# Patient Record
Sex: Female | Born: 1955
Health system: Southern US, Community
[De-identification: ages and names within clinical notes are randomized; demographics above are authoritative.]

## PROBLEM LIST (undated history)

## (undated) DIAGNOSIS — N951 Menopausal and female climacteric states: Secondary | ICD-10-CM

## (undated) DIAGNOSIS — R51 Headache: Secondary | ICD-10-CM

## (undated) DIAGNOSIS — J309 Allergic rhinitis, unspecified: Secondary | ICD-10-CM

## (undated) DIAGNOSIS — E785 Hyperlipidemia, unspecified: Secondary | ICD-10-CM

## (undated) DIAGNOSIS — M1990 Unspecified osteoarthritis, unspecified site: Secondary | ICD-10-CM

## (undated) DIAGNOSIS — E282 Polycystic ovarian syndrome: Secondary | ICD-10-CM

## (undated) DIAGNOSIS — C801 Malignant (primary) neoplasm, unspecified: Secondary | ICD-10-CM

## (undated) DIAGNOSIS — R609 Edema, unspecified: Secondary | ICD-10-CM

## (undated) DIAGNOSIS — T8859XA Other complications of anesthesia, initial encounter: Secondary | ICD-10-CM

## (undated) DIAGNOSIS — R112 Nausea with vomiting, unspecified: Secondary | ICD-10-CM

## (undated) DIAGNOSIS — R7303 Prediabetes: Secondary | ICD-10-CM

## (undated) DIAGNOSIS — R519 Headache, unspecified: Secondary | ICD-10-CM

## (undated) DIAGNOSIS — Z9889 Other specified postprocedural states: Secondary | ICD-10-CM

## (undated) DIAGNOSIS — I10 Essential (primary) hypertension: Secondary | ICD-10-CM

## (undated) DIAGNOSIS — E039 Hypothyroidism, unspecified: Secondary | ICD-10-CM

## (undated) DIAGNOSIS — F419 Anxiety disorder, unspecified: Secondary | ICD-10-CM

## (undated) DIAGNOSIS — T4145XA Adverse effect of unspecified anesthetic, initial encounter: Secondary | ICD-10-CM

## (undated) DIAGNOSIS — Z973 Presence of spectacles and contact lenses: Secondary | ICD-10-CM

## (undated) DIAGNOSIS — L719 Rosacea, unspecified: Secondary | ICD-10-CM

## (undated) HISTORY — DX: Hyperlipidemia, unspecified: E78.5

## (undated) HISTORY — PX: TONSILLECTOMY: SHX5217

## (undated) HISTORY — DX: Essential (primary) hypertension: I10

## (undated) HISTORY — DX: Anxiety disorder, unspecified: F41.9

## (undated) HISTORY — DX: Edema, unspecified: R60.9

## (undated) HISTORY — PX: FERTILITY SURGERY: SHX945

## (undated) HISTORY — PX: COLONOSCOPY: SHX174

## (undated) HISTORY — DX: Allergic rhinitis, unspecified: J30.9

## (undated) HISTORY — DX: Polycystic ovarian syndrome: E28.2

## (undated) HISTORY — PX: MOHS SURGERY: SUR867

## (undated) HISTORY — DX: Menopausal and female climacteric states: N95.1

---

## 1997-05-20 ENCOUNTER — Other Ambulatory Visit: Admission: RE | Admit: 1997-05-20 | Discharge: 1997-05-20 | Payer: Self-pay | Admitting: Obstetrics and Gynecology

## 1997-05-28 ENCOUNTER — Other Ambulatory Visit: Admission: RE | Admit: 1997-05-28 | Discharge: 1997-05-28 | Payer: Self-pay | Admitting: Obstetrics and Gynecology

## 1997-08-11 ENCOUNTER — Encounter: Admission: RE | Admit: 1997-08-11 | Discharge: 1997-11-09 | Payer: Self-pay | Admitting: Internal Medicine

## 1998-09-20 ENCOUNTER — Other Ambulatory Visit: Admission: RE | Admit: 1998-09-20 | Discharge: 1998-09-20 | Payer: Self-pay | Admitting: Obstetrics and Gynecology

## 1998-11-09 ENCOUNTER — Encounter: Admission: RE | Admit: 1998-11-09 | Discharge: 1998-11-09 | Payer: Self-pay | Admitting: Obstetrics and Gynecology

## 1998-11-09 ENCOUNTER — Encounter: Payer: Self-pay | Admitting: Obstetrics and Gynecology

## 1999-09-30 ENCOUNTER — Other Ambulatory Visit: Admission: RE | Admit: 1999-09-30 | Discharge: 1999-09-30 | Payer: Self-pay | Admitting: Obstetrics and Gynecology

## 1999-10-12 ENCOUNTER — Encounter: Admission: RE | Admit: 1999-10-12 | Discharge: 2000-01-10 | Payer: Self-pay | Admitting: Obstetrics and Gynecology

## 1999-11-15 ENCOUNTER — Encounter: Payer: Self-pay | Admitting: Obstetrics and Gynecology

## 1999-11-15 ENCOUNTER — Encounter: Admission: RE | Admit: 1999-11-15 | Discharge: 1999-11-15 | Payer: Self-pay | Admitting: Obstetrics and Gynecology

## 2000-11-08 ENCOUNTER — Other Ambulatory Visit: Admission: RE | Admit: 2000-11-08 | Discharge: 2000-11-08 | Payer: Self-pay | Admitting: Internal Medicine

## 2000-11-30 ENCOUNTER — Encounter: Admission: RE | Admit: 2000-11-30 | Discharge: 2000-11-30 | Payer: Self-pay | Admitting: Internal Medicine

## 2000-11-30 ENCOUNTER — Encounter: Payer: Self-pay | Admitting: Internal Medicine

## 2002-02-17 ENCOUNTER — Encounter: Payer: Self-pay | Admitting: Internal Medicine

## 2002-02-17 ENCOUNTER — Encounter: Admission: RE | Admit: 2002-02-17 | Discharge: 2002-02-17 | Payer: Self-pay | Admitting: Internal Medicine

## 2005-03-27 ENCOUNTER — Other Ambulatory Visit: Admission: RE | Admit: 2005-03-27 | Discharge: 2005-03-27 | Payer: Self-pay | Admitting: Family Medicine

## 2006-11-04 ENCOUNTER — Emergency Department (HOSPITAL_COMMUNITY): Admission: EM | Admit: 2006-11-04 | Discharge: 2006-11-04 | Payer: Self-pay | Admitting: Family Medicine

## 2006-11-06 ENCOUNTER — Emergency Department (HOSPITAL_COMMUNITY): Admission: EM | Admit: 2006-11-06 | Discharge: 2006-11-06 | Payer: Self-pay | Admitting: Emergency Medicine

## 2008-08-25 ENCOUNTER — Other Ambulatory Visit: Admission: RE | Admit: 2008-08-25 | Discharge: 2008-08-25 | Payer: Self-pay | Admitting: Family Medicine

## 2010-01-30 ENCOUNTER — Encounter: Payer: Self-pay | Admitting: Internal Medicine

## 2010-01-31 ENCOUNTER — Encounter: Payer: Self-pay | Admitting: Family Medicine

## 2010-09-05 ENCOUNTER — Other Ambulatory Visit: Payer: Self-pay | Admitting: Family Medicine

## 2010-09-05 ENCOUNTER — Other Ambulatory Visit (HOSPITAL_COMMUNITY)
Admission: RE | Admit: 2010-09-05 | Discharge: 2010-09-05 | Disposition: A | Discharge status: Home / Self Care | Payer: BC Managed Care – PPO | Source: Ambulatory Visit | Attending: Family Medicine | Admitting: Family Medicine

## 2010-09-05 DIAGNOSIS — Z124 Encounter for screening for malignant neoplasm of cervix: Secondary | ICD-10-CM | POA: Insufficient documentation

## 2010-09-05 DIAGNOSIS — Z1159 Encounter for screening for other viral diseases: Secondary | ICD-10-CM | POA: Insufficient documentation

## 2011-04-26 ENCOUNTER — Other Ambulatory Visit: Payer: Self-pay | Admitting: Dermatology

## 2011-05-29 ENCOUNTER — Other Ambulatory Visit: Payer: Self-pay | Admitting: Dermatology

## 2013-02-05 ENCOUNTER — Encounter: Payer: BC Managed Care – PPO | Attending: Endocrinology | Admitting: Dietician

## 2013-02-05 ENCOUNTER — Encounter: Payer: Self-pay | Admitting: Dietician

## 2013-02-05 VITALS — Ht 68.0 in | Wt 192.2 lb

## 2013-02-05 DIAGNOSIS — Z713 Dietary counseling and surveillance: Secondary | ICD-10-CM | POA: Insufficient documentation

## 2013-02-05 DIAGNOSIS — E663 Overweight: Secondary | ICD-10-CM | POA: Insufficient documentation

## 2013-02-05 NOTE — Progress Notes (Signed)
Medical Nutrition Therapy:  Appt start time: 8563 end time:  1200.  Assessment:  Primary concerns today: Sarah Blackwell is here today since her endocrinologist recommended that she see a dietitian. Has hypothyroidism and PCOS. States that she is "so tired" of extra weight. Has lost almost 10 lbs since the end of November and is currently at plateau for the past couple of weeks. Since November started doing regular exercise every day (45-90 minutes per day) and has a Physiological scientist she sees once per week. Made changes to diet such as stopped eating ice cream at night, avoids red meat most of the time, tried to stop eat as much bread and starches, and stopped drinking milk.  States that she has been in the pre-diabetes range off and on for years. Current Hgb A1c% is 5.6%. Has trouble getting below 189 lbs and gets discouraged when she works hard and doesn't lose more weight. Heaviest weight is about 205 lbs.   Is an "empty nester" and husband is working is [place] and she will visit at that time. Goes out to eat on the weekends about 10 times per week. Drinks wine during the week. States that she has a lot of stress in her life at this time.    Weight loss goal is 150 lbs or size 10.   Preferred Learning Style:   No preference indicated   Learning Readiness:   Ready   MEDICATIONS: see list   DIETARY INTAKE:  Avoided foods include red meat  24-hr recall:  B ( AM): 2-3 cups of coffee with cream and eggs with [place] bacon, Chobani Greek yogurt with granola, or fiber one and almond milk  Snk ( AM): none  L ( PM): Chobani yogurt or salad with pita bread or chicken salad with no bread or chicken and veggies with water or 1/2 sweet or unsweet tea with splenda Snk ( PM): fruit sometimes or cheese and crackers or skinny pop or avocado D ( PM):chicken/fish with vegetables or risotto/rice/sweet potato if goes out to dinner, sometimes pasta Snk ( PM): none usually, sometimes yogurt or  popcorn Beverages: water, coffee, sometimes tea, wine  Usual physical activity: exercise every day (45-90 minutes per day) and has a Physiological scientist she sees once per week.  Estimated energy needs: 1600 calories 180 g carbohydrates 120 g protein 44 g fat  Progress Towards Goal(s):  In progress.   Nutritional Diagnosis:  [place]-3.3 Overweight/obesity As related to history of large portion sizes and frequent restaurant meals.  As evidenced by BMI of 29.2.    Intervention:  Nutrition counseling provided. Discussed finding way to reduce stress to help with weight management. Also discussed counting carbohydrates to help manage portions and keep blood sugar from becoming elevated.   Plan: Aim to get 30-45 g carbohydrates at meals and up to 15 g at snacks if needed. For snacks and meals make sure to have protein with carbs. Start paying attention to hunger/fullness cues and try eat when you are just starting to get hungry. Try to eat meals at the table with no distractions (try to eat slowly). Consider exercising less per week if you have pain or it causes more stress.  Think about trying to incorporate more sleep if possible. Think about way to reduce stress if possible - schedule a massage!  Teaching Method Utilized:  Visual Auditory Hands on  Handouts given during visit include:  MyPlate Handout  15 g CHO Snacks  Yellow Card  Barriers to learning/adherence to lifestyle  change: busy schedule, stress  Demonstrated degree of understanding via:  Teach Back   Monitoring/Evaluation:  Dietary intake, exercise, mindful eating, and body weight prn.

## 2013-02-05 NOTE — Patient Instructions (Addendum)
Aim to get 30-45 g carbohydrates at meals and up to 15 g at snacks if needed. For snacks and meals make sure to have protein with carbs. Start paying attention to hunger/fullness cues and try eat when you are just starting to get hungry. Try to eat meals at the table with no distractions (try to eat slowly). Consider exercising less per week if you have pain or it causes more stress.  Think about trying to incorporate more sleep if possible. Think about way to reduce stress if possible - schedule a massage!

## 2013-08-25 ENCOUNTER — Encounter: Payer: Self-pay | Admitting: *Deleted

## 2014-01-20 ENCOUNTER — Other Ambulatory Visit: Payer: Self-pay | Admitting: Sports Medicine

## 2014-01-28 ENCOUNTER — Encounter: Payer: Self-pay | Admitting: Sports Medicine

## 2014-01-28 ENCOUNTER — Ambulatory Visit (INDEPENDENT_AMBULATORY_CARE_PROVIDER_SITE_OTHER): Payer: BLUE CROSS/BLUE SHIELD | Admitting: Sports Medicine

## 2014-01-28 ENCOUNTER — Ambulatory Visit
Admission: RE | Admit: 2014-01-28 | Discharge: 2014-01-28 | Disposition: A | Discharge status: Home / Self Care | Payer: BLUE CROSS/BLUE SHIELD | Source: Ambulatory Visit | Attending: Sports Medicine | Admitting: Sports Medicine

## 2014-01-28 VITALS — BP 117/75 | HR 76 | Ht 68.0 in | Wt 192.0 lb

## 2014-01-28 DIAGNOSIS — G8929 Other chronic pain: Secondary | ICD-10-CM | POA: Diagnosis not present

## 2014-01-28 DIAGNOSIS — M1611 Unilateral primary osteoarthritis, right hip: Secondary | ICD-10-CM | POA: Diagnosis not present

## 2014-01-28 DIAGNOSIS — M25551 Pain in right hip: Secondary | ICD-10-CM

## 2014-01-28 MED ORDER — TRAMADOL HCL 50 MG PO TABS: ORAL_TABLET | ORAL | Status: DC

## 2014-01-28 MED ORDER — AMITRIPTYLINE HCL 25 MG PO TABS: 25.0000 mg | ORAL_TABLET | Freq: Every day | ORAL | Status: DC

## 2014-01-28 NOTE — Assessment & Plan Note (Signed)
Consider options of CSI  Modify PT  Modify activity

## 2014-01-28 NOTE — Progress Notes (Signed)
Subjective:    Patient ID: Sarah Blackwell, female    DOB: [birth year], 57 y.o.   MRN: [id number]  HPI Sarah Blackwell is a 59 yo female who presents with right hip and leg pain, as well as left-sided low back pain.   The right hip and leg pain is described as involving the lateral and anterior hip region, the lateral thigh, lateral and inferior knee. The pain has been present for the past year intermittently, with acute worsening over the past three months. The pain is described as a tightness at times, throbbing at times, and ache at times. She has been working with Dr. Belia Heman performing PT prior to her appointment today. She has not noticed any improvement after PT, but does report that placing traction on her right leg seemed to increase the associated range of motion and decrease the pain. The pain is described as worsening with activity, including running, playing tennis, or snow skiing. The pain is improved slightly by placing pillows under her knees while sleeping at night. The pain is present at night and is described as a "throbbing" pain waking her from sleep at times. She reports subjective decreased ROM and weakness of her right leg. No instability. No numbness or tingling down into her feet. No recent injuries or surgeries, although she was a Therapist, sports in high school.   The left-sided low back pain started this morning while she was getting ready. She was standing at her bathroom sink and reports hearing a "popping" sound. Since that time she reports pain along the SI region and difficulty standing up straight. No radiation of the pain or associated numbness or tingling into the left hip or leg.    Review of Systems Negative other than noted in HPI.     Objective:   Physical Exam Vitals: BP 117/75 P76 General: well-appearing, pleasant female in no acute distress; she does appear uncomfortable with sitting  Back:  Inspection: vertebrate are midline; right SI joint is slightly  higher than left Palpation: mild tenderness to palpation along the left SI joint ROM: full ROM with flexion; the rest of the exam was limited by pain Special: negative straight leg raise bilaterally Neurovascular: neurovascularly intact distally; trace to 0 patellar and Achilles reflexes bilaterally  Lower Extremity:  Inspection: no soft tissue swelling, effusions, discoloration, or deformity; right hip appears internally rotated with walking; right pseudo leg-length discrepancy of approximately 0.5cm Palpation: mild tenderness to palpation along the left SI joint; mild but non-focal tenderness over the right greater trochanter ROM: internal rotation of right hip decreased to approximately 15 degrees, internal rotation of left hip approximately 30 degrees; right hip flexion limited to approximately 90 degrees, left hip flexion of approximately 110 degrees; full ROM at knee Special: positive FABER on rt, positive FADIR on rt, negative straight leg raise bilaterally, negative log roll bilaterally Neurovascular: neurovascularly intact distally; trace to 0 patellar and Achilles reflexes bilaterally     Assessment & Plan:   1. Right hip/leg pain:  Given the severity of the pain, location involving the anterior and lateral hip and decreased ROM with internal rotation and flexion at the hip, positive FABERs and FADIR there is concern for possible intra-articular pathology, such as osteoarthritis of the right hip. The pain that radiates down to the knee is like referred pain. There is possibly some mild bursitis contributing to the lateral hip pain, but this is not likely the sole cause of the pain.   - Will get hip radiographs to  assess for bony, intra-articular abnormalities and will call pt with results - Prescribed Tramadol 50mg  1-2 tablets up to 4 times per day for pain - Will start amitriptyline 25 mg 1 tablet at bedtime to help with sleep and to relax the muscles surrounding the hip  XRAYS  completed after OV.  These show moderate OA on RT with loss of joint space/  Mild OA on left  2. Left lower back pain:  The description of this pain starting acutely today, coupled with her current gait abnormality due to the right hip and leg pain make these symptoms most consistent with a popping of the SI joint secondary to the internal rotation occuring at her right hip.   - Will continue with plan as above  Follow-up in 4 weeks or sooner based on patient need or radiographic results. May need to perform ultrasound at next appointment.    This patient was seen with DR Oneida Alar and note was dictated by Crissie Sickles, MS4 Note I added XRAY results and edited note.  If she does not respond to tramadol and amitriptyline we may need to try CSI.  Ila Mcgill, MD

## 2014-01-28 NOTE — Assessment & Plan Note (Signed)
We will add tramadol and amitriptyline  Not much relief with 2 Aleve bid

## 2014-02-02 ENCOUNTER — Telehealth: Payer: Self-pay | Admitting: *Deleted

## 2014-02-02 NOTE — Telephone Encounter (Signed)
Pt called with questions about meds. Said the amitriptyline and tramadol is making her very drowsy/sleepy.  Also still has the pain and affects her ability to walk properly.  I recommended to take half the amitriptyline and to take the tramadol at night. Will check with Dr. Oneida Alar about what to suggest further.  Pt wants call back tmw if possible.

## 2014-02-17 ENCOUNTER — Other Ambulatory Visit: Payer: Self-pay | Admitting: Family Medicine

## 2014-02-17 DIAGNOSIS — N950 Postmenopausal bleeding: Secondary | ICD-10-CM

## 2014-02-19 ENCOUNTER — Ambulatory Visit
Admission: RE | Admit: 2014-02-19 | Discharge: 2014-02-19 | Disposition: A | Discharge status: Home / Self Care | Payer: BLUE CROSS/BLUE SHIELD | Source: Ambulatory Visit | Attending: Family Medicine | Admitting: Family Medicine

## 2014-02-19 DIAGNOSIS — N950 Postmenopausal bleeding: Secondary | ICD-10-CM

## 2014-02-24 ENCOUNTER — Ambulatory Visit: Payer: BLUE CROSS/BLUE SHIELD | Admitting: Sports Medicine

## 2014-02-26 ENCOUNTER — Ambulatory Visit: Payer: BLUE CROSS/BLUE SHIELD | Admitting: Sports Medicine

## 2014-03-10 ENCOUNTER — Encounter: Payer: Self-pay | Admitting: Sports Medicine

## 2014-03-10 ENCOUNTER — Ambulatory Visit (INDEPENDENT_AMBULATORY_CARE_PROVIDER_SITE_OTHER): Payer: BLUE CROSS/BLUE SHIELD | Admitting: Sports Medicine

## 2014-03-10 ENCOUNTER — Other Ambulatory Visit: Payer: Self-pay | Admitting: Sports Medicine

## 2014-03-10 VITALS — BP 129/89 | Ht 68.0 in | Wt 195.0 lb

## 2014-03-10 DIAGNOSIS — G8929 Other chronic pain: Secondary | ICD-10-CM | POA: Diagnosis not present

## 2014-03-10 DIAGNOSIS — M25551 Pain in right hip: Secondary | ICD-10-CM

## 2014-03-10 DIAGNOSIS — M1611 Unilateral primary osteoarthritis, right hip: Secondary | ICD-10-CM

## 2014-03-10 NOTE — Assessment & Plan Note (Signed)
Moderate to severe with subchondral cyst. -Referred for Fluoroscopy guided CST right hip injection -Continue HEP -Continue amitriptyline 25mg  -Plan f/u in 4-6 weeks for re-evaluation -If sx do not temporarily improve following hip CST injection, consider looking at lumbar spine as potential etiology of patient's symptoms.

## 2014-03-10 NOTE — Progress Notes (Signed)
   Subjective:    Patient ID: Sarah Blackwell, female    DOB: May 13, 1955, 59 y.o.   MRN: [id number]  HPI [name] is a 59 yo female who presents with right hip pain. She was last seen by Dr. Oneida Alar on 01/28/14 and started on a home exercise program and amitriptyline 25mg  po qHS. She says her sx have persisted, located in the groin and deep right gluteal region. Also she is having some mild right-sided low back pain with some radicular symptoms to the right knee. Onset was over a year ago. She enjoys playing tennis but has been limited due to the hip pain. No known hx of acute injury. Character is throbbing deep pain. No catching or mechanical symptoms. Worse with right leg abduction feeling like a "pinching" in the right hip. The pain is improved slightly by placing pillows under her knees while sleeping at night. She reports subjective decreased ROM and weakness of her right leg. No instability. No numbness or tingling down into her feet.   X-rays on 01/24/14: Moderate Right greater than Left hip OA, subchondral cyst in right femoral-acetabular joint.  Past medical history, social history, medications, and allergies were reviewed and are up to date in the chart.  Review of Systems 7 point review of systems was performed and was otherwise negative unless noted in the history of present illness.     Objective:   Physical Exam BP 129/89 mmHg  Ht 5\' 8"  (1.727 m)  Wt 195 lb (88.451 kg)  BMI 29.66 kg/m2 GEN: The patient is well-developed well-nourished female and in no acute distress.  She is awake alert and oriented x3. SKIN: warm and well-perfused, no rash  EXTR: No lower extremity edema or calf tenderness Neuro: Strength 5/5 globally. Sensation intact throughout. No focal deficits. Vasc: +2 bilateral distal pulses. No edema.  MSK: Examination of the right hip reveals limited internal ROM to about 20 degrees with significant reproduction of pain at the end point of motion. Left hip internal  ROM is to about 30 degrees without pain. No catching or popping. No leg length discrepancy. +Faber right hip. Tone and strength intact in the right lower extremity. Negative pelvic compression test.    Assessment & Plan:  Please see problem based assessment and plan in the problem list.

## 2014-03-11 ENCOUNTER — Ambulatory Visit
Admission: RE | Admit: 2014-03-11 | Discharge: 2014-03-11 | Disposition: A | Discharge status: Home / Self Care | Payer: BLUE CROSS/BLUE SHIELD | Source: Ambulatory Visit | Attending: Sports Medicine | Admitting: Sports Medicine

## 2014-03-11 DIAGNOSIS — M25551 Pain in right hip: Secondary | ICD-10-CM

## 2014-03-11 MED ORDER — IOHEXOL 350 MG/ML SOLN
1.0000 mL | Freq: Once | INTRAVENOUS | Status: AC | PRN
  Administered 2014-03-11: 1 mL via INTRA_ARTICULAR

## 2014-03-11 MED ORDER — METHYLPREDNISOLONE ACETATE 40 MG/ML INJ SUSP (RADIOLOG
120.0000 mg | Freq: Once | INTRAMUSCULAR | Status: AC
  Administered 2014-03-11: 120 mg via INTRA_ARTICULAR

## 2014-03-11 NOTE — Discharge Instructions (Signed)
Disc Aspiration/Bone Biopsy Post Procedure Discharge Instructions  1. You may resume a regular diet and any medications that you routinely take (including pain medications). 2. No driving the day of procedure. 3. Upon discharge go home and rest for at least 4 hours.  You may use an ice pack as needed to injection sites on back. 4. Remove bandaids later today.    Please contact our office at [phone] for the following symptoms:   Fever greater than 100 degrees  Increased swelling, pain, or redness at injection site.   Thank you for visiting [facility].

## 2014-03-13 ENCOUNTER — Ambulatory Visit: Payer: BLUE CROSS/BLUE SHIELD | Admitting: Family Medicine

## 2014-04-07 ENCOUNTER — Ambulatory Visit (INDEPENDENT_AMBULATORY_CARE_PROVIDER_SITE_OTHER): Payer: BLUE CROSS/BLUE SHIELD | Admitting: Sports Medicine

## 2014-04-07 ENCOUNTER — Encounter: Payer: Self-pay | Admitting: Sports Medicine

## 2014-04-07 VITALS — BP 140/81 | HR 73 | Ht 68.0 in | Wt 195.0 lb

## 2014-04-07 DIAGNOSIS — M5416 Radiculopathy, lumbar region: Secondary | ICD-10-CM

## 2014-04-07 DIAGNOSIS — M1611 Unilateral primary osteoarthritis, right hip: Secondary | ICD-10-CM

## 2014-04-07 DIAGNOSIS — M5417 Radiculopathy, lumbosacral region: Secondary | ICD-10-CM

## 2014-04-07 MED ORDER — DIAZEPAM 5 MG PO TABS: ORAL_TABLET | ORAL | Status: DC

## 2014-04-08 ENCOUNTER — Ambulatory Visit
Admission: RE | Admit: 2014-04-08 | Discharge: 2014-04-08 | Disposition: A | Discharge status: Home / Self Care | Payer: BLUE CROSS/BLUE SHIELD | Source: Ambulatory Visit | Attending: Sports Medicine | Admitting: Sports Medicine

## 2014-04-08 DIAGNOSIS — M1611 Unilateral primary osteoarthritis, right hip: Secondary | ICD-10-CM

## 2014-04-08 NOTE — Progress Notes (Signed)
  Sarah Blackwell - 59 y.o. female MRN [id number]  Date of birth: March 27, 1955  SUBJECTIVE:  Including CC & ROS.  Sarah Blackwell is a pleasant 59 yo female present today for follow up for persistent right side hip pain, low back and lateral radiating pain.  Patient was seen twice this month for these symptoms. Initially mostly of her pain was in the groin and lateral leg she was thought to be dealing with intra-articular arthritis in the hip, she had xray that only showed mild degenerative changes.  Patient was sent for cortisone injection under floro and reports a great reports initially for the first 48 hours but the pain in her buttock and low back were still present. The pain has gradually worsened since the injection back to the original pain level within a weeks time.  Today she continue to describes a C-shape distribution of pain from the posterior Gluteal fold wrapping lateral down thigh and anterior thigh.  The pain is severe enough that it has cause her to change her gait and makes it difficulty to walk at times. Worse with getting up from a sited position.  The amitriptyline and tramadol have helped some.  Continues to not describe a numbness but a shooting type pain.    ROS: Review of systems otherwise negative except for information present in HPI  HISTORY: Past Medical, Surgical, Social, and Family History Reviewed & Updated per EMR. Pertinent Historical Findings include: Recent hip injection No other changes in medical history since last seen  DATA REVIEWED: Hip xrays review with mild to moderate OA  PHYSICAL EXAM:  VS: BP:140/81 mmHg  HR:73bpm  TEMP: ( )  RESP:   HT:5\' 8"  (172.7 cm)   WT:195 lb (88.451 kg)  BMI:29.7 LOW BACK EXAM: General: well nourished Skin of LE: warm; dry, no rashes, lesions, ecchymosis or erythema. Vascular: radial pulses 2+ bilaterally Neurologically: Sensation to light touch lower extremities equal and intact bilaterally.  Observation: Normal  curvature and no kyphosis or lordosis, no scoliosis.  Iliac crests are symmetric, shoulders line symmetrically Palpation:  No step off defects noted in the thoracic or lumbar spine.   No muscle spasm or tenderness along the paraspinal musculature of the thoracic Moderate muscle tenderness along the paraspinal musculature of the lumbar spine. Range of motion:  Decrease flexion on forward bending, yes pain with extension Neuromuscular: moderate pain with straight leg raise on right, antalagic gait  To pain with hip grip with IR and ER mostly cause buttock pain  Nerve root intervention L1, L2 and L3: 4/5 in hip flexion on right versa 5/5 left L1, L2, L3, L4: 4/5 hip abduction versa 5/5 left L4, L5, S1: Normal hip adduction bilaterally S1 and S2: Normal ankle plantar-flexion bilaterally.   L5: Normal extensor hallucis longus bilaterally  ASSESSMENT & PLAN: See problem based charting & AVS for pt instructions. Impression: Suspect lumbar degenerative disc disease as the cause of hip and lateral leg pain   Recommendations: - proceed with lumbar xray and MRI given the distribution of pain, poor response to hip injection, radiculopathy in right leg, and length of symptoms -Restart PT for strengthening of lumbar spine  -Other activities as tolerated  -Continue amitriptyline and tramadol for pain control  We spent greater than 50% of our 30 minute office visit in counseling education regarding the patient current clinical problem, risks and benefits of treatment options, and recommend plans for treatment or further evaluation

## 2014-04-09 ENCOUNTER — Telehealth: Payer: Self-pay | Admitting: Sports Medicine

## 2014-04-09 DIAGNOSIS — M5136 Other intervertebral disc degeneration, lumbar region: Secondary | ICD-10-CM | POA: Insufficient documentation

## 2014-04-09 DIAGNOSIS — M5416 Radiculopathy, lumbar region: Secondary | ICD-10-CM

## 2014-04-09 NOTE — Telephone Encounter (Signed)
Spoke with patient this morning concerning x-ray results and further discussion about back MRI. Advised patient that based on her x-ray results upper lumbar spine she has multilevel degenerative changes particularly concerning is the disc space narrowing at L1-L2 and L2-L3. With the amount of disc space narrowing at these levels there is likely a high likelihood of possible herniation of the disc that is the source of her radicular symptoms by causing compression on the L1-L2 nerve roots. This also is consistent with the distribution of her pain which extends from her low back into her right buttocks, right thigh and around her right hip. -Educated patient that getting a hip MRI at this point is likely not be beneficial given that she had poor improvement from the hip injection, her x-rays of her hip are not as severe as the arthritis in her back, and she is describing more radicular symptoms than arthritic symptoms. -Also educated patient that we are not able to do both hip and lumbar MRI in the same MRI exam. Patient verbalized understanding and will plan to get MRI of her back tomorrow as scheduled. We'll discuss those results over the phone. Informed patient that based on those results will recommend possible ESI versus referral to neurosurgery.

## 2014-04-10 ENCOUNTER — Ambulatory Visit
Admission: RE | Admit: 2014-04-10 | Discharge: 2014-04-10 | Disposition: A | Discharge status: Home / Self Care | Payer: BLUE CROSS/BLUE SHIELD | Source: Ambulatory Visit | Attending: Sports Medicine | Admitting: Sports Medicine

## 2014-04-10 DIAGNOSIS — M1611 Unilateral primary osteoarthritis, right hip: Secondary | ICD-10-CM

## 2014-04-14 ENCOUNTER — Telehealth: Payer: Self-pay | Admitting: Sports Medicine

## 2014-04-14 ENCOUNTER — Ambulatory Visit: Payer: BLUE CROSS/BLUE SHIELD | Admitting: Sports Medicine

## 2014-04-14 DIAGNOSIS — M5136 Other intervertebral disc degeneration, lumbar region: Secondary | ICD-10-CM

## 2014-04-14 DIAGNOSIS — M1611 Unilateral primary osteoarthritis, right hip: Secondary | ICD-10-CM

## 2014-04-14 DIAGNOSIS — M25551 Pain in right hip: Principal | ICD-10-CM

## 2014-04-14 DIAGNOSIS — G8929 Other chronic pain: Secondary | ICD-10-CM

## 2014-04-14 DIAGNOSIS — M5416 Radiculopathy, lumbar region: Secondary | ICD-10-CM

## 2014-04-15 ENCOUNTER — Other Ambulatory Visit: Payer: Self-pay | Admitting: *Deleted

## 2014-04-15 MED ORDER — PREDNISONE 10 MG PO TABS: ORAL_TABLET | ORAL | Status: DC

## 2014-04-15 NOTE — Telephone Encounter (Signed)
Spoke patient over the phone about lumbar MRI, back xray and hip xray results after discussing with Dr. Oneida Alar. She clearly have degenerative changes in both areas. With little improve after hip injection and tramadol for pain. It continues to be difficulty to determine what percentage of pain is coming from the back or the hip. Plan at this point is to due a 10 day prednisone taper and f/u in the office with Dr. Oneida Alar and myself in Tuesday to re-assess. If improve its likely arthritic pain if no improvement we likely need to proceed with hip MRI.

## 2014-04-16 ENCOUNTER — Other Ambulatory Visit: Payer: Self-pay | Admitting: Sports Medicine

## 2014-04-21 ENCOUNTER — Ambulatory Visit (INDEPENDENT_AMBULATORY_CARE_PROVIDER_SITE_OTHER): Payer: BLUE CROSS/BLUE SHIELD | Admitting: Sports Medicine

## 2014-04-21 ENCOUNTER — Encounter: Payer: Self-pay | Admitting: Sports Medicine

## 2014-04-21 VITALS — BP 97/86 | HR 70 | Ht 68.0 in | Wt 195.0 lb

## 2014-04-21 DIAGNOSIS — M1611 Unilateral primary osteoarthritis, right hip: Secondary | ICD-10-CM | POA: Diagnosis not present

## 2014-04-21 MED ORDER — PREDNISONE 10 MG PO TABS: ORAL_TABLET | ORAL | Status: DC

## 2014-04-21 MED ORDER — NAPROXEN 500 MG PO TABS: 500.0000 mg | ORAL_TABLET | Freq: Two times a day (BID) | ORAL | Status: DC

## 2014-04-21 NOTE — Patient Instructions (Addendum)
Start taking prednisone 2 tablets (20 mg) daily in the morning with food. Take this for a total of 10 days then stop. Please call the office to indicate your response to this medication during this 10 day period.  Start naproxen and (similar to Aleve) this is 500 mg tablets by mouth twice a day  Started biking or aerobic work in the pool for additional physical activity.

## 2014-04-22 NOTE — Progress Notes (Signed)
Sarah Blackwell - 59 y.o. female MRN [id number]  Date of birth: 1955-06-21  SUBJECTIVE:  Including CC & ROS.  Sarah Blackwell is a pleasant 59 yo female present today for follow up for persistent right side hip pain, low back and lateral leg  pain.  Patient was seen three time this past month for these symptoms.  Initially mostly of her pain was in the groin and lateral leg she was thought to be dealing with intra-articular arthritis in the hip, she has limited ROM of exam and she had xray that only showed moderate degenerative changes.  Patient was sent for intra-articular right hip cortisone injection under floro and reports a great reports initially for the first 48 hours but over two week the pain gradually worsened back to the original pain level. Patient was seen again by myself and with complaining of  a C-shape distribution of pain from the posterior Gluteal fold wrapping lateral down thigh and anterior thigh. The pain is severe enough that it has cause her to change her gait and makes it difficulty to walk at times.  Given her poor response to hip injection we looking further in to the lumbar spine as the cause of her pain. 2 view lumbar xray showed degenerative changes at L2-L3 and L3-4 which can discribute to the hip and thus was sent for MRI.  Lumbar MRI showed diffuse annular bulse at L2-L3 with bilateral recess encroachment and mild Left L2 nerve root encroachment but no right side.  Patient was sent in a 10day course of prednisone to try to calm down arthritic pain suspected to be coming from the hip.   Today the patient report she only took one day of prednisone due to manic symptoms and she also had rolfing treatment soft tissue manipulation that was very painful for the first 48 hours but has help in the past 3 days.  Continues to localize pain in a C-shape distribution of pain and groin pain. Describes feeling like something sharp is stuck in her hip joint.   ROS: Review of systems  otherwise negative except for information present in HPI  HISTORY: Past Medical, Surgical, Social, and Family History Reviewed & Updated per EMR. Pertinent Historical Findings include: Recent hip injection No other changes in medical history since last seen  DATA REVIEWED: Hip xrays review with mild to moderate OA right greater than left  Lumbar xray: Degenerative disc and facet disease diffusely throughout the lumbar spine. No acute bony abnormality  Lumbar MRI: 1. Diffuse annular bulge, mild osteophytic ridging and facet disease at L2-3 with mild bilateral lateral recess encroachment. There is also mild extra foraminal encroachment on the left L2 nerve root. 2. Severe facet disease in the lower lumbar spine. There may be a the unilateral pars defect on the right at L5.  PHYSICAL EXAM:  VS: BP:97/86 mmHg  HR:70bpm  TEMP: ( )  RESP:   HT:5\' 8"  (172.7 cm)   WT:195 lb (88.451 kg)  BMI:29.7 LOW BACK EXAM: General: well nourished Skin of LE: warm; dry, no rashes, lesions, ecchymosis or erythema. Vascular: radial pulses 2+ bilaterally Neurologically: Sensation to light touch lower extremities equal and intact bilaterally.  Observation: Normal curvature and no kyphosis or lordosis, no scoliosis.  Iliac crests are symmetric, shoulders line symmetrically Range of motion:  Hip ROM: Flex Right - 90/ left-100, IR right -30/ left 45, ER right 15/ left 30 Positive FADIR for pain and limitation and motion Decrease flexion on forward bending, yes pain with extension  Neuromuscular: moderate pain with straight leg raise on right, antalagic gait  To pain with hip grip with IR and ER causing groin and buttock pain  Motor and sensory function intact  ASSESSMENT & PLAN: See problem based charting & AVS for pt instructions. Impression: Right hip OA   Recommendations: - Restart prednisone 20mg  daily for 10 days then stop and call office to notify us of response -After complete prednisone  start Naproxen 500mg  BID for pain and OA control  -Restart PT for strengthening avoiding activities that cause pain -Other activities as tolerated

## 2014-04-26 ENCOUNTER — Other Ambulatory Visit: Payer: BLUE CROSS/BLUE SHIELD

## 2014-05-15 ENCOUNTER — Encounter: Payer: Self-pay | Admitting: Sports Medicine

## 2014-05-15 ENCOUNTER — Ambulatory Visit (INDEPENDENT_AMBULATORY_CARE_PROVIDER_SITE_OTHER): Payer: BLUE CROSS/BLUE SHIELD | Admitting: Sports Medicine

## 2014-05-15 VITALS — BP 123/73 | HR 75 | Ht 68.0 in | Wt 195.0 lb

## 2014-05-15 DIAGNOSIS — M1611 Unilateral primary osteoarthritis, right hip: Secondary | ICD-10-CM | POA: Diagnosis not present

## 2014-05-15 NOTE — Progress Notes (Signed)
Patient ID: Sarah Blackwell, female   DOB: Jan 09, 1956, 59 y.o.   MRN: [id number]  We have been following this patient since January 20 with symptoms that seem to be from right osteoarthritis of the hip This is fairly severe on x-ray She is more limited with walking or standing She does get some pain relief with Naprosyn She gets some relief with tramadol but does not like side effects  She had some low back pain but an MRI of that area did not suggest this to be the primary cause  She comes today for advice about whether to consider surgery or other treatments  Examination Pleasant and in no acute distress BP 123/73 mmHg  Pulse 75  Ht 5\' 8"  (1.727 m)  Wt 195 lb (88.451 kg)  BMI 29.66 kg/m2  Range of motion of the right hip is significantly limited Seated internal rotation is only 5 External rotation of only about 30 Hip flexion becomes painful at 120  Left hip she can flex the knee to her chest She has 25-30 of internal rotation on the left and 40 of external rotation  X-rays show osteoarthritis as noted

## 2014-05-15 NOTE — Assessment & Plan Note (Signed)
Pain and the inability to walk well are. Significant limitations for her  I think she would like to go ahead and see a surgeon for opinion about replacement  I discussed with her the anterior approach which has had rapid recovery in a number of my patients  We will send her to Dr. Ninfa Linden for his opinion and evaluation  I think she is a good candidate for total hip replacement on the right

## 2014-05-15 NOTE — Patient Instructions (Signed)
[facility] Jean Rosenthal, MD Thursday May 12th at 415pm Arrival time is 4pm [address], [place], [place] [address] [phone] Fax: [phone]

## 2014-05-25 ENCOUNTER — Other Ambulatory Visit: Payer: Self-pay | Admitting: Orthopaedic Surgery

## 2014-05-25 DIAGNOSIS — M25551 Pain in right hip: Secondary | ICD-10-CM

## 2014-05-29 ENCOUNTER — Ambulatory Visit
Admission: RE | Admit: 2014-05-29 | Discharge: 2014-05-29 | Disposition: A | Discharge status: Home / Self Care | Payer: BLUE CROSS/BLUE SHIELD | Source: Ambulatory Visit | Attending: Orthopaedic Surgery | Admitting: Orthopaedic Surgery

## 2014-05-29 DIAGNOSIS — M25551 Pain in right hip: Secondary | ICD-10-CM

## 2014-06-15 ENCOUNTER — Other Ambulatory Visit (HOSPITAL_COMMUNITY): Payer: Self-pay | Admitting: Orthopaedic Surgery

## 2014-06-16 ENCOUNTER — Other Ambulatory Visit (HOSPITAL_COMMUNITY): Payer: Self-pay | Admitting: Orthopaedic Surgery

## 2014-06-17 ENCOUNTER — Encounter (HOSPITAL_COMMUNITY)
Admission: RE | Admit: 2014-06-17 | Discharge: 2014-06-17 | Disposition: A | Discharge status: Home / Self Care | Payer: BLUE CROSS/BLUE SHIELD | Source: Ambulatory Visit | Attending: Orthopaedic Surgery | Admitting: Orthopaedic Surgery

## 2014-06-17 ENCOUNTER — Other Ambulatory Visit (HOSPITAL_COMMUNITY): Payer: Self-pay | Admitting: *Deleted

## 2014-06-17 ENCOUNTER — Encounter (HOSPITAL_COMMUNITY): Payer: Self-pay

## 2014-06-17 DIAGNOSIS — M1611 Unilateral primary osteoarthritis, right hip: Secondary | ICD-10-CM | POA: Diagnosis not present

## 2014-06-17 DIAGNOSIS — Z01812 Encounter for preprocedural laboratory examination: Secondary | ICD-10-CM | POA: Insufficient documentation

## 2014-06-17 DIAGNOSIS — R001 Bradycardia, unspecified: Secondary | ICD-10-CM | POA: Insufficient documentation

## 2014-06-17 HISTORY — DX: Rosacea, unspecified: L71.9

## 2014-06-17 HISTORY — DX: Other complications of anesthesia, initial encounter: T88.59XA

## 2014-06-17 HISTORY — DX: Adverse effect of unspecified anesthetic, initial encounter: T41.45XA

## 2014-06-17 HISTORY — DX: Unspecified osteoarthritis, unspecified site: M19.90

## 2014-06-17 HISTORY — DX: Malignant (primary) neoplasm, unspecified: C80.1

## 2014-06-17 HISTORY — DX: Prediabetes: R73.03

## 2014-06-17 HISTORY — DX: Hypothyroidism, unspecified: E03.9

## 2014-06-17 HISTORY — DX: Headache: R51

## 2014-06-17 HISTORY — DX: Headache, unspecified: R51.9

## 2014-06-17 LAB — BASIC METABOLIC PANEL
ANION GAP: 8 (ref 5–15)
BUN: 14 mg/dL (ref 6–20)
CO2: 27 mmol/L (ref 22–32)
Calcium: 9.5 mg/dL (ref 8.9–10.3)
Chloride: 106 mmol/L (ref 101–111)
Creatinine, Ser: 0.7 mg/dL (ref 0.44–1.00)
GFR calc Af Amer: >60 mL/min (ref >60)
GFR calc non Af Amer: >60 mL/min (ref >60)
Glucose, Bld: 109 mg/dL — ABNORMAL HIGH (ref 65–99)
Potassium: 4.7 mmol/L (ref 3.5–5.1)
SODIUM: 141 mmol/L (ref 135–145)

## 2014-06-17 LAB — SURGICAL PCR SCREEN
MRSA, PCR: POSITIVE — AB
Staphylococcus aureus: POSITIVE — AB

## 2014-06-17 LAB — CBC
HCT: 38.3 % (ref 36.0–46.0)
HEMOGLOBIN: 12.9 g/dL (ref 12.0–15.0)
MCH: 30.9 pg (ref 26.0–34.0)
MCHC: 33.7 g/dL (ref 30.0–36.0)
MCV: 91.8 fL (ref 78.0–100.0)
Platelets: 176 10*3/uL (ref 150–400)
RBC: 4.17 MIL/uL (ref 3.87–5.11)
RDW: 12.3 % (ref 11.5–15.5)
WBC: 2.9 10*3/uL — AB (ref 4.0–10.5)

## 2014-06-17 NOTE — Progress Notes (Signed)
Mupirocin Ointment Rx called into [facility] [employer] on [address] for positive PCR of MRSA and Staph. Pt notified and voiced understanding.

## 2014-06-17 NOTE — Progress Notes (Signed)
Called Dr. Abigail Butts [facility] office, pt's PCP, for copy of EKG, stress test and recent Hgb A1C.   Pt did not sign consent form today, she felt that she had not had all her questions answered by Dr. Ninfa Linden. She also understood that she was having the anterior approach for her hip replacement. That was not part of the consent order. Called Dr. Trevor Mace office and spoke with Almedia Balls, she states that they never put "anterior approach" in the consent order.

## 2014-06-17 NOTE — Pre-Procedure Instructions (Signed)
Sarah Blackwell  06/17/2014     Your procedure is scheduled on  Tuesday, June 30, 2014 at 3:00 PM.   Report to [facility] Entrance "A" Admitting Office at 1:00 PM.   Call this number if you have problems the morning of surgery: [phone]   Any questions prior to day of surgery, please call [phone] between 8 & 4 PM.    Remember:  Do not eat food or drink liquids after midnight Monday, 06/29/14.   Take these medicines the morning of surgery with A SIP OF WATER:  Fexofenadine (Allegra), Levothyroxine (Synthroid), Liothyronine (Cytomel), Nasocort nasal inhaler, Xanax - if needed  Stop Fish Oil, Herbal medications and NSAIDS (Naproxen, Aleve, Ibuprofen, etc) 7 days prior to surgery.   Do not wear jewelry, make-up or nail polish.  Do not wear lotions, powders, or perfumes.  You may wear deodorant.  Do not shave 48 hours prior to surgery.    Do not bring valuables to the hospital.  [facility] is not responsible for any belongings or valuables.  Contacts, dentures or bridgework may not be worn into surgery.  Leave your suitcase in the car.  After surgery it may be brought to your room.  For patients admitted to the hospital, discharge time will be determined by your treatment team.  Special instructions:  [facility] - Preparing for Surgery  Before surgery, you can play an important role.  Because skin is not sterile, your skin needs to be as free of germs as possible.  You can reduce the number of germs on you skin by washing with CHG (chlorahexidine gluconate) soap before surgery.  CHG is an antiseptic cleaner which kills germs and bonds with the skin to continue killing germs even after washing.  Please DO NOT use if you have an allergy to CHG or antibacterial soaps.  If your skin becomes reddened/irritated stop using the CHG and inform your nurse when you arrive at Short Stay.  Do not shave (including legs and underarms) for at least 48 hours prior to the first CHG  shower.  You may shave your face.  Please follow these instructions carefully:   1.  Shower with CHG Soap the night before surgery and the                                morning of Surgery.  2.  If you choose to wash your hair, wash your hair first as usual with your       normal shampoo.  3.  After you shampoo, rinse your hair and body thoroughly to remove the                      Shampoo.  4.  Use CHG as you would any other liquid soap.  You can apply chg directly       to the skin and wash gently with scrungie or a clean washcloth.  5.  Apply the CHG Soap to your body ONLY FROM THE NECK DOWN.        Do not use on open wounds or open sores.  Avoid contact with your eyes, ears, mouth and genitals (private parts).  Wash genitals (private parts) with your normal soap.  6.  Wash thoroughly, paying special attention to the area where your surgery        will be performed.  7.  Thoroughly rinse your body  with warm water from the neck down.  8.  DO NOT shower/wash with your normal soap after using and rinsing off       the CHG Soap.  9.  Pat yourself dry with a clean towel.            10.  Wear clean pajamas.            11.  Place clean sheets on your bed the night of your first shower and do not        sleep with pets.  Day of Surgery  Do not apply any lotions the morning of surgery.  Please wear clean clothes to the hospital.    Please read over the following fact sheets that you were given. Pain Booklet, Coughing and Deep Breathing, MRSA Information and Surgical Site Infection Prevention

## 2014-06-29 MED ORDER — CEFAZOLIN SODIUM-DEXTROSE 2-3 GM-% IV SOLR
2.0000 g | INTRAVENOUS | Status: AC
  Administered 2014-06-30: 2 g via INTRAVENOUS
  Filled 2014-06-29: qty 50

## 2014-06-29 NOTE — Progress Notes (Signed)
Patient called and new arrival time of 12 noon.  DA

## 2014-06-30 ENCOUNTER — Inpatient Hospital Stay (HOSPITAL_COMMUNITY): Payer: BLUE CROSS/BLUE SHIELD | Admitting: Anesthesiology

## 2014-06-30 ENCOUNTER — Inpatient Hospital Stay (HOSPITAL_COMMUNITY)
Admission: RE | Admit: 2014-06-30 | Discharge: 2014-07-02 | DRG: 470 | Disposition: A | Discharge status: Home Health | Payer: BLUE CROSS/BLUE SHIELD | Source: Ambulatory Visit | Attending: Orthopaedic Surgery | Admitting: Orthopaedic Surgery

## 2014-06-30 ENCOUNTER — Encounter (HOSPITAL_COMMUNITY): Payer: Self-pay | Admitting: *Deleted

## 2014-06-30 ENCOUNTER — Encounter (HOSPITAL_COMMUNITY)
Admission: RE | Disposition: A | Discharge status: Home Health | Payer: Self-pay | Source: Ambulatory Visit | Attending: Orthopaedic Surgery

## 2014-06-30 ENCOUNTER — Inpatient Hospital Stay (HOSPITAL_COMMUNITY): Payer: BLUE CROSS/BLUE SHIELD

## 2014-06-30 DIAGNOSIS — E785 Hyperlipidemia, unspecified: Secondary | ICD-10-CM | POA: Diagnosis present

## 2014-06-30 DIAGNOSIS — E039 Hypothyroidism, unspecified: Secondary | ICD-10-CM | POA: Diagnosis present

## 2014-06-30 DIAGNOSIS — R7309 Other abnormal glucose: Secondary | ICD-10-CM | POA: Diagnosis present

## 2014-06-30 DIAGNOSIS — M25551 Pain in right hip: Secondary | ICD-10-CM | POA: Diagnosis present

## 2014-06-30 DIAGNOSIS — Z79899 Other long term (current) drug therapy: Secondary | ICD-10-CM

## 2014-06-30 DIAGNOSIS — D62 Acute posthemorrhagic anemia: Secondary | ICD-10-CM | POA: Diagnosis not present

## 2014-06-30 DIAGNOSIS — M1611 Unilateral primary osteoarthritis, right hip: Secondary | ICD-10-CM | POA: Diagnosis present

## 2014-06-30 DIAGNOSIS — Z823 Family history of stroke: Secondary | ICD-10-CM

## 2014-06-30 DIAGNOSIS — L719 Rosacea, unspecified: Secondary | ICD-10-CM | POA: Diagnosis present

## 2014-06-30 DIAGNOSIS — F419 Anxiety disorder, unspecified: Secondary | ICD-10-CM | POA: Diagnosis present

## 2014-06-30 DIAGNOSIS — Z882 Allergy status to sulfonamides status: Secondary | ICD-10-CM | POA: Diagnosis not present

## 2014-06-30 DIAGNOSIS — Z7982 Long term (current) use of aspirin: Secondary | ICD-10-CM | POA: Diagnosis not present

## 2014-06-30 DIAGNOSIS — Z8249 Family history of ischemic heart disease and other diseases of the circulatory system: Secondary | ICD-10-CM

## 2014-06-30 DIAGNOSIS — Z811 Family history of alcohol abuse and dependence: Secondary | ICD-10-CM | POA: Diagnosis not present

## 2014-06-30 DIAGNOSIS — E282 Polycystic ovarian syndrome: Secondary | ICD-10-CM | POA: Diagnosis present

## 2014-06-30 DIAGNOSIS — I10 Essential (primary) hypertension: Secondary | ICD-10-CM | POA: Diagnosis present

## 2014-06-30 DIAGNOSIS — Z96641 Presence of right artificial hip joint: Secondary | ICD-10-CM

## 2014-06-30 DIAGNOSIS — Z419 Encounter for procedure for purposes other than remedying health state, unspecified: Secondary | ICD-10-CM

## 2014-06-30 HISTORY — PX: TOTAL HIP ARTHROPLASTY: SHX124

## 2014-06-30 SURGERY — ARTHROPLASTY, HIP, TOTAL, ANTERIOR APPROACH
Anesthesia: Monitor Anesthesia Care | Site: Hip | Laterality: Right

## 2014-06-30 MED ORDER — ONDANSETRON HCL 4 MG PO TABS: 4.0000 mg | ORAL_TABLET | Freq: Four times a day (QID) | ORAL | Status: DC | PRN

## 2014-06-30 MED ORDER — MIDAZOLAM HCL 5 MG/5ML IJ SOLN
INTRAMUSCULAR | Status: DC | PRN
  Administered 2014-06-30: 2 mg via INTRAVENOUS

## 2014-06-30 MED ORDER — MIDAZOLAM HCL 2 MG/2ML IJ SOLN
INTRAMUSCULAR | Status: AC
  Filled 2014-06-30: qty 2

## 2014-06-30 MED ORDER — DOCUSATE SODIUM 100 MG PO CAPS
100.0000 mg | ORAL_CAPSULE | Freq: Two times a day (BID) | ORAL | Status: DC
  Administered 2014-06-30 – 2014-07-02 (×4): 100 mg via ORAL
  Filled 2014-06-30 (×4): qty 1

## 2014-06-30 MED ORDER — 0.9 % SODIUM CHLORIDE (POUR BTL) OPTIME
TOPICAL | Status: DC | PRN
  Administered 2014-06-30: 1000 mL

## 2014-06-30 MED ORDER — ROCURONIUM BROMIDE 50 MG/5ML IV SOLN
INTRAVENOUS | Status: AC
  Filled 2014-06-30: qty 1

## 2014-06-30 MED ORDER — NEOSTIGMINE METHYLSULFATE 10 MG/10ML IV SOLN
INTRAVENOUS | Status: AC
  Filled 2014-06-30: qty 2

## 2014-06-30 MED ORDER — ONDANSETRON HCL 4 MG/2ML IJ SOLN
4.0000 mg | Freq: Four times a day (QID) | INTRAMUSCULAR | Status: DC | PRN
  Administered 2014-07-01 (×2): 4 mg via INTRAVENOUS
  Filled 2014-06-30 (×2): qty 2

## 2014-06-30 MED ORDER — VANCOMYCIN HCL 1000 MG IV SOLR
1000.0000 mg | INTRAVENOUS | Status: DC | PRN
  Administered 2014-06-30: 1000 mg via INTRAVENOUS

## 2014-06-30 MED ORDER — ATROPINE SULFATE 0.1 MG/ML IJ SOLN
INTRAMUSCULAR | Status: AC
  Filled 2014-06-30: qty 20

## 2014-06-30 MED ORDER — SUCCINYLCHOLINE CHLORIDE 20 MG/ML IJ SOLN
INTRAMUSCULAR | Status: AC
  Filled 2014-06-30: qty 1

## 2014-06-30 MED ORDER — PHENOL 1.4 % MT LIQD: 1.0000 | OROMUCOSAL | Status: DC | PRN

## 2014-06-30 MED ORDER — ONDANSETRON HCL 4 MG/2ML IJ SOLN
INTRAMUSCULAR | Status: DC | PRN
  Administered 2014-06-30: 4 mg via INTRAVENOUS

## 2014-06-30 MED ORDER — GLYCOPYRROLATE 0.2 MG/ML IJ SOLN
INTRAMUSCULAR | Status: AC
  Filled 2014-06-30: qty 2

## 2014-06-30 MED ORDER — LIDOCAINE HCL (CARDIAC) 20 MG/ML IV SOLN
INTRAVENOUS | Status: AC
  Filled 2014-06-30: qty 5

## 2014-06-30 MED ORDER — FENTANYL CITRATE (PF) 250 MCG/5ML IJ SOLN
INTRAMUSCULAR | Status: AC
  Filled 2014-06-30: qty 5

## 2014-06-30 MED ORDER — CALCIUM CARBONATE-VITAMIN D 500-200 MG-UNIT PO TABS
2.0000 | ORAL_TABLET | Freq: Every day | ORAL | Status: DC
  Administered 2014-07-01 – 2014-07-02 (×2): 2 via ORAL
  Filled 2014-06-30 (×2): qty 2

## 2014-06-30 MED ORDER — LIOTHYRONINE SODIUM 5 MCG PO TABS
5.0000 ug | ORAL_TABLET | Freq: Every day | ORAL | Status: DC
  Administered 2014-07-01 – 2014-07-02 (×2): 5 ug via ORAL
  Filled 2014-06-30 (×3): qty 1

## 2014-06-30 MED ORDER — ARTIFICIAL TEARS OP OINT
TOPICAL_OINTMENT | OPHTHALMIC | Status: AC
  Filled 2014-06-30: qty 7

## 2014-06-30 MED ORDER — ACETAMINOPHEN 650 MG RE SUPP: 650.0000 mg | Freq: Four times a day (QID) | RECTAL | Status: DC | PRN

## 2014-06-30 MED ORDER — METOCLOPRAMIDE HCL 5 MG PO TABS: 5.0000 mg | ORAL_TABLET | Freq: Three times a day (TID) | ORAL | Status: DC | PRN

## 2014-06-30 MED ORDER — PROPOFOL 10 MG/ML IV BOLUS
INTRAVENOUS | Status: AC
  Filled 2014-06-30: qty 20

## 2014-06-30 MED ORDER — DEXTROSE 5 % IV SOLN
500.0000 mg | Freq: Four times a day (QID) | INTRAVENOUS | Status: DC | PRN
  Filled 2014-06-30: qty 5

## 2014-06-30 MED ORDER — MENTHOL 3 MG MT LOZG: 1.0000 | LOZENGE | OROMUCOSAL | Status: DC | PRN

## 2014-06-30 MED ORDER — HYDROMORPHONE HCL 1 MG/ML IJ SOLN
INTRAMUSCULAR | Status: AC
  Filled 2014-06-30: qty 2

## 2014-06-30 MED ORDER — SODIUM CHLORIDE 0.9 % IR SOLN
Status: DC | PRN
  Administered 2014-06-30: 3000 mL

## 2014-06-30 MED ORDER — ROCURONIUM BROMIDE 100 MG/10ML IV SOLN
INTRAVENOUS | Status: DC | PRN
  Administered 2014-06-30: 40 mg via INTRAVENOUS

## 2014-06-30 MED ORDER — ONDANSETRON HCL 4 MG/2ML IJ SOLN
INTRAMUSCULAR | Status: AC
  Filled 2014-06-30: qty 2

## 2014-06-30 MED ORDER — ONDANSETRON HCL 4 MG/2ML IJ SOLN: 4.0000 mg | Freq: Once | INTRAMUSCULAR | Status: DC | PRN

## 2014-06-30 MED ORDER — PHENYLEPHRINE HCL 10 MG/ML IJ SOLN
INTRAMUSCULAR | Status: DC | PRN
  Administered 2014-06-30: 40 ug via INTRAVENOUS
  Administered 2014-06-30: 80 ug via INTRAVENOUS

## 2014-06-30 MED ORDER — HYDROMORPHONE HCL 1 MG/ML IJ SOLN
INTRAMUSCULAR | Status: AC
  Filled 2014-06-30: qty 1

## 2014-06-30 MED ORDER — EPHEDRINE SULFATE 50 MG/ML IJ SOLN
INTRAMUSCULAR | Status: DC | PRN
Start: 2014-06-30 — End: 2014-06-30
  Administered 2014-06-30: 5 mg via INTRAVENOUS
  Administered 2014-06-30 (×2): 10 mg via INTRAVENOUS
  Administered 2014-06-30 (×2): 5 mg via INTRAVENOUS

## 2014-06-30 MED ORDER — SODIUM CHLORIDE 0.9 % IV SOLN
1000.0000 mg | INTRAVENOUS | Status: AC
  Administered 2014-06-30: 1000 mg via INTRAVENOUS
  Filled 2014-06-30 (×2): qty 10

## 2014-06-30 MED ORDER — SODIUM CHLORIDE 0.9 % IJ SOLN
INTRAMUSCULAR | Status: AC
  Filled 2014-06-30: qty 20

## 2014-06-30 MED ORDER — METOCLOPRAMIDE HCL 5 MG/ML IJ SOLN: 5.0000 mg | Freq: Three times a day (TID) | INTRAMUSCULAR | Status: DC | PRN

## 2014-06-30 MED ORDER — VANCOMYCIN HCL IN DEXTROSE 1-5 GM/200ML-% IV SOLN
INTRAVENOUS | Status: AC
  Filled 2014-06-30: qty 200

## 2014-06-30 MED ORDER — METHOCARBAMOL 500 MG PO TABS
ORAL_TABLET | ORAL | Status: AC
  Filled 2014-06-30: qty 1

## 2014-06-30 MED ORDER — OXYCODONE HCL 5 MG PO TABS
ORAL_TABLET | ORAL | Status: AC
  Filled 2014-06-30: qty 2

## 2014-06-30 MED ORDER — HEPARIN SODIUM (PORCINE) 1000 UNIT/ML IJ SOLN
INTRAMUSCULAR | Status: AC
  Filled 2014-06-30: qty 1

## 2014-06-30 MED ORDER — OXYCODONE HCL 5 MG PO TABS
5.0000 mg | ORAL_TABLET | ORAL | Status: DC | PRN
  Administered 2014-06-30: 10 mg via ORAL
  Administered 2014-06-30: 5 mg via ORAL
  Administered 2014-07-01 (×3): 10 mg via ORAL
  Administered 2014-07-01: 15 mg via ORAL
  Administered 2014-07-01: 5 mg via ORAL
  Administered 2014-07-02 (×2): 15 mg via ORAL
  Filled 2014-06-30 (×3): qty 3
  Filled 2014-06-30: qty 1
  Filled 2014-06-30: qty 3
  Filled 2014-06-30: qty 2
  Filled 2014-06-30: qty 1
  Filled 2014-06-30 (×2): qty 2

## 2014-06-30 MED ORDER — ALUM & MAG HYDROXIDE-SIMETH 200-200-20 MG/5ML PO SUSP: 30.0000 mL | ORAL | Status: DC | PRN

## 2014-06-30 MED ORDER — CALCIUM-MAGNESIUM-VITAMIN D 400-166.7-133.3 MG-MG-UNIT PO TABS: 1.0000 | ORAL_TABLET | Freq: Every day | ORAL | Status: DC

## 2014-06-30 MED ORDER — ACETAMINOPHEN 325 MG PO TABS
650.0000 mg | ORAL_TABLET | Freq: Four times a day (QID) | ORAL | Status: DC | PRN
  Administered 2014-07-01: 650 mg via ORAL
  Filled 2014-06-30: qty 2

## 2014-06-30 MED ORDER — ETOMIDATE 2 MG/ML IV SOLN
INTRAVENOUS | Status: AC
  Filled 2014-06-30: qty 10

## 2014-06-30 MED ORDER — FENTANYL CITRATE (PF) 100 MCG/2ML IJ SOLN
INTRAMUSCULAR | Status: DC | PRN
  Administered 2014-06-30 (×5): 50 ug via INTRAVENOUS

## 2014-06-30 MED ORDER — GLYCOPYRROLATE 0.2 MG/ML IJ SOLN
INTRAMUSCULAR | Status: DC | PRN
  Administered 2014-06-30: 0.4 mg via INTRAVENOUS

## 2014-06-30 MED ORDER — GLYCOPYRROLATE 0.2 MG/ML IJ SOLN
INTRAMUSCULAR | Status: AC
  Filled 2014-06-30: qty 3

## 2014-06-30 MED ORDER — HYDROMORPHONE HCL 1 MG/ML IJ SOLN
1.0000 mg | INTRAMUSCULAR | Status: DC | PRN
  Administered 2014-06-30 – 2014-07-01 (×3): 1 mg via INTRAVENOUS
  Filled 2014-06-30 (×2): qty 1

## 2014-06-30 MED ORDER — PHENYLEPHRINE 40 MCG/ML (10ML) SYRINGE FOR IV PUSH (FOR BLOOD PRESSURE SUPPORT)
PREFILLED_SYRINGE | INTRAVENOUS | Status: AC
  Filled 2014-06-30: qty 20

## 2014-06-30 MED ORDER — LIDOCAINE HCL (CARDIAC) 20 MG/ML IV SOLN
INTRAVENOUS | Status: AC
  Filled 2014-06-30: qty 15

## 2014-06-30 MED ORDER — HYDROMORPHONE HCL 1 MG/ML IJ SOLN
0.2500 mg | INTRAMUSCULAR | Status: DC | PRN
  Administered 2014-06-30 (×4): 0.5 mg via INTRAVENOUS

## 2014-06-30 MED ORDER — CALCIUM CHLORIDE 10 % IV SOLN
INTRAVENOUS | Status: AC
  Filled 2014-06-30: qty 10

## 2014-06-30 MED ORDER — BIOTIN 1000 MCG PO TABS: 1000.0000 ug | ORAL_TABLET | Freq: Every day | ORAL | Status: DC

## 2014-06-30 MED ORDER — OXYCODONE HCL 5 MG/5ML PO SOLN: 5.0000 mg | Freq: Once | ORAL | Status: DC | PRN

## 2014-06-30 MED ORDER — PROPOFOL 10 MG/ML IV BOLUS
INTRAVENOUS | Status: DC | PRN
  Administered 2014-06-30: 200 mg via INTRAVENOUS

## 2014-06-30 MED ORDER — ALPRAZOLAM 0.5 MG PO TABS
0.5000 mg | ORAL_TABLET | Freq: Every evening | ORAL | Status: DC | PRN
  Administered 2014-07-01: 0.5 mg via ORAL
  Filled 2014-06-30: qty 1

## 2014-06-30 MED ORDER — DIPHENHYDRAMINE HCL 12.5 MG/5ML PO ELIX: 12.5000 mg | ORAL_SOLUTION | ORAL | Status: DC | PRN

## 2014-06-30 MED ORDER — LACTATED RINGERS IV SOLN
INTRAVENOUS | Status: DC | PRN
  Administered 2014-06-30 (×2): via INTRAVENOUS

## 2014-06-30 MED ORDER — LIDOCAINE HCL (CARDIAC) 20 MG/ML IV SOLN
INTRAVENOUS | Status: DC | PRN
  Administered 2014-06-30: 40 mg via INTRAVENOUS

## 2014-06-30 MED ORDER — VITAMIN D 1000 UNITS PO TABS
2000.0000 [IU] | ORAL_TABLET | Freq: Every day | ORAL | Status: DC
  Administered 2014-07-01 – 2014-07-02 (×2): 2000 [IU] via ORAL
  Filled 2014-06-30: qty 2

## 2014-06-30 MED ORDER — LACTATED RINGERS IV SOLN
INTRAVENOUS | Status: DC
  Administered 2014-06-30: 13:00:00 via INTRAVENOUS

## 2014-06-30 MED ORDER — DEXAMETHASONE SODIUM PHOSPHATE 4 MG/ML IJ SOLN
INTRAMUSCULAR | Status: AC
  Filled 2014-06-30: qty 2

## 2014-06-30 MED ORDER — EPHEDRINE SULFATE 50 MG/ML IJ SOLN
INTRAMUSCULAR | Status: AC
  Filled 2014-06-30: qty 1

## 2014-06-30 MED ORDER — VITAMIN D3 LIQD: 2.0000 [drp] | Freq: Three times a day (TID) | Status: DC

## 2014-06-30 MED ORDER — METHOCARBAMOL 500 MG PO TABS
500.0000 mg | ORAL_TABLET | Freq: Four times a day (QID) | ORAL | Status: DC | PRN
  Administered 2014-06-30 – 2014-07-02 (×7): 500 mg via ORAL
  Filled 2014-06-30 (×6): qty 1

## 2014-06-30 MED ORDER — LEVOTHYROXINE SODIUM 75 MCG PO TABS
75.0000 ug | ORAL_TABLET | Freq: Every day | ORAL | Status: DC
  Administered 2014-07-01 – 2014-07-02 (×2): 75 ug via ORAL
  Filled 2014-06-30 (×2): qty 1

## 2014-06-30 MED ORDER — NEOSTIGMINE METHYLSULFATE 10 MG/10ML IV SOLN
INTRAVENOUS | Status: DC | PRN
  Administered 2014-06-30: 3 mg via INTRAVENOUS

## 2014-06-30 MED ORDER — DEXAMETHASONE SODIUM PHOSPHATE 4 MG/ML IJ SOLN
INTRAMUSCULAR | Status: DC | PRN
  Administered 2014-06-30: 8 mg via INTRAVENOUS

## 2014-06-30 MED ORDER — VANCOMYCIN HCL IN DEXTROSE 1-5 GM/200ML-% IV SOLN
1000.0000 mg | Freq: Two times a day (BID) | INTRAVENOUS | Status: AC
  Administered 2014-07-01: 1000 mg via INTRAVENOUS
  Filled 2014-06-30: qty 200

## 2014-06-30 MED ORDER — OXYCODONE HCL 5 MG PO TABS: 5.0000 mg | ORAL_TABLET | Freq: Once | ORAL | Status: DC | PRN

## 2014-06-30 MED ORDER — SODIUM CHLORIDE 0.9 % IV SOLN
INTRAVENOUS | Status: DC
  Administered 2014-06-30: 22:00:00 via INTRAVENOUS

## 2014-06-30 MED ORDER — ASPIRIN EC 325 MG PO TBEC
325.0000 mg | DELAYED_RELEASE_TABLET | Freq: Two times a day (BID) | ORAL | Status: DC
  Administered 2014-07-01 – 2014-07-02 (×3): 325 mg via ORAL
  Filled 2014-06-30 (×3): qty 1

## 2014-06-30 MED ORDER — ONDANSETRON HCL 4 MG/2ML IJ SOLN
INTRAMUSCULAR | Status: AC
  Filled 2014-06-30: qty 4

## 2014-06-30 SURGICAL SUPPLY — 54 items
APL SKNCLS STERI-STRIP NONHPOA (GAUZE/BANDAGES/DRESSINGS) ×1
BENZOIN TINCTURE PRP APPL 2/3 (GAUZE/BANDAGES/DRESSINGS) ×2 IMPLANT
BLADE SAW SGTL 18X1.27X75 (BLADE) ×2 IMPLANT
BLADE SURG ROTATE 9660 (MISCELLANEOUS) IMPLANT
BNDG GAUZE ELAST 4 BULKY (GAUZE/BANDAGES/DRESSINGS) IMPLANT
CAPT HIP TOTAL 2 ×1 IMPLANT
CELLS DAT CNTRL 66122 CELL SVR (MISCELLANEOUS) ×1 IMPLANT
COVER SURGICAL LIGHT HANDLE (MISCELLANEOUS) ×2 IMPLANT
DRAPE C-ARM 42X72 X-RAY (DRAPES) ×2 IMPLANT
DRAPE IMP U-DRAPE 54X76 (DRAPES) ×2 IMPLANT
DRAPE STERI IOBAN 125X83 (DRAPES) ×2 IMPLANT
DRAPE U-SHAPE 47X51 STRL (DRAPES) ×6 IMPLANT
DRSG AQUACEL AG ADV 3.5X10 (GAUZE/BANDAGES/DRESSINGS) ×2 IMPLANT
DURAPREP 26ML APPLICATOR (WOUND CARE) ×2 IMPLANT
ELECT BLADE 4.0 EZ CLEAN MEGAD (MISCELLANEOUS) ×2
ELECT BLADE 6.5 EXT (BLADE) IMPLANT
ELECT CAUTERY BLADE 6.4 (BLADE) ×1 IMPLANT
ELECT REM PT RETURN 9FT ADLT (ELECTROSURGICAL) ×2
ELECTRODE BLDE 4.0 EZ CLN MEGD (MISCELLANEOUS) IMPLANT
ELECTRODE REM PT RTRN 9FT ADLT (ELECTROSURGICAL) ×1 IMPLANT
FACESHIELD WRAPAROUND (MASK) ×4 IMPLANT
FACESHIELD WRAPAROUND OR TEAM (MASK) ×2 IMPLANT
GLOVE BIOGEL PI IND STRL 8 (GLOVE) ×2 IMPLANT
GLOVE BIOGEL PI INDICATOR 8 (GLOVE) ×3
GLOVE ECLIPSE 8.0 STRL XLNG CF (GLOVE) ×2 IMPLANT
GLOVE ORTHO TXT STRL SZ7.5 (GLOVE) ×5 IMPLANT
GOWN STRL REUS W/ TWL LRG LVL3 (GOWN DISPOSABLE) ×2 IMPLANT
GOWN STRL REUS W/ TWL XL LVL3 (GOWN DISPOSABLE) ×2 IMPLANT
GOWN STRL REUS W/TWL LRG LVL3 (GOWN DISPOSABLE) ×6
GOWN STRL REUS W/TWL XL LVL3 (GOWN DISPOSABLE) ×4
HANDPIECE INTERPULSE COAX TIP (DISPOSABLE) ×2
KIT BASIN OR (CUSTOM PROCEDURE TRAY) ×2 IMPLANT
KIT ROOM TURNOVER OR (KITS) ×2 IMPLANT
MANIFOLD NEPTUNE II (INSTRUMENTS) ×2 IMPLANT
NS IRRIG 1000ML POUR BTL (IV SOLUTION) ×2 IMPLANT
PACK TOTAL JOINT (CUSTOM PROCEDURE TRAY) ×2 IMPLANT
PACK UNIVERSAL I (CUSTOM PROCEDURE TRAY) ×2 IMPLANT
PAD ARMBOARD 7.5X6 YLW CONV (MISCELLANEOUS) ×4 IMPLANT
RETRACTOR WND ALEXIS 18 MED (MISCELLANEOUS) ×1 IMPLANT
RTRCTR WOUND ALEXIS 18CM MED (MISCELLANEOUS) ×2
SET HNDPC FAN SPRY TIP SCT (DISPOSABLE) ×1 IMPLANT
SPONGE LAP 4X18 X RAY DECT (DISPOSABLE) ×1 IMPLANT
STRIP CLOSURE SKIN 1/2X4 (GAUZE/BANDAGES/DRESSINGS) ×4 IMPLANT
SUT ETHIBOND NAB CT1 #1 30IN (SUTURE) ×3 IMPLANT
SUT MNCRL AB 4-0 PS2 18 (SUTURE) ×2 IMPLANT
SUT VIC AB 0 CT1 27 (SUTURE) ×2
SUT VIC AB 0 CT1 27XBRD ANBCTR (SUTURE) ×1 IMPLANT
SUT VIC AB 1 CT1 27 (SUTURE) ×2
SUT VIC AB 1 CT1 27XBRD ANBCTR (SUTURE) ×1 IMPLANT
SUT VIC AB 2-0 CT1 27 (SUTURE) ×2
SUT VIC AB 2-0 CT1 TAPERPNT 27 (SUTURE) ×1 IMPLANT
TOWEL OR 17X24 6PK STRL BLUE (TOWEL DISPOSABLE) ×2 IMPLANT
TOWEL OR 17X26 10 PK STRL BLUE (TOWEL DISPOSABLE) ×2 IMPLANT
TRAY FOLEY CATH 16FRSI W/METER (SET/KITS/TRAYS/PACK) ×1 IMPLANT

## 2014-06-30 NOTE — Anesthesia Preprocedure Evaluation (Addendum)
Anesthesia Evaluation  Patient identified by MRN, date of birth, ID band Patient awake    Reviewed: Allergy & Precautions, NPO status , Patient's Chart, lab work & pertinent test results  Airway Mallampati: II  TM Distance: >3 FB Neck ROM: Full    Dental  (+) Teeth Intact, Dental Advisory Given   Pulmonary  breath sounds clear to auscultation        Cardiovascular hypertension, Rhythm:Regular Rate:Normal     Neuro/Psych    GI/Hepatic   Endo/Other    Renal/GU      Musculoskeletal   Abdominal   Peds  Hematology   Anesthesia Other Findings   Reproductive/Obstetrics                            Anesthesia Physical Anesthesia Plan  ASA: II  Anesthesia Plan: MAC and Spinal   Post-op Pain Management:    Induction: Intravenous  Airway Management Planned: Natural Airway and Simple Face Mask  Additional Equipment:   Intra-op Plan:   Post-operative Plan:   Informed Consent: I have reviewed the patients History and Physical, chart, labs and discussed the procedure including the risks, benefits and alternatives for the proposed anesthesia with the patient or authorized representative who has indicated his/her understanding and acceptance.   Dental advisory given  Plan Discussed with: CRNA and Anesthesiologist  Anesthesia Plan Comments:         Anesthesia Quick Evaluation

## 2014-06-30 NOTE — Anesthesia Postprocedure Evaluation (Signed)
  Anesthesia Post-op Note  Patient: Sarah Blackwell  Procedure(s) Performed: Procedure(s): RIGHT TOTAL HIP ARTHROPLASTY ANTERIOR APPROACH (Right)  Patient Location: PACU  Anesthesia Type: MAC, Spinal   Level of Consciousness: awake, alert  and oriented  Airway and Oxygen Therapy: Patient Spontanous Breathing  Post-op Pain: mild  Post-op Assessment: Post-op Vital signs reviewed  Post-op Vital Signs: Reviewed  Last Vitals:  Filed Vitals:   06/30/14 1845  BP: 116/66  Pulse: 63  Temp: 36.7 C  Resp: 16    Complications: No apparent anesthesia complications

## 2014-06-30 NOTE — H&P (Signed)
TOTAL HIP ADMISSION H&P  Patient is admitted for right total hip arthroplasty.  Subjective:  Chief Complaint: right hip pain  HPI: Sarah Blackwell, 59 y.o. female, has a history of pain and functional disability in the right hip(s) due to arthritis and patient has failed non-surgical conservative treatments for greater than 12 weeks to include NSAID's and/or analgesics, corticosteriod injections and activity modification.  Onset of symptoms was abrupt starting 1 years ago with gradually worsening course since that time.The patient noted no past surgery on the right hip(s).  Patient currently rates pain in the right hip at 9 out of 10 with activity. Patient has night pain, worsening of pain with activity and weight bearing, pain that interfers with activities of daily living and pain with passive range of motion. Patient has evidence of subchondral sclerosis, periarticular osteophytes and joint space narrowing by imaging studies. This condition presents safety issues increasing the risk of falls.  There is no current active infection.  Patient Active Problem List   Diagnosis Date Noted  . Lumbar back pain with radiculopathy affecting right lower extremity 04/09/2014  . DDD (degenerative disc disease), lumbar 04/09/2014  . Chronic right hip pain 01/28/2014  . Osteoarthritis of right hip 01/28/2014   Past Medical History  Diagnosis Date  . Hyperlipidemia   . Allergic rhinitis   . PCOS (polycystic ovarian syndrome)   . Perimenopausal   . Anxiety   . Fluid retention   . Hypertension     never has taken medications  . Hypothyroidism   . Headache     migraines when she was younger  . Arthritis   . Cancer     skin cancer  . Rosacea   . Complication of anesthesia     difficulty waking up after surgery as a child.   . Pre-diabetes     Past Surgical History  Procedure Laterality Date  . Tonsillectomy    . Fertility surgery    . Mohs surgery      x 2  . Colonoscopy       Prescriptions prior to admission  Medication Sig Dispense Refill Last Dose  . ALPRAZolam (XANAX) 0.5 MG tablet Take 0.5 mg by mouth at bedtime as needed for sleep.     . Ascorbic Acid (VITAMIN C PO) Take 1 tablet by mouth daily.     . Biotin 1000 MCG tablet Take 1,000 mcg by mouth daily.     . Calcium-Magnesium-Vitamin D 400-166.7-133.3 MG-MG-UNIT TABS Take 1-2 tablets by mouth daily.     . Cholecalciferol (VITAMIN D3) LIQD Take 2 drops by mouth 3 (three) times daily.     Marland Kitchen estradiol-norethindrone (COMBIPATCH) 0.05-0.14 MG/DAY Place 1 patch onto the skin 2 (two) times a week. Days vary     . Eszopiclone (ESZOPICLONE) 3 MG TABS Take 3 mg by mouth at bedtime as needed (sleep).      . EVENING PRIMROSE OIL PO Take 1 tablet by mouth daily.     . Fish Oil-Cholecalciferol (OMEGA-3 FISH OIL-VITAMIN D3) 1200-1000 MG-UNIT CAPS Take 1 capsule by mouth daily.     . fluticasone (FLONASE) 50 MCG/ACT nasal spray Place 1-2 sprays into both nostrils daily as needed for allergies.     . Glucosamine-Chondroitin-MSM TABS Take 1-2 tablets by mouth daily.     Marland Kitchen ibuprofen (ADVIL,MOTRIN) 200 MG tablet Take 400 mg by mouth daily.      . L-TRYPTOPHAN PO Take 0.5-1 tablets by mouth daily.     Marland Kitchen levothyroxine (SYNTHROID, LEVOTHROID) 75  MCG tablet Take 75 mcg by mouth daily before breakfast.     . liothyronine (CYTOMEL) 5 MCG tablet Take 5 mcg by mouth daily.     Marland Kitchen MAGNESIUM PO Take 1 tablet by mouth 2 (two) times daily. OTC Magnesium Malate     . naproxen sodium (ANAPROX) 220 MG tablet Take 440 mg by mouth 2 (two) times daily.     Marland Kitchen OVER THE COUNTER MEDICATION Take 500 mcg by mouth daily. OTC Iodine supplement     . Probiotic CAPS Take 1 capsule by mouth daily.     . Taurine 500 MG CAPS Take 500-1,000 mg by mouth daily.     . TURMERIC PO Take 1 tablet by mouth. 2 to 3 times a day     . naproxen (NAPROSYN) 500 MG tablet Take 1 tablet (500 mg total) by mouth 2 (two) times daily with a meal. 60 tablet 2   . predniSONE  (DELTASONE) 10 MG tablet 2 tablets daily in am for 10 days (Patient not taking: Reported on 06/17/2014) 30 tablet 0 Not Taking at Unknown time   Allergies  Allergen Reactions  . Sulfa Antibiotics     History  Substance Use Topics  . Smoking status: Never Smoker   . Smokeless tobacco: Never Used  . Alcohol Use: 8.4 oz/week    14 Glasses of wine per week    Family History  Problem Relation Age of Onset  . Cancer Other   . Hypertension Other   . Hyperlipidemia Other   . Stroke Other   . Anemia Mother   . Hyperlipidemia Mother   . Hypertension Mother   . Stroke Father   . Hyperlipidemia Father   . Cancer Father   . CVA Father   . Heart attack Maternal Grandfather   . Cancer Maternal Grandfather   . Cancer Paternal Grandfather   . Alcohol abuse Paternal Grandfather      Review of Systems  Musculoskeletal: Positive for joint pain.  All other systems reviewed and are negative.   Objective:  Physical Exam  Constitutional: She is oriented to person, place, and time. She appears well-developed and well-nourished.  HENT:  Head: Normocephalic and atraumatic.  Eyes: EOM are normal. Pupils are equal, round, and reactive to light.  Neck: Normal range of motion. Neck supple.  Cardiovascular: Normal rate and regular rhythm.   Respiratory: Effort normal and breath sounds normal.  GI: Soft. Bowel sounds are normal.  Musculoskeletal:       Right hip: She exhibits decreased range of motion, decreased strength, tenderness and bony tenderness.  Neurological: She is alert and oriented to person, place, and time.  Skin: Skin is warm and dry.  Psychiatric: She has a normal mood and affect.    Vital signs in last 24 hours:    Labs:   Estimated body mass index is 29.35 kg/(m^2) as calculated from the following:   Height as of 05/15/14: 5\' 8"  (1.727 m).   Weight as of 05/29/14: 87.544 kg (193 lb).   Imaging Review Plain radiographs demonstrate moderate degenerative joint disease of  the right hip(s). CT scan shows severe OA of the right hip. The bone quality appears to be excellent for age and reported activity level.  Assessment/Plan:  End stage arthritis, right hip(s)  The patient history, physical examination, clinical judgement of the provider and imaging studies are consistent with end stage degenerative joint disease of the right hip(s) and total hip arthroplasty is deemed medically necessary. The treatment options  including medical management, injection therapy, arthroscopy and arthroplasty were discussed at length. The risks and benefits of total hip arthroplasty were presented and reviewed. The risks due to aseptic loosening, infection, stiffness, dislocation/subluxation,  thromboembolic complications and other imponderables were discussed.  The patient acknowledged the explanation, agreed to proceed with the plan and consent was signed. Patient is being admitted for inpatient treatment for surgery, pain control, PT, OT, prophylactic antibiotics, VTE prophylaxis, progressive ambulation and ADL's and discharge planning.The patient is planning to be discharged home with home health services

## 2014-06-30 NOTE — Transfer of Care (Signed)
Immediate Anesthesia Transfer of Care Note  Patient: Sarah Blackwell  Procedure(s) Performed: Procedure(s): RIGHT TOTAL HIP ARTHROPLASTY ANTERIOR APPROACH (Right)  Patient Location: PACU  Anesthesia Type:General and Spinal  Level of Consciousness: awake, alert  and oriented  Airway & Oxygen Therapy: Patient Spontanous Breathing  Post-op Assessment: Report given to RN and Post -op Vital signs reviewed and stable  Post vital signs: Reviewed and stable  Last Vitals:  Filed Vitals:   06/30/14 1239  BP: 130/69  Pulse: 63  Temp: 37 C  Resp: 20    Complications: No apparent anesthesia complications

## 2014-06-30 NOTE — Anesthesia Procedure Notes (Addendum)
Spinal Patient location during procedure: OR Start time: 06/30/2014 2:35 PM End time: 06/30/2014 2:40 PM Staffing Performed by: anesthesiologist  Preanesthetic Checklist Completed: patient identified, site marked, surgical consent, pre-op evaluation, timeout performed, IV checked, risks and benefits discussed and monitors and equipment checked Spinal Block Patient position: right lateral decubitus Prep: ChloraPrep Patient monitoring: heart rate, cardiac monitor, continuous pulse ox and blood pressure Needle Needle type: Tuohy  Needle gauge: 22 G Needle length: 9 cm Additional Notes L3-4 clear CSF 10 mg 0.75% bupivacaine injected easily.   After 10 minutes following spinal, patient had inadequate block, decision made to proceed to general anesthesia.  Roberts Gaudy  Procedure Name: Intubation Date/Time: 06/30/2014 3:32 PM Performed by: Garrison Columbus T Pre-anesthesia Checklist: Patient identified, Emergency Drugs available, Suction available and Patient being monitored Patient Re-evaluated:Patient Re-evaluated prior to inductionOxygen Delivery Method: Circle system utilized Preoxygenation: Pre-oxygenation with 100% oxygen Intubation Type: IV induction Ventilation: Mask ventilation without difficulty Laryngoscope Size: Miller and 2 Grade View: Grade I Tube type: Oral Tube size: 7.0 mm Number of attempts: 1 Airway Equipment and Method: Stylet Placement Confirmation: ETT inserted through vocal cords under direct vision,  positive ETCO2 and breath sounds checked- equal and bilateral Secured at: 22 cm Tube secured with: Tape Dental Injury: Teeth and Oropharynx as per pre-operative assessment

## 2014-06-30 NOTE — Brief Op Note (Signed)
06/30/2014  5:03 PM  PATIENT:  Sarah Blackwell  59 y.o. female  PRE-OPERATIVE DIAGNOSIS:  right hip osteoarthritis  POST-OPERATIVE DIAGNOSIS:  right hip osteoarthritis  PROCEDURE:  Procedure(s): RIGHT TOTAL HIP ARTHROPLASTY ANTERIOR APPROACH (Right)  SURGEON:  Surgeon(s) and Role:    * Mcarthur Rossetti, MD - Primary  PHYSICIAN ASSISTANT: Benita Stabile, PA-C  ANESTHESIA:   spinal and general  EBL:  Total I/O In: 1300 [I.V.:1300] Out: 450 [Urine:200; Blood:250]  BLOOD ADMINISTERED:none  DRAINS: none   SPECIMEN:  No Specimen  DISPOSITION OF SPECIMEN:  N/A  COUNTS:  YES  TOURNIQUET:  * No tourniquets in log *  DICTATION: .Other Dictation: Dictation Number [id number]  PLAN OF CARE: Admit to inpatient   PATIENT DISPOSITION:  PACU - hemodynamically stable.   Delay start of Pharmacological VTE agent (>24hrs) due to surgical blood loss or risk of bleeding: no

## 2014-07-01 ENCOUNTER — Encounter (HOSPITAL_COMMUNITY): Payer: Self-pay | Admitting: Orthopaedic Surgery

## 2014-07-01 LAB — CBC
HCT: 33.8 % — ABNORMAL LOW (ref 36.0–46.0)
Hemoglobin: 11.2 g/dL — ABNORMAL LOW (ref 12.0–15.0)
MCH: 30.4 pg (ref 26.0–34.0)
MCHC: 33.1 g/dL (ref 30.0–36.0)
MCV: 91.8 fL (ref 78.0–100.0)
Platelets: 179 10*3/uL (ref 150–400)
RBC: 3.68 MIL/uL — ABNORMAL LOW (ref 3.87–5.11)
RDW: 12.2 % (ref 11.5–15.5)
WBC: 5.6 10*3/uL (ref 4.0–10.5)

## 2014-07-01 LAB — BASIC METABOLIC PANEL
ANION GAP: 9 (ref 5–15)
BUN: 10 mg/dL (ref 6–20)
CALCIUM: 9.1 mg/dL (ref 8.9–10.3)
CO2: 28 mmol/L (ref 22–32)
CREATININE: 0.6 mg/dL (ref 0.44–1.00)
Chloride: 102 mmol/L (ref 101–111)
GFR calc non Af Amer: >60 mL/min (ref >60)
Glucose, Bld: 187 mg/dL — ABNORMAL HIGH (ref 65–99)
Potassium: 4.4 mmol/L (ref 3.5–5.1)
Sodium: 139 mmol/L (ref 135–145)

## 2014-07-01 MED ORDER — OXYCODONE HCL ER 10 MG PO T12A
20.0000 mg | EXTENDED_RELEASE_TABLET | Freq: Two times a day (BID) | ORAL | Status: DC
  Administered 2014-07-01: 20 mg via ORAL
  Filled 2014-07-01 (×2): qty 2

## 2014-07-01 MED ORDER — IBUPROFEN 200 MG PO TABS
800.0000 mg | ORAL_TABLET | Freq: Four times a day (QID) | ORAL | Status: DC | PRN
  Administered 2014-07-01 – 2014-07-02 (×4): 800 mg via ORAL
  Filled 2014-07-01 (×4): qty 4

## 2014-07-01 MED ORDER — KETOROLAC TROMETHAMINE 30 MG/ML IJ SOLN: 30.0000 mg | Freq: Four times a day (QID) | INTRAMUSCULAR | Status: DC | PRN

## 2014-07-01 NOTE — Discharge Instructions (Signed)
INSTRUCTIONS AFTER JOINT REPLACEMENT  ° °o Remove items at home which could result in a fall. This includes throw rugs or furniture in walking pathways °o ICE to the affected joint every three hours while awake for 30 minutes at a time, for at least the first 3-5 days, and then as needed for pain and swelling.  Continue to use ice for pain and swelling. You may notice swelling that will progress down to the foot and ankle.  This is normal after surgery.  Elevate your leg when you are not up walking on it.   °o Continue to use the breathing machine you got in the hospital (incentive spirometer) which will help keep your temperature down.  It is common for your temperature to cycle up and down following surgery, especially at night when you are not up moving around and exerting yourself.  The breathing machine keeps your lungs expanded and your temperature down. ° ° °DIET:  As you were doing prior to hospitalization, we recommend a well-balanced diet. ° °DRESSING / WOUND CARE / SHOWERING ° °Keep the surgical dressing until follow up.  The dressing is water proof, so you can shower without any extra covering.  IF THE DRESSING FALLS OFF or the wound gets wet inside, change the dressing with sterile gauze.  Please use good hand washing techniques before changing the dressing.  Do not use any lotions or creams on the incision until instructed by your surgeon.   ° °ACTIVITY ° °o Increase activity slowly as tolerated, but follow the weight bearing instructions below.   °o No driving for 6 weeks or until further direction given by your physician.  You cannot drive while taking narcotics.  °o No lifting or carrying greater than 10 lbs. until further directed by your surgeon. °o Avoid periods of inactivity such as sitting longer than an hour when not asleep. This helps prevent blood clots.  °o You may return to work once you are authorized by your doctor.  ° ° ° °WEIGHT BEARING  ° °Weight bearing as tolerated with assist  device (walker, cane, etc) as directed, use it as long as suggested by your surgeon or therapist, typically at least 4-6 weeks. ° ° °EXERCISES ° °Results after joint replacement surgery are often greatly improved when you follow the exercise, range of motion and muscle strengthening exercises prescribed by your doctor. Safety measures are also important to protect the joint from further injury. Any time any of these exercises cause you to have increased pain or swelling, decrease what you are doing until you are comfortable again and then slowly increase them. If you have problems or questions, call your caregiver or physical therapist for advice.  ° °Rehabilitation is important following a joint replacement. After just a few days of immobilization, the muscles of the leg can become weakened and shrink (atrophy).  These exercises are designed to build up the tone and strength of the thigh and leg muscles and to improve motion. Often times heat used for twenty to thirty minutes before working out will loosen up your tissues and help with improving the range of motion but do not use heat for the first two weeks following surgery (sometimes heat can increase post-operative swelling).  ° °These exercises can be done on a training (exercise) mat, on the floor, on a table or on a bed. Use whatever works the best and is most comfortable for you.    Use music or television while you are exercising so that   the exercises are a pleasant break in your day. This will make your life better with the exercises acting as a break in your routine that you can look forward to.   Perform all exercises about fifteen times, three times per day or as directed.  You should exercise both the operative leg and the other leg as well. ° °Exercises include: °  °• Quad Sets - Tighten up the muscle on the front of the thigh (Quad) and hold for 5-10 seconds.   °• Straight Leg Raises - With your knee straight (if you were given a brace, keep it on),  lift the leg to 60 degrees, hold for 3 seconds, and slowly lower the leg.  Perform this exercise against resistance later as your leg gets stronger.  °• Leg Slides: Lying on your back, slowly slide your foot toward your buttocks, bending your knee up off the floor (only go as far as is comfortable). Then slowly slide your foot back down until your leg is flat on the floor again.  °• Angel Wings: Lying on your back spread your legs to the side as far apart as you can without causing discomfort.  °• Hamstring Strength:  Lying on your back, push your heel against the floor with your leg straight by tightening up the muscles of your buttocks.  Repeat, but this time bend your knee to a comfortable angle, and push your heel against the floor.  You may put a pillow under the heel to make it more comfortable if necessary.  ° °A rehabilitation program following joint replacement surgery can speed recovery and prevent re-injury in the future due to weakened muscles. Contact your doctor or a physical therapist for more information on knee rehabilitation.  ° ° °CONSTIPATION ° °Constipation is defined medically as fewer than three stools per week and severe constipation as less than one stool per week.  Even if you have a regular bowel pattern at home, your normal regimen is likely to be disrupted due to multiple reasons following surgery.  Combination of anesthesia, postoperative narcotics, change in appetite and fluid intake all can affect your bowels.  ° °YOU MUST use at least one of the following options; they are listed in order of increasing strength to get the job done.  They are all available over the counter, and you may need to use some, POSSIBLY even all of these options:   ° °Drink plenty of fluids (prune juice may be helpful) and high fiber foods °Colace 100 mg by mouth twice a day  °Senokot for constipation as directed and as needed Dulcolax (bisacodyl), take with full glass of water  °Miralax (polyethylene glycol)  once or twice a day as needed. ° °If you have tried all these things and are unable to have a bowel movement in the first 3-4 days after surgery call either your surgeon or your primary doctor.   ° °If you experience loose stools or diarrhea, hold the medications until you stool forms back up.  If your symptoms do not get better within 1 week or if they get worse, check with your doctor.  If you experience "the worst abdominal pain ever" or develop nausea or vomiting, please contact the office immediately for further recommendations for treatment. ° ° °ITCHING:  If you experience itching with your medications, try taking only a single pain pill, or even half a pain pill at a time.  You can also use Benadryl over the counter for itching or also to   help with sleep.   TED HOSE STOCKINGS:  Use stockings on both legs until for at least 2 weeks or as directed by physician office. They may be removed at night for sleeping.  MEDICATIONS:  See your medication summary on the After Visit Summary that nursing will review with you.  You may have some home medications which will be placed on hold until you complete the course of blood thinner medication.  It is important for you to complete the blood thinner medication as prescribed.  PRECAUTIONS:  If you experience chest pain or shortness of breath - call 911 immediately for transfer to the hospital emergency department.   If you develop a fever greater that 101 F, purulent drainage from wound, increased redness or drainage from wound, foul odor from the wound/dressing, or calf pain - CONTACT YOUR SURGEON.                                                   FOLLOW-UP APPOINTMENTS:  If you do not already have a post-op appointment, please call the office for an appointment to be seen by your surgeon.  Guidelines for how soon to be seen are listed in your After Visit Summary, but are typically between 1-4 weeks after surgery.  OTHER INSTRUCTIONS:   Knee  Replacement:  Do not place pillow under knee, focus on keeping the knee straight while resting. CPM instructions: 0-90 degrees, 2 hours in the morning, 2 hours in the afternoon, and 2 hours in the evening. Place foam block, curve side up under heel at all times except when in CPM or when walking.  DO NOT modify, tear, cut, or change the foam block in any way.  MAKE SURE YOU:   Understand these instructions.   Get help right away if you are not doing well or get worse.    Thank you for letting us be a part of your medical care team.  It is a privilege we respect greatly.  We hope these instructions will help you stay on track for a fast and full recovery!     DO GET AN OVER-THE-COUNTER STOOL SOFTENER TO TAKE TWICE DAILY WHILE ON PAIN MEDS

## 2014-07-01 NOTE — Op Note (Signed)
[name], Blackwell NO.:  [id number]  MEDICAL RECORD NO.:  [id number]  LOCATION:  5N30C                        FACILITY:  [facility]  PHYSICIAN:  Sarah Blackwell, Sarah BlackwellDATE OF BIRTH:  [birth year]  DATE OF PROCEDURE:  06/30/2014 DATE OF DISCHARGE:                              OPERATIVE REPORT   PREOPERATIVE DIAGNOSIS:  Primary osteoarthritis and degenerative joint disease of right hip.  POSTOPERATIVE DIAGNOSIS:  Primary osteoarthritis and degenerative joint disease of right hip.  PROCEDURE:  Right total hip arthroplasty through direct anterior approach.  IMPLANTS:  DePuy Sector Gription acetabular component size 52, size 36+ 4 neutral polyethylene liner, size 13 Corail femoral component with standard offset, size 36 -2 metal hip ball.  SURGEON:  Sarah Blackwell, M.D.  ASSISTANT:  Erskine Emery, PA-C.  ANESTHESIA: 1. Attempted spinal. 2. General.  ANTIBIOTICS:  1 g of IV Ancef and 1 g of IV vancomycin.  BLOOD LOSS:  250 mL.  COMPLICATIONS:  None.  INDICATIONS:  Sarah Blackwell is a 59 year old female patient, well known to me. She has debilitating right hip pain, although x-ray showed minimal arthritic changes.  Intraarticular steroid injection helped her greatly, however, her pain were off quickly.  She still had a lot of pain with activity.  This has affected her mobility, her activities of daily living, and her quality of life.  I did obtain an MRI of her hip, which showed significant denuding of the cartilage but no evidence of avascular necrosis.  It is definitely osteoarthritis and involved the femoral head and the acetabulum.  There is joint space narrowing  and periarticular osteophytes.  With the failure of conservative treatment, she does wish to proceed with a total hip arthroplasty.  She understands the risk of acute blood loss anemia, nerve and vessel injury, fracture, infection, dislocation, and DVT.  She understands the goals of  surgery are to decrease pain, improve mobility, and overall improve quality of life.  PROCEDURE DESCRIPTION:  After informed consent was obtained, appropriate right hip was marked.  She was brought to the operating room, and while she was on her stretcher, they attempted spinal anesthesia.  Foley catheter was then placed and she was placed supine on the Hana fracture table with traction boots in place but no traction applied and a perineal post in place.  She was still able to move her feet and legs and it was felt best from an anesthesia standpoint to then use general anesthesia via an LMA.  This was accomplished.  We then prepped her right operative hip with DuraPrep and sterile drapes.  Time-out was called, and she was identified as correct patient, correct right hip.  I then made an incision inferior and posterior to the anterosuperior iliac spine and carried this obliquely down the leg.  We dissected down to the tensor fascia lata muscle and then tensor fascia was divided longitudinally, so I could see the direct anterior approach to the hip. I identified and cauterized the lateral femoral and circumflex vessels and then identified the femoral neck.  I placed a Cobra retractor around the lateral femoral neck and up underneath the rectus femoris around the medial femoral neck.  I cleaned any kind of  adipose tissue on the femoral neck and then was able to open up the joint capsule in L-type format finding a significant joint effusion.  I placed the Cobra retractor within the hip capsule itself and identified the femoral neck. I then made a femoral neck cut with an oscillating saw proximal to the lesser trochanter and completed this with an osteotome.  I placed a corkscrew guide in the femoral head and removed the femoral head in its entirety and found a significant section devoid of cartilage.  It was very smooth and shiny and hard like marble consistent with  significant osteoarthritis.  I did find that on the acetabular side as well with sclerotic changes.  I then placed a bent Hohmann over the medial acetabular rim and then cleaned remnants of acetabular labrum.  I then began reaming under direct visualization from a size 42 reamer in 2 mm increments all the way to a size 52.  The last 2 reamers sized 50 and 52 were also placed under direct fluoroscopy, so I could obtain my depth of reaming, my inclination, and anteversion.  Once I was pleased with this, I placed the real DePuy Sector Gription acetabular component size 52.  I then placed the 36+ 4 liner for a size 52 acetabular component. Attention was then turned to the femur.  With the leg externally rotated to 110 degrees, extended and adducted, I was able to place a Mueller retractor medially and a Hohmann retractor behind the greater trochanter.  I released the lateral joint capsule and then used a box cutting osteotome to enter the femoral canal and a rongeur to lateralize.  I then began broaching using the Corail broaching system from DePuy from a size 8 up to a size 13.  The size 13 had a nice snug fit even with trying to plane down lower.  We ended up trialing a standard neck and a 36 -2 hip ball concerned about leg lengths.  We brought the leg back over and up with traction and internal rotation, reducing the pelvis, and I was pleased with her offset and leg lengths under direct fluoroscopy.  I could internally rotate it to 45-50 degrees and externally rotate past 100 degrees, and she was stable.  I was pleased again with leg length measurements and offset under fluoroscopy as well.  We then dislocated the hip and removed the trial components. I placed the real Corail femoral component size 13 with standard offset and the real 36 -2 metal hip ball and reduced this back in the acetabulum. Again, I was pleased with stability.  We then irrigated the hip with normal saline solution  using pulsatile lavage.  I closed the hip capsule with interrupted #1 Ethibond suture followed by a running #1 Vicryl in the tensor fascia, 0 Vicryl in the deep tissue, 2-0 Vicryl in subcutaneous tissue, 4-0 Monocryl subcuticular stitch, and Steri-Strips on the skin.  An Aquacel dressing was applied.  She was then taken off the Hana table, awakened, extubated, and taken to recovery room in stable condition.  All final counts were correct.  There were no complications noted.  Of note, Erskine Emery, PA-C assisted in the entire case and his assistance was crucial for facilitating all aspects of this case, and he was present in its entirety.     Sarah Blackwell, M.D.     CYB/MEDQ  D:  06/30/2014  T:  07/01/2014  Job:  [id number]

## 2014-07-01 NOTE — Progress Notes (Signed)
Subjective: 1 Day Post-Op Procedure(s) (LRB): RIGHT TOTAL HIP ARTHROPLASTY ANTERIOR APPROACH (Right) Patient reports pain as moderate.  KLabs not back yet.  Objective: Vital signs in last 24 hours: Temp:  [97.6 F (36.4 C)-98.6 F (37 C)] 97.9 F (36.6 C) (06/22 0530) Pulse Rate:  [52-64] 64 (06/22 0530) Resp:  [8-20] 16 (06/22 0530) BP: (105-130)/(47-76) 108/71 mmHg (06/22 0530) SpO2:  [95 %-100 %] 99 % (06/22 0530) Weight:  [88.451 kg (195 lb)] 88.451 kg (195 lb) (06/21 1239)  Intake/Output from previous day: 06/21 0701 - 06/22 0700 In: 1560 [P.O.:60; I.V.:1500] Out: 2050 [Urine:1800; Blood:250] Intake/Output this shift:    No results for input(s): HGB in the last 72 hours. No results for input(s): WBC, RBC, HCT, PLT in the last 72 hours. No results for input(s): NA, K, CL, CO2, BUN, CREATININE, GLUCOSE, CALCIUM in the last 72 hours. No results for input(s): LABPT, INR in the last 72 hours.  Sensation intact distally Intact pulses distally Dorsiflexion/Plantar flexion intact Incision: scant drainage  Assessment/Plan: 1 Day Post-Op Procedure(s) (LRB): RIGHT TOTAL HIP ARTHROPLASTY ANTERIOR APPROACH (Right) Up with therapy  Discharge to home when clears therapy.  Bonnell Placzek Y 07/01/2014, 7:08 AM

## 2014-07-01 NOTE — Care Management (Signed)
Utilization review completed by Debie Ashline N. Travas Schexnayder, RN BSN 

## 2014-07-01 NOTE — Progress Notes (Signed)
OT Cancellation Note  Patient Details Name: Sarah Blackwell MRN: [id number] DOB: 02-08-1955   Cancelled Treatment:    Reason Eval/Treat Not Completed: Other (comment). Pt continues to feel nauseous after getting up to recliner with PT. Pt requested OT return later  Britt Bottom 07/01/2014, 11:35 AM

## 2014-07-01 NOTE — Progress Notes (Signed)
Patient c/o of pain throughout the day. Doctors aware. Additional pain medications were ordered and patient reports pain being alleviated a little more. Patient worked well with physical therapy. Should go home tomorrow with [facility].

## 2014-07-01 NOTE — Evaluation (Signed)
Physical Therapy Evaluation Patient Details Name: Sarah Blackwell MRN: [id number] DOB: Aug 29, 1955 Today's Date: 07/01/2014   History of Present Illness  Patient is a 59 y/o female s/p R THA. PMH includes HTN, HLD, anxiety, ca.  Clinical Impression  Patient presents with pain, nausea and post surgical deficits RLE s/p R THA. Pt very nauseous sitting upright in chair limiting mobility assessment. Increased time to get pt to stand secondary to nausea/dizziness. Vitals stable. Able to stand x1 Min guard assist. Pt reports having lots of support at home. May not be ready to d/c later today due to uncontrolled pain and limited mobility in AM session. Will continue to follow to maximize independence and mobility prior to return home. May need 1 more night in hospital pending progress in therapy in PM session.     Follow Up Recommendations Home health PT;Supervision/Assistance - 24 hour    Equipment Recommendations  None recommended by PT    Recommendations for Other Services OT consult     Precautions / Restrictions Precautions Precautions: Fall Precaution Comments: Direct anterior approach. Restrictions Weight Bearing Restrictions: Yes RLE Weight Bearing: Weight bearing as tolerated      Mobility  Bed Mobility               General bed mobility comments: Sitting in chair upon PT arrival.   Transfers Overall transfer level: Needs assistance Equipment used: Rolling walker (2 wheeled) Transfers: Sit to/from Stand Sit to Stand: Min guard         General transfer comment: Cues for hand placement and technique. + nausea/dizziness upon standing. BP 108/68.  Ambulation/Gait Ambulation/Gait assistance:  (Unable secondary to nausea/dizziness.)              Stairs            Wheelchair Mobility    Modified Rankin (Stroke Patients Only)       Balance Overall balance assessment: Needs assistance Sitting-balance support: Feet supported;No upper extremity  supported Sitting balance-Leahy Scale: Fair     Standing balance support: During functional activity Standing balance-Leahy Scale: Poor Standing balance comment: Relient on RW. Weightshifting in standing ~1 minute.                             Pertinent Vitals/Pain Pain Assessment: 0-10 Pain Score: 4  Pain Location: right hip with movement. Pain Descriptors / Indicators: Sore;Aching Pain Intervention(s): Limited activity within patient's tolerance;Monitored during session;Premedicated before session;Repositioned    Home Living Family/patient expects to be discharged to:: Private residence Living Arrangements: Spouse/significant other Available Help at Discharge: Family;Available 24 hours/day Type of Home: House Home Access: Stairs to enter Entrance Stairs-Rails: None Entrance Stairs-Number of Steps: 3 Home Layout: Two level;Able to live on main level with bedroom/bathroom Home Equipment: Gilford Rile - 2 wheels;Cane - single point;Bedside commode      Prior Function Level of Independence: Independent               Hand Dominance        Extremity/Trunk Assessment   Upper Extremity Assessment: Defer to OT evaluation           Lower Extremity Assessment: RLE deficits/detail RLE Deficits / Details: Limited AROM/strength secondary to surgery/pain.       Communication   Communication: No difficulties  Cognition Arousal/Alertness: Awake/alert Behavior During Therapy: WFL for tasks assessed/performed Overall Cognitive Status: Within Functional Limits for tasks assessed  General Comments General comments (skin integrity, edema, etc.): Pt's friend in room during session.    Exercises Total Joint Exercises Ankle Circles/Pumps: Both;15 reps;Seated Quad Sets: Right;10 reps;Seated Gluteal Sets: Both;10 reps;Seated      Assessment/Plan    PT Assessment Patient needs continued PT services  PT Diagnosis Difficulty  walking;Acute pain;Generalized weakness   PT Problem List Decreased strength;Pain;Decreased range of motion;Impaired sensation;Decreased balance;Decreased mobility;Decreased activity tolerance;Decreased knowledge of use of DME  PT Treatment Interventions Balance training;Gait training;Functional mobility training;Therapeutic activities;Therapeutic exercise;Patient/family education;Stair training;DME instruction   PT Goals (Current goals can be found in the Care Plan section) Acute Rehab PT Goals Patient Stated Goal: to return home PT Goal Formulation: With patient Time For Goal Achievement: 07/15/14 Potential to Achieve Goals: Good    Frequency 7X/week   Barriers to discharge Inaccessible home environment Pt has 3 steps to enter to get into home    Co-evaluation               End of Session Equipment Utilized During Treatment: Gait belt Activity Tolerance: Patient limited by pain;Other (comment) (nausea) Patient left: in chair;with call bell/phone within reach;with family/visitor present Nurse Communication: Mobility status         Time: 0915-1003 PT Time Calculation (min) (ACUTE ONLY): 48 min   Charges:   PT Evaluation $Initial PT Evaluation Tier I: 1 Procedure PT Treatments $Therapeutic Exercise: 8-22 mins   PT G Codes:        Starkisha Tullis A Rayven Hendrickson 07/01/2014, 10:33 AM Wray Kearns, PT, DPT [phone]

## 2014-07-01 NOTE — Progress Notes (Signed)
Physical Therapy Treatment Patient Details Name: Sarah Blackwell MRN: [id number] DOB: 1955-05-20 Today's Date: 07/01/2014    History of Present Illness Patient is a 59 y/o female s/p R THA. PMH includes HTN, HLD, anxiety, ca.    PT Comments    Patient progressing slowly towards PT goals. Continues to have increased pain and light headedness with all mobility. Pt ambulating 30' Min guard assist for safety. Difficulty getting in/out of bed and performing AROM RLE. Husband present during session - reviewed HEP and transfer techniques. Pt needs to be able to negotiate 2 steps to enter home. Not able to tolerate this session. Recommend 1 additional night in hospital until light headedness resolves, pain improves and pt able to tolerate more activity. Will continue to follow,   Follow Up Recommendations  Home health PT;Supervision/Assistance - 24 hour     Equipment Recommendations  None recommended by PT    Recommendations for Other Services       Precautions / Restrictions Precautions Precautions: Fall Precaution Comments: Direct anterior approach. Restrictions Weight Bearing Restrictions: Yes RLE Weight Bearing: Weight bearing as tolerated    Mobility  Bed Mobility Overal bed mobility: Needs Assistance Bed Mobility: Supine to Sit;Sit to Supine     Supine to sit: Min assist Sit to supine: Min assist   General bed mobility comments: Min A to bring RLE to EOB. Assist to bring RLE into bed (husband assisted); instructed pt to use sheet as leg lifter. HOB flat, no use of rails.   Transfers Overall transfer level: Needs assistance Equipment used: Rolling walker (2 wheeled) Transfers: Sit to/from Stand Sit to Stand: Min guard         General transfer comment: Min guard for safety. Stood from [employer], from chair x2, from toilet x1.  Ambulation/Gait Ambulation/Gait assistance: Min guard Ambulation Distance (Feet):  (+ 20 + 15') Assistive device: Rolling walker (2  wheeled) Gait Pattern/deviations: Step-to pattern;Step-through pattern;Decreased stance time - right;Decreased step length - left;Trunk flexed;Antalgic   Gait velocity interpretation: Below normal speed for age/gender General Gait Details: Pt with difficulty advancing RLE during swing phase. Cues for RW management/proximity.    Stairs            Wheelchair Mobility    Modified Rankin (Stroke Patients Only)       Balance Overall balance assessment: Needs assistance Sitting-balance support: Feet supported;No upper extremity supported Sitting balance-Leahy Scale: Fair     Standing balance support: During functional activity Standing balance-Leahy Scale: Poor                      Cognition Arousal/Alertness: Awake/alert Behavior During Therapy: WFL for tasks assessed/performed;Anxious Overall Cognitive Status: Within Functional Limits for tasks assessed                      Exercises Total Joint Exercises Ankle Circles/Pumps: Both;15 reps;Supine Quad Sets: Right;10 reps;Supine Gluteal Sets: Both;5 reps;Supine Short Arc Quad: Right;10 reps;Supine Heel Slides: Right;AAROM;10 reps;Supine Hip ABduction/ADduction: AAROM;Right;10 reps;Supine Bridges: Both;5 reps;Supine    General Comments        Pertinent Vitals/Pain Pain Assessment: Faces Faces Pain Scale: Hurts whole lot Pain Location: Right hip with movement Pain Descriptors / Indicators: Sore;Aching;Grimacing Pain Intervention(s): Limited activity within patient's tolerance;Monitored during session;Premedicated before session;Repositioned    Home Living                      Prior Function  PT Goals (current goals can now be found in the care plan section) Progress towards PT goals: Progressing toward goals    Frequency  7X/week    PT Plan Current plan remains appropriate    Co-evaluation             End of Session Equipment Utilized During Treatment: Gait  belt Activity Tolerance: Patient limited by pain;Other (comment) (lightheadedness) Patient left: in bed;with call bell/phone within reach;with bed alarm set;with family/visitor present     Time: 1430-1515 PT Time Calculation (min) (ACUTE ONLY): 45 min  Charges:  $Gait Training: 8-22 mins $Therapeutic Exercise: 8-22 mins $Therapeutic Activity: 8-22 mins                    G Codes:      Brantlee Hinde A Emmanuelle Hibbitts 07/01/2014, 3:32 PM Wray Kearns, [place], DPT [phone]

## 2014-07-02 LAB — CBC
HCT: 30.7 % — ABNORMAL LOW (ref 36.0–46.0)
Hemoglobin: 10.2 g/dL — ABNORMAL LOW (ref 12.0–15.0)
MCH: 30.8 pg (ref 26.0–34.0)
MCHC: 33.2 g/dL (ref 30.0–36.0)
MCV: 92.7 fL (ref 78.0–100.0)
PLATELETS: 150 10*3/uL (ref 150–400)
RBC: 3.31 MIL/uL — ABNORMAL LOW (ref 3.87–5.11)
RDW: 12.4 % (ref 11.5–15.5)
WBC: 5.2 10*3/uL (ref 4.0–10.5)

## 2014-07-02 MED ORDER — METHOCARBAMOL 500 MG PO TABS: 500.0000 mg | ORAL_TABLET | Freq: Four times a day (QID) | ORAL | Status: DC | PRN

## 2014-07-02 MED ORDER — OXYCODONE HCL 5 MG PO TABS: 5.0000 mg | ORAL_TABLET | ORAL | Status: DC | PRN

## 2014-07-02 MED ORDER — ASPIRIN 325 MG PO TBEC: 325.0000 mg | DELAYED_RELEASE_TABLET | Freq: Every day | ORAL | Status: DC

## 2014-07-02 MED ORDER — IBUPROFEN 800 MG PO TABS: 800.0000 mg | ORAL_TABLET | Freq: Three times a day (TID) | ORAL | Status: DC | PRN

## 2014-07-02 NOTE — Progress Notes (Signed)
Subjective: 2 Days Post-Op Procedure(s) (LRB): RIGHT TOTAL HIP ARTHROPLASTY ANTERIOR APPROACH (Right) Patient reports pain as moderate.  Asymptomatic acute blood loss anemia.  Objective: Vital signs in last 24 hours: Temp:  [98 F (36.7 C)-98.4 F (36.9 C)] 98.2 F (36.8 C) (06/23 0550) Pulse Rate:  [56-67] 62 (06/23 0550) Resp:  [16-18] 18 (06/23 0550) BP: (104-120)/(55-68) 104/63 mmHg (06/23 0550) SpO2:  [97 %-98 %] 97 % (06/23 0550)  Intake/Output from previous day: 06/22 0701 - 06/23 0700 In: 360 [P.O.:360] Out: -  Intake/Output this shift: Total I/O In: 240 [P.O.:240] Out: -    Recent Labs  07/01/14 0541 07/02/14 0406  HGB 11.2* 10.2*    Recent Labs  07/01/14 0541 07/02/14 0406  WBC 5.6 5.2  RBC 3.68* 3.31*  HCT 33.8* 30.7*  PLT 179 150    Recent Labs  07/01/14 0541  NA 139  K 4.4  CL 102  CO2 28  BUN 10  CREATININE 0.60  GLUCOSE 187*  CALCIUM 9.1   No results for input(s): LABPT, INR in the last 72 hours.  Sensation intact distally Intact pulses distally Dorsiflexion/Plantar flexion intact Incision: scant drainage  Assessment/Plan: 2 Days Post-Op Procedure(s) (LRB): RIGHT TOTAL HIP ARTHROPLASTY ANTERIOR APPROACH (Right) Up with therapy Discharge home with home health today.  Mcarthur Rossetti 07/02/2014, 12:01 PM

## 2014-07-02 NOTE — Discharge Planning (Signed)
Pt IV removed. DC papers given and explained. Informed of needed FU appts. Scripts given. VSS and RN assessment indicates stability. Pt wheeled to care and family will be transporting home.

## 2014-07-02 NOTE — Discharge Summary (Signed)
Patient ID: Sarah Blackwell MRN: [id number] DOB/AGE: 03-30-1955 59 y.o.  Admit date: 06/30/2014 Discharge date: 07/02/2014  Admission Diagnoses:  Principal Problem:   Osteoarthritis of right hip Active Problems:   Status post total replacement of right hip   Discharge Diagnoses:  Same  Past Medical History  Diagnosis Date  . Hyperlipidemia   . Allergic rhinitis   . PCOS (polycystic ovarian syndrome)   . Perimenopausal   . Anxiety   . Fluid retention   . Hypertension     never has taken medications  . Hypothyroidism   . Headache     migraines when she was younger  . Arthritis   . Cancer     skin cancer  . Rosacea   . Complication of anesthesia     difficulty waking up after surgery as a child.   . Pre-diabetes     Surgeries: Procedure(s): RIGHT TOTAL HIP ARTHROPLASTY ANTERIOR APPROACH on 06/30/2014   Consultants:    Discharged Condition: Improved  Hospital Course: Sharece Fleischhacker is an 59 y.o. female who was admitted 06/30/2014 for operative treatment ofOsteoarthritis of right hip. Patient has severe unremitting pain that affects sleep, daily activities, and work/hobbies. After pre-op clearance the patient was taken to the operating room on 06/30/2014 and underwent  Procedure(s): RIGHT TOTAL HIP ARTHROPLASTY ANTERIOR APPROACH.    Patient was given perioperative antibiotics: Anti-infectives    Start     Dose/Rate Route Frequency Ordered Stop   07/01/14 0300  vancomycin (VANCOCIN) IVPB 1000 mg/200 mL premix     1,000 mg 200 mL/hr over 60 Minutes Intravenous Every 12 hours 06/30/14 1938 07/01/14 0454   06/30/14 1300  ceFAZolin (ANCEF) IVPB 2 g/50 mL premix     2 g 100 mL/hr over 30 Minutes Intravenous To [facility]Stay [facility] 06/29/14 1429 06/30/14 1514       Patient was given sequential compression devices, early ambulation, and chemoprophylaxis to prevent DVT.  Patient benefited maximally from hospital stay and there were no complications.    Recent vital signs:  Patient Vitals for the past 24 hrs:  BP Temp Temp src Pulse Resp SpO2  07/02/14 0550 104/63 mmHg 98.2 F (36.8 C) Oral 62 18 97 %  07/01/14 2157 (!) 116/55 mmHg 98.4 F (36.9 C) Oral 67 16 98 %  07/01/14 1345 120/68 mmHg 98 F (36.7 C) Oral (!) 56 18 97 %     Recent laboratory studies:  Recent Labs  07/01/14 0541 07/02/14 0406  WBC 5.6 5.2  HGB 11.2* 10.2*  HCT 33.8* 30.7*  PLT 179 150  NA 139  --   K 4.4  --   CL 102  --   CO2 28  --   BUN 10  --   CREATININE 0.60  --   GLUCOSE 187*  --   CALCIUM 9.1  --      Discharge Medications:     Medication List    STOP taking these medications        naproxen 500 MG tablet  Commonly known as:  NAPROSYN     naproxen sodium 220 MG tablet  Commonly known as:  ANAPROX     predniSONE 10 MG tablet  Commonly known as:  DELTASONE      TAKE these medications        ALPRAZolam 0.5 MG tablet  Commonly known as:  XANAX  Take 0.5 mg by mouth at bedtime as needed for sleep.     aspirin 325 MG EC tablet  Take 1  tablet (325 mg total) by mouth daily.     Biotin 1000 MCG tablet  Take 1,000 mcg by mouth daily.     Calcium-Magnesium-Vitamin D 400-166.7-133.3 MG-MG-UNIT Tabs  Take 1-2 tablets by mouth daily.     estradiol-norethindrone 0.05-0.14 MG/DAY  Commonly known as:  COMBIPATCH  Place 1 patch onto the skin 2 (two) times a week. Days vary     eszopiclone 3 MG Tabs  Generic drug:  Eszopiclone  Take 3 mg by mouth at bedtime as needed (sleep).     EVENING PRIMROSE OIL PO  Take 1 tablet by mouth daily.     fluticasone 50 MCG/ACT nasal spray  Commonly known as:  FLONASE  Place 1-2 sprays into both nostrils daily as needed for allergies.     Glucosamine-Chondroitin-MSM Tabs  Take 1-2 tablets by mouth daily.     ibuprofen 800 MG tablet  Commonly known as:  ADVIL  Take 1 tablet (800 mg total) by mouth every 8 (eight) hours as needed for moderate pain.     L-TRYPTOPHAN PO  Take 0.5-1 tablets by mouth daily.      levothyroxine 75 MCG tablet  Commonly known as:  SYNTHROID, LEVOTHROID  Take 75 mcg by mouth daily before breakfast.     liothyronine 5 MCG tablet  Commonly known as:  CYTOMEL  Take 5 mcg by mouth daily.     MAGNESIUM PO  Take 1 tablet by mouth 2 (two) times daily. OTC Magnesium Malate     methocarbamol 500 MG tablet  Commonly known as:  ROBAXIN  Take 1 tablet (500 mg total) by mouth every 6 (six) hours as needed for muscle spasms.     Omega-3 Fish Oil-Vitamin D3 1200-1000 MG-UNIT Caps  Take 1 capsule by mouth daily.     OVER THE COUNTER MEDICATION  Take 500 mcg by mouth daily. OTC Iodine supplement     oxyCODONE 5 MG immediate release tablet  Commonly known as:  Oxy IR/ROXICODONE  Take 1-3 tablets (5-15 mg total) by mouth every 3 (three) hours as needed for breakthrough pain.     Probiotic Caps  Take 1 capsule by mouth daily.     Taurine 500 MG Caps  Take 500-1,000 mg by mouth daily.     TURMERIC PO  Take 1 tablet by mouth. 2 to 3 times a day     VITAMIN C PO  Take 1 tablet by mouth daily.     Vitamin D3 Liqd  Take 2 drops by mouth 3 (three) times daily.        Diagnostic Studies: Dg Hip Port Unilat With Pelvis 1v Right  06/30/2014   CLINICAL DATA:  Status post right hip replacement  EXAM: RIGHT HIP (WITH PELVIS) 1 VIEW PORTABLE  COMPARISON:  None.  FINDINGS: Right total hip arthroplasty in satisfactory position.  Mild degenerative changes of the left hip.  Visualized bony pelvis appears intact.  No fracture or dislocation is seen.  IMPRESSION: Right total hip arthroplasty in satisfactory position.   Electronically Signed   By: Julian Hy M.D.   On: 06/30/2014 18:53   Dg Hip Operative Unilat With Pelvis Right  06/30/2014   CLINICAL DATA:  Right hip arthroplasty.  EXAM: OPERATIVE right HIP (WITH PELVIS IF PERFORMED) 1 VIEWS  TECHNIQUE: Fluoroscopic spot image(s) were submitted for interpretation post-operatively.  FLUOROSCOPY TIME:  Radiation Exposure Index (as  provided by the fluoroscopic device):  If the device does not provide the exposure index:  Fluoroscopy Time:  53  seconds  Number of Acquired Images:  4  COMPARISON:  01/28/2014  FINDINGS: Images demonstrate be 4 in after right hip arthroplasty. On the postoperative images there is a total hip arthroplasty device with hardware components in anatomic alignment. No periprosthetic fracture or subluxation.  IMPRESSION: 1. Status post right hip arthroplasty.   Electronically Signed   By: Kerby Moors M.D.   On: 06/30/2014 17:16    Disposition: to home      Discharge Instructions    Discharge patient    Complete by:  As directed            Follow-up Information    Follow up with Mcarthur Rossetti, MD In 2 weeks.   Specialty:  Orthopedic Surgery   Contact information:   [place] [address] [phone]        Signed: Mcarthur Rossetti 07/02/2014, 12:07 PM

## 2014-07-02 NOTE — Care Management Note (Signed)
Case Management Note  Patient Details  Name: Sarah Blackwell MRN: [id number] Date of Birth: 1955/11/24  Subjective/Objective:      S/p right total hip arthroplasty              Action/Plan: Set up with [facility] for HHPT by MD office. Spoke with patient, no change in discharge plan. Patient states that she has a rolling walker and 3N1 at home and her husband will be available to assist after discharge.     Expected Discharge Date:                  Expected Discharge Plan:  [place]  In-House Referral:  NA  Discharge planning Services  CM Consult  Post Acute Care Choice:  Home Health Choice offered to:  Patient  DME Arranged:    DME Agency:     HH Arranged:  PT HH Agency:  [facility]  Status of Service:  Completed, signed off  Medicare Important Message Given:    Date Medicare IM Given:    Medicare IM give by:    Date Additional Medicare IM Given:    Additional Medicare Important Message give by:     If discussed at Center of Stay Meetings, dates discussed:    Additional Comments:  Nila Nephew, RN 07/02/2014, 12:25 PM

## 2014-07-02 NOTE — Evaluation (Signed)
Occupational Therapy Evaluation Patient Details Name: Sarah Blackwell MRN: [id number] DOB: 11-Jul-1955 Today's Date: 07/02/2014    History of Present Illness Patient is a 59 y/o female s/p R THA. PMH includes HTN, HLD, anxiety, ca.   Clinical Impression   Pt with decline in function and safety with ADLs and ADL mobility. Pt is at mod A level with LB ADLs and sup with mobility using RW. Pt will have 24 hour assist from her husband at home and has DME. All education completed and no further acute OT indicated at this time.    Follow Up Recommendations  No OT follow up    Equipment Recommendations  None recommended by OT    Recommendations for Other Services       Precautions / Restrictions Precautions Precautions: Fall Precaution Comments: Direct anterior approach. Restrictions Weight Bearing Restrictions: Yes RLE Weight Bearing: Weight bearing as tolerated      Mobility Bed Mobility Overal bed mobility: Needs Assistance Bed Mobility: Supine to Sit;Sit to Supine     Supine to sit: Supervision Sit to supine: Min assist   General bed mobility comments: Supervision to get to EOB, HOB flat, no rails. Min A to bring RLE onto bed   Transfers Overall transfer level: Needs assistance Equipment used: Rolling walker (2 wheeled) Transfers: Sit to/from Stand Sit to Stand: Supervision         General transfer comment: Supervision for safety.     Balance Overall balance assessment: Needs assistance Sitting-balance support: No upper extremity supported;Feet supported Sitting balance-Leahy Scale: Good     Standing balance support: During functional activity Standing balance-Leahy Scale: Fair                              ADL Overall ADL's : Needs assistance/impaired     Grooming: Wash/dry hands;Wash/dry face;Supervision/safety;Standing   Upper Body Bathing: Set up;Sitting   Lower Body Bathing: Moderate assistance;With caregiver independent assisting    Upper Body Dressing : Set up;Sitting   Lower Body Dressing: Moderate assistance;With caregiver independent assisting   Toilet Transfer: Supervision/safety;RW;Grab bars;Comfort height toilet   Toileting- Clothing Manipulation and Hygiene: Minimal assistance;Sit to/from stand   Tub/ Banker: Supervision/safety;3 in 1;Grab bars;Rolling walker   Functional mobility during ADLs: Supervision/safety       Vision  no change from baseline   Perception Perception Perception Tested?: No   Praxis Praxis Praxis tested?: Not tested    Pertinent Vitals/Pain Pain Assessment: 0-10 Pain Score: 2  Faces Pain Scale: Hurts a little bit Pain Location: R hip Pain Descriptors / Indicators: Sore Pain Intervention(s): Monitored during session;Repositioned     Hand Dominance Right   Extremity/Trunk Assessment Upper Extremity Assessment Upper Extremity Assessment: Overall WFL for tasks assessed   Lower Extremity Assessment Lower Extremity Assessment: Defer to PT evaluation   Cervical / Trunk Assessment Cervical / Trunk Assessment: Normal   Communication Communication Communication: No difficulties   Cognition Arousal/Alertness: Awake/alert Behavior During Therapy: WFL for tasks assessed/performed Overall Cognitive Status: Within Functional Limits for tasks assessed                     General Comments   pt pleasant and cooperative, family supportive                 Home Living Family/patient expects to be discharged to:: Private residence Living Arrangements: Spouse/significant other Available Help at Discharge: Family;Available 24 hours/day Type of Home: House Home Access:  Stairs to enter [employer] of Steps: 3 Entrance Stairs-Rails: None Home Layout: Two level;Able to live on main level with bedroom/bathroom     Bathroom Shower/Tub: Occupational psychologist: Standard     Home Equipment: Environmental consultant - 2 wheels;Cane - single  point;Bedside commode;Shower seat - built in          Prior Functioning/Environment Level of Independence: Independent             OT Diagnosis: Acute pain   OT Problem List: Decreased activity tolerance;Pain;Impaired balance (sitting and/or standing);Decreased knowledge of use of DME or AE   OT Treatment/Interventions:      OT Goals(Current goals can be found in the care plan section) Acute Rehab OT Goals Patient Stated Goal: to return home OT Goal Formulation: With patient/family  OT Frequency:     Barriers to D/C:  none                        End of Session Equipment Utilized During Treatment: Rolling walker;Other (comment) (3 in1 )  Activity Tolerance: Patient tolerated treatment well Patient left: in bed;with call bell/phone within reach;with family/visitor present   Time: 0998-3382 OT Time Calculation (min): 26 min Charges:  OT General Charges $OT Visit: 1 Procedure OT Evaluation $Initial OT Evaluation Tier I: 1 Procedure OT Treatments $Therapeutic Activity: 8-22 mins G-Codes:    Britt Bottom 07/02/2014, 1:01 PM

## 2014-07-02 NOTE — Progress Notes (Signed)
Physical Therapy Treatment Patient Details Name: Sarah Blackwell MRN: [id number] DOB: 1955/01/14 Today's Date: 07/02/2014    History of Present Illness Patient is a 59 y/o female s/p R THA. PMH includes HTN, HLD, anxiety, ca.    PT Comments    Patient progressing well towards PT goals. Pain more controlled today allowing pt to tolerate more activity. Performed family training on stairs. Tolerating ambulation with Min guard assist for safety. No dizziness or light headedness today. Reviewed HEP. Discussed how to safely get in/out of car. Pt safe to discharge from a mobility standpoint. Will follow up in PM session if pt still in hospital to maximize independence and mobility.   Follow Up Recommendations  Home health PT;Supervision/Assistance - 24 hour     Equipment Recommendations  None recommended by PT    Recommendations for Other Services       Precautions / Restrictions Precautions Precautions: Fall Precaution Comments: Direct anterior approach. Restrictions Weight Bearing Restrictions: Yes RLE Weight Bearing: Weight bearing as tolerated    Mobility  Bed Mobility Overal bed mobility: Needs Assistance Bed Mobility: Supine to Sit;Sit to Supine     Supine to sit: Supervision Sit to supine: Min assist   General bed mobility comments: Supervision to get to EOB, HOB flat, no rails. Min A to bring RLE into bed (husband assisted). Instructed pt to use LLE to assist bringing RLE into bed.   Transfers Overall transfer level: Needs assistance Equipment used: Rolling walker (2 wheeled) Transfers: Sit to/from Stand Sit to Stand: Supervision         General transfer comment: Supervision for safety.   Ambulation/Gait Ambulation/Gait assistance: Min guard Ambulation Distance (Feet): 150 Feet Assistive device: Rolling walker (2 wheeled) Gait Pattern/deviations: Step-to pattern;Step-through pattern;Antalgic;Decreased stance time - right;Decreased step length - left   Gait  velocity interpretation: Below normal speed for age/gender General Gait Details: Cues for heel strike RLE during initial contact and for step through gait.    Stairs Stairs: Yes Stairs assistance: Min assist Stair Management: Forwards Number of Stairs: 3 (x2 bouts) General stair comments: Cues for technique. Used rails on first bout and used husband as rail on second bout to prepare pt for home.   Wheelchair Mobility    Modified Rankin (Stroke Patients Only)       Balance Overall balance assessment: Needs assistance Sitting-balance support: Feet supported;No upper extremity supported Sitting balance-Leahy Scale: Good     Standing balance support: During functional activity Standing balance-Leahy Scale: Fair                      Cognition Arousal/Alertness: Awake/alert Behavior During Therapy: WFL for tasks assessed/performed Overall Cognitive Status: Within Functional Limits for tasks assessed                      Exercises Total Joint Exercises Ankle Circles/Pumps: Both;15 reps;Supine Quad Sets: Right;5 reps;Supine Gluteal Sets: Both;10 reps;Supine Short Arc Quad: Right;5 reps;Supine Heel Slides: Right;5 reps;Supine Hip ABduction/ADduction: Right;5 reps;Supine    General Comments General comments (skin integrity, edema, etc.): Husband present during session.      Pertinent Vitals/Pain Pain Assessment: Faces Faces Pain Scale: Hurts a little bit Pain Location: right hip with movement Pain Descriptors / Indicators: Sore Pain Intervention(s): Monitored during session;Repositioned;Premedicated before session    Home Living Family/patient expects to be discharged to:: Private residence Living Arrangements: Spouse/significant other Available Help at Discharge: Family;Available 24 hours/day Type of Home: House Home Access: Stairs to enter Entrance  Stairs-Rails: None Home Layout: Two level;Able to live on main level with bedroom/bathroom Home  Equipment: Gilford Rile - 2 wheels;Cane - single point;Bedside commode;Shower seat - built in      Prior Function Level of Independence: Independent          PT Goals (current goals can now be found in the care plan section) Progress towards PT goals: Progressing toward goals    Frequency  7X/week    PT Plan Current plan remains appropriate    Co-evaluation             End of Session Equipment Utilized During Treatment: Gait belt Activity Tolerance: Patient tolerated treatment well Patient left: in bed;with call bell/phone within reach;with family/visitor present     Time: 1025-1051 PT Time Calculation (min) (ACUTE ONLY): 26 min  Charges:  $Gait Training: 23-37 mins                    G Codes:      Olander Friedl A Aasir Daigler 07/02/2014, 11:07 AM Wray Kearns, [place], DPT [phone]

## 2015-02-12 ENCOUNTER — Other Ambulatory Visit (HOSPITAL_COMMUNITY): Payer: Self-pay | Admitting: Orthopaedic Surgery

## 2015-02-16 ENCOUNTER — Inpatient Hospital Stay (HOSPITAL_COMMUNITY): Admission: RE | Admit: 2015-02-16 | Payer: BLUE CROSS/BLUE SHIELD | Source: Ambulatory Visit

## 2015-02-17 ENCOUNTER — Other Ambulatory Visit: Payer: Self-pay | Admitting: Orthopaedic Surgery

## 2015-02-17 DIAGNOSIS — M25552 Pain in left hip: Secondary | ICD-10-CM

## 2015-03-02 ENCOUNTER — Other Ambulatory Visit: Payer: BLUE CROSS/BLUE SHIELD

## 2015-03-03 ENCOUNTER — Ambulatory Visit
Admission: RE | Admit: 2015-03-03 | Discharge: 2015-03-03 | Disposition: A | Discharge status: Home / Self Care | Payer: BLUE CROSS/BLUE SHIELD | Source: Ambulatory Visit | Attending: Orthopaedic Surgery | Admitting: Orthopaedic Surgery

## 2015-03-03 DIAGNOSIS — M25552 Pain in left hip: Secondary | ICD-10-CM

## 2015-03-03 MED ORDER — IOHEXOL 180 MG/ML  SOLN
15.0000 mL | Freq: Once | INTRAMUSCULAR | Status: AC | PRN
  Administered 2015-03-03: 15 mL via INTRA_ARTICULAR

## 2015-03-05 ENCOUNTER — Encounter (HOSPITAL_COMMUNITY)
Admission: RE | Admit: 2015-03-05 | Discharge: 2015-03-05 | Disposition: A | Discharge status: Home / Self Care | Payer: BLUE CROSS/BLUE SHIELD | Source: Ambulatory Visit | Attending: Orthopaedic Surgery | Admitting: Orthopaedic Surgery

## 2015-03-05 ENCOUNTER — Encounter (HOSPITAL_COMMUNITY): Payer: Self-pay

## 2015-03-05 DIAGNOSIS — Z01812 Encounter for preprocedural laboratory examination: Secondary | ICD-10-CM | POA: Diagnosis present

## 2015-03-05 DIAGNOSIS — M1612 Unilateral primary osteoarthritis, left hip: Secondary | ICD-10-CM | POA: Diagnosis not present

## 2015-03-05 HISTORY — DX: Nausea with vomiting, unspecified: R11.2

## 2015-03-05 HISTORY — DX: Presence of spectacles and contact lenses: Z97.3

## 2015-03-05 HISTORY — DX: Other specified postprocedural states: Z98.890

## 2015-03-05 LAB — SURGICAL PCR SCREEN
MRSA, PCR: NEGATIVE
Staphylococcus aureus: POSITIVE — AB

## 2015-03-05 LAB — CBC
HCT: 40.1 % (ref 36.0–46.0)
Hemoglobin: 13.1 g/dL (ref 12.0–15.0)
MCH: 30 pg (ref 26.0–34.0)
MCHC: 32.7 g/dL (ref 30.0–36.0)
MCV: 91.8 fL (ref 78.0–100.0)
PLATELETS: 206 10*3/uL (ref 150–400)
RBC: 4.37 MIL/uL (ref 3.87–5.11)
RDW: 12.3 % (ref 11.5–15.5)
WBC: 4 10*3/uL (ref 4.0–10.5)

## 2015-03-05 LAB — BASIC METABOLIC PANEL
Anion gap: 10 (ref 5–15)
BUN: 18 mg/dL (ref 6–20)
CHLORIDE: 106 mmol/L (ref 101–111)
CO2: 26 mmol/L (ref 22–32)
CREATININE: 0.75 mg/dL (ref 0.44–1.00)
Calcium: 9.6 mg/dL (ref 8.9–10.3)
GFR calc Af Amer: >60 mL/min (ref >60)
GFR calc non Af Amer: >60 mL/min (ref >60)
GLUCOSE: 95 mg/dL (ref 65–99)
Potassium: 3.9 mmol/L (ref 3.5–5.1)
Sodium: 142 mmol/L (ref 135–145)

## 2015-03-05 NOTE — Progress Notes (Signed)
Pt denies SOB, chest pain, and being under the care of a cardiologist. Pt denies having a cardiac cath and echo but stated that a stress test was ordered by PCP and performed at another location ( unknown); records requested from PCP, Dr. Cari Caraway of [facility].

## 2015-03-05 NOTE — Pre-Procedure Instructions (Signed)
Sarah Blackwell  03/05/2015      [id number] [place] Sarah Blackwell, [place] [id number] Sarah Blackwell [place] [id number] Phone: [phone] Fax: [phone]    Your procedure is scheduled on Tuesday, March 16, 2015  Report to [facility] Admitting at 1:15 P.M.  Call this number if you have problems the morning of surgery:  [phone]   Remember:  Do not eat food or drink liquids after midnight Monday, March 15, 2015  Take these medicines the morning of surgery with A SIP OF WATER :levothyroxine (SYNTHROID, LEVOTHROID), liothyronine (CYTOMEL)  Stop taking Aspirin, vitamins, fish oil and herbal medications. Do not take any NSAIDs ie: Ibuprofen, Advil, Naproxen ( Naprosyn), BC and Goody Powder or any medication containing Aspirin; stop Wednesday, March 10, 2015   Do not wear jewelry, make-up or nail polish.  Do not wear lotions, powders, or perfumes.  You may not wear deodorant.  Do not shave 48 hours prior to surgery.    Do not bring valuables to the hospital.  [facility] is not responsible for any belongings or valuables.  Contacts, dentures or bridgework may not be worn into surgery.  Leave your suitcase in the car.  After surgery it may be brought to your room.  For patients admitted to the hospital, discharge time will be determined by your treatment team.  Patients discharged the day of surgery will not be allowed to drive home.   Name and phone number of your driver:   Special instructions: [facility] - Preparing for Surgery  Before surgery, you can play an important role.  Because skin is not sterile, your skin needs to be as free of germs as possible.  You can reduce the number of germs on you skin by washing with CHG (chlorahexidine gluconate) soap before surgery.  CHG is an antiseptic cleaner which kills germs and bonds with the skin to continue killing germs even after washing.  Please DO NOT use if you have an allergy to CHG or  antibacterial soaps.  If your skin becomes reddened/irritated stop using the CHG and inform your nurse when you arrive at Short Stay.  Do not shave (including legs and underarms) for at least 48 hours prior to the first CHG shower.  You may shave your face.  Please follow these instructions carefully:   1.  Shower with CHG Soap the night before surgery and the morning of Surgery.  2.  If you choose to wash your hair, wash your hair first as usual with your normal shampoo.  3.  After you shampoo, rinse your hair and body thoroughly to remove the Shampoo.  4.  Use CHG as you would any other liquid soap.  You can apply chg directly  to the skin and wash gently with scrungie or a clean washcloth.  5.  Apply the CHG Soap to your body ONLY FROM THE NECK DOWN.  Do not use on open wounds or open sores.  Avoid contact with your eyes, ears, mouth and genitals (private parts).  Wash genitals (private parts) with your normal soap.  6.  Wash thoroughly, paying special attention to the area where your surgery will be performed.  7.  Thoroughly rinse your body with warm water from the neck down.  8.  DO NOT shower/wash with your normal soap after using and rinsing off the CHG Soap.  9.  Pat yourself dry with a clean towel.  10.  Wear clean pajamas.            11.  Place clean sheets on your bed the night of your first shower and do not sleep with pets.  Day of Surgery  Do not apply any lotions/deodorants the morning of surgery.  Please wear clean clothes to the hospital/surgery center.  Please read over the following fact sheets that you were given. Pain Booklet, Coughing and Deep Breathing, MRSA Information and Surgical Site Infection Prevention

## 2015-03-09 NOTE — Progress Notes (Signed)
Spoke with Ivin Booty at [place] at [facility] and re-requested cardiac studies and ov note.

## 2015-03-15 MED ORDER — SODIUM CHLORIDE 0.9 % IV SOLN
1000.0000 mg | INTRAVENOUS | Status: AC
  Administered 2015-03-16: 1000 mg via INTRAVENOUS
  Filled 2015-03-15 (×2): qty 10

## 2015-03-15 NOTE — Progress Notes (Signed)
Patient notified to arrive at 12:30

## 2015-03-16 ENCOUNTER — Inpatient Hospital Stay (HOSPITAL_COMMUNITY): Payer: BLUE CROSS/BLUE SHIELD | Admitting: Anesthesiology

## 2015-03-16 ENCOUNTER — Encounter (HOSPITAL_COMMUNITY): Payer: Self-pay | Admitting: Surgery

## 2015-03-16 ENCOUNTER — Inpatient Hospital Stay (HOSPITAL_COMMUNITY)
Admission: AD | Admit: 2015-03-16 | Discharge: 2015-03-18 | DRG: 470 | Disposition: A | Discharge status: Home Health | Payer: BLUE CROSS/BLUE SHIELD | Source: Ambulatory Visit | Attending: Orthopaedic Surgery | Admitting: Orthopaedic Surgery

## 2015-03-16 ENCOUNTER — Encounter (HOSPITAL_COMMUNITY)
Admission: AD | Disposition: A | Discharge status: Home Health | Payer: Self-pay | Source: Ambulatory Visit | Attending: Orthopaedic Surgery

## 2015-03-16 ENCOUNTER — Inpatient Hospital Stay (HOSPITAL_COMMUNITY): Payer: BLUE CROSS/BLUE SHIELD

## 2015-03-16 DIAGNOSIS — L719 Rosacea, unspecified: Secondary | ICD-10-CM | POA: Diagnosis present

## 2015-03-16 DIAGNOSIS — E785 Hyperlipidemia, unspecified: Secondary | ICD-10-CM | POA: Diagnosis present

## 2015-03-16 DIAGNOSIS — Z85828 Personal history of other malignant neoplasm of skin: Secondary | ICD-10-CM

## 2015-03-16 DIAGNOSIS — R7303 Prediabetes: Secondary | ICD-10-CM | POA: Diagnosis present

## 2015-03-16 DIAGNOSIS — F419 Anxiety disorder, unspecified: Secondary | ICD-10-CM | POA: Diagnosis present

## 2015-03-16 DIAGNOSIS — M1612 Unilateral primary osteoarthritis, left hip: Secondary | ICD-10-CM | POA: Diagnosis present

## 2015-03-16 DIAGNOSIS — Z96642 Presence of left artificial hip joint: Secondary | ICD-10-CM

## 2015-03-16 DIAGNOSIS — E039 Hypothyroidism, unspecified: Secondary | ICD-10-CM | POA: Diagnosis present

## 2015-03-16 DIAGNOSIS — Z419 Encounter for procedure for purposes other than remedying health state, unspecified: Secondary | ICD-10-CM

## 2015-03-16 DIAGNOSIS — M25552 Pain in left hip: Secondary | ICD-10-CM | POA: Diagnosis present

## 2015-03-16 HISTORY — PX: TOTAL HIP ARTHROPLASTY: SHX124

## 2015-03-16 SURGERY — ARTHROPLASTY, HIP, TOTAL, ANTERIOR APPROACH
Anesthesia: Monitor Anesthesia Care | Site: Hip | Laterality: Left

## 2015-03-16 MED ORDER — ASPIRIN EC 325 MG PO TBEC
325.0000 mg | DELAYED_RELEASE_TABLET | Freq: Two times a day (BID) | ORAL | Status: DC
  Administered 2015-03-17 – 2015-03-18 (×3): 325 mg via ORAL
  Filled 2015-03-16 (×3): qty 1

## 2015-03-16 MED ORDER — LEVOTHYROXINE SODIUM 75 MCG PO TABS
75.0000 ug | ORAL_TABLET | Freq: Every day | ORAL | Status: DC
  Administered 2015-03-17 – 2015-03-18 (×2): 75 ug via ORAL
  Filled 2015-03-16 (×2): qty 1

## 2015-03-16 MED ORDER — ACETAMINOPHEN 650 MG RE SUPP: 650.0000 mg | Freq: Four times a day (QID) | RECTAL | Status: DC | PRN

## 2015-03-16 MED ORDER — PHENYLEPHRINE HCL 10 MG/ML IJ SOLN
INTRAMUSCULAR | Status: DC | PRN
  Administered 2015-03-16: 40 ug via INTRAVENOUS
  Administered 2015-03-16: 80 ug via INTRAVENOUS
  Administered 2015-03-16: 40 ug via INTRAVENOUS
  Administered 2015-03-16: 80 ug via INTRAVENOUS

## 2015-03-16 MED ORDER — DEXTROSE 5 % IV SOLN
500.0000 mg | Freq: Four times a day (QID) | INTRAVENOUS | Status: DC | PRN
  Administered 2015-03-16: 500 mg via INTRAVENOUS
  Filled 2015-03-16 (×3): qty 5

## 2015-03-16 MED ORDER — SODIUM CHLORIDE 0.9 % IV SOLN
INTRAVENOUS | Status: DC
  Administered 2015-03-16 – 2015-03-17 (×2): via INTRAVENOUS

## 2015-03-16 MED ORDER — ONDANSETRON HCL 4 MG PO TABS: 4.0000 mg | ORAL_TABLET | Freq: Four times a day (QID) | ORAL | Status: DC | PRN

## 2015-03-16 MED ORDER — OXYCODONE HCL 5 MG PO TABS
5.0000 mg | ORAL_TABLET | ORAL | Status: DC | PRN
  Administered 2015-03-16 – 2015-03-17 (×2): 15 mg via ORAL
  Administered 2015-03-17: 5 mg via ORAL
  Administered 2015-03-17 (×3): 15 mg via ORAL
  Administered 2015-03-18 (×2): 10 mg via ORAL
  Filled 2015-03-16: qty 2
  Filled 2015-03-16 (×2): qty 3
  Filled 2015-03-16: qty 2
  Filled 2015-03-16 (×3): qty 3

## 2015-03-16 MED ORDER — LIOTHYRONINE SODIUM 5 MCG PO TABS
5.0000 ug | ORAL_TABLET | Freq: Every day | ORAL | Status: DC
  Administered 2015-03-17: 5 ug via ORAL
  Filled 2015-03-16 (×4): qty 1

## 2015-03-16 MED ORDER — 0.9 % SODIUM CHLORIDE (POUR BTL) OPTIME
TOPICAL | Status: DC | PRN
  Administered 2015-03-16: 1000 mL

## 2015-03-16 MED ORDER — ZOLPIDEM TARTRATE 5 MG PO TABS: 5.0000 mg | ORAL_TABLET | Freq: Every evening | ORAL | Status: DC | PRN

## 2015-03-16 MED ORDER — OXYCODONE HCL 5 MG PO TABS: 5.0000 mg | ORAL_TABLET | Freq: Once | ORAL | Status: DC | PRN

## 2015-03-16 MED ORDER — METOCLOPRAMIDE HCL 5 MG PO TABS: 5.0000 mg | ORAL_TABLET | Freq: Three times a day (TID) | ORAL | Status: DC | PRN

## 2015-03-16 MED ORDER — METHOCARBAMOL 500 MG PO TABS
500.0000 mg | ORAL_TABLET | Freq: Four times a day (QID) | ORAL | Status: DC | PRN
  Administered 2015-03-16 – 2015-03-18 (×5): 500 mg via ORAL
  Filled 2015-03-16 (×4): qty 1

## 2015-03-16 MED ORDER — ONDANSETRON HCL 4 MG/2ML IJ SOLN
INTRAMUSCULAR | Status: AC
  Filled 2015-03-16: qty 2

## 2015-03-16 MED ORDER — ACETAMINOPHEN 325 MG PO TABS
ORAL_TABLET | ORAL | Status: AC
  Administered 2015-03-16: 650 mg via ORAL
  Filled 2015-03-16: qty 2

## 2015-03-16 MED ORDER — PHENOL 1.4 % MT LIQD: 1.0000 | OROMUCOSAL | Status: DC | PRN

## 2015-03-16 MED ORDER — DIPHENHYDRAMINE HCL 12.5 MG/5ML PO ELIX: 12.5000 mg | ORAL_SOLUTION | ORAL | Status: DC | PRN

## 2015-03-16 MED ORDER — MIDAZOLAM HCL 2 MG/2ML IJ SOLN
INTRAMUSCULAR | Status: AC
  Filled 2015-03-16: qty 2

## 2015-03-16 MED ORDER — FENTANYL CITRATE (PF) 250 MCG/5ML IJ SOLN
INTRAMUSCULAR | Status: AC
  Filled 2015-03-16: qty 5

## 2015-03-16 MED ORDER — PROMETHAZINE HCL 25 MG/ML IJ SOLN
INTRAMUSCULAR | Status: AC
  Filled 2015-03-16: qty 1

## 2015-03-16 MED ORDER — MORPHINE SULFATE (PF) 2 MG/ML IV SOLN
2.0000 mg | INTRAVENOUS | Status: DC | PRN
  Administered 2015-03-16 (×4): 2 mg via INTRAVENOUS

## 2015-03-16 MED ORDER — FLUTICASONE PROPIONATE 50 MCG/ACT NA SUSP
1.0000 | Freq: Every day | NASAL | Status: DC | PRN
  Filled 2015-03-16: qty 16

## 2015-03-16 MED ORDER — ONDANSETRON HCL 4 MG/2ML IJ SOLN
INTRAMUSCULAR | Status: DC | PRN
  Administered 2015-03-16: 4 mg via INTRAVENOUS

## 2015-03-16 MED ORDER — FENTANYL CITRATE (PF) 100 MCG/2ML IJ SOLN
INTRAMUSCULAR | Status: DC | PRN
  Administered 2015-03-16 (×2): 50 ug via INTRAVENOUS

## 2015-03-16 MED ORDER — HYDROMORPHONE HCL 1 MG/ML IJ SOLN
0.2500 mg | INTRAMUSCULAR | Status: DC | PRN
  Administered 2015-03-16 (×2): 0.5 mg via INTRAVENOUS

## 2015-03-16 MED ORDER — OXYCODONE HCL 5 MG/5ML PO SOLN
5.0000 mg | Freq: Once | ORAL | Status: DC | PRN
Start: 2015-03-16 — End: 2015-03-16

## 2015-03-16 MED ORDER — MORPHINE SULFATE (PF) 2 MG/ML IV SOLN
2.0000 mg | INTRAVENOUS | Status: DC | PRN
  Administered 2015-03-16: 2 mg via INTRAVENOUS
  Administered 2015-03-16 – 2015-03-17 (×6): 4 mg via INTRAVENOUS
  Filled 2015-03-16: qty 2
  Filled 2015-03-16: qty 1
  Filled 2015-03-16 (×5): qty 2

## 2015-03-16 MED ORDER — MORPHINE SULFATE (PF) 2 MG/ML IV SOLN
INTRAVENOUS | Status: AC
  Administered 2015-03-16: 2 mg via INTRAVENOUS
  Filled 2015-03-16: qty 2

## 2015-03-16 MED ORDER — METHOCARBAMOL 500 MG PO TABS
ORAL_TABLET | ORAL | Status: AC
  Administered 2015-03-16: 500 mg via ORAL
  Filled 2015-03-16: qty 1

## 2015-03-16 MED ORDER — DOCUSATE SODIUM 100 MG PO CAPS
100.0000 mg | ORAL_CAPSULE | Freq: Two times a day (BID) | ORAL | Status: DC
  Administered 2015-03-16 – 2015-03-18 (×4): 100 mg via ORAL
  Filled 2015-03-16 (×4): qty 1

## 2015-03-16 MED ORDER — OXYCODONE HCL 5 MG PO TABS
ORAL_TABLET | ORAL | Status: AC
  Administered 2015-03-16: 15 mg via ORAL
  Filled 2015-03-16: qty 3

## 2015-03-16 MED ORDER — ALPRAZOLAM 0.5 MG PO TABS
0.5000 mg | ORAL_TABLET | Freq: Every evening | ORAL | Status: DC | PRN
  Administered 2015-03-17: 0.5 mg via ORAL
  Filled 2015-03-16 (×2): qty 1

## 2015-03-16 MED ORDER — SODIUM CHLORIDE 0.9 % IR SOLN
Status: DC | PRN
  Administered 2015-03-16: 3000 mL

## 2015-03-16 MED ORDER — MORPHINE SULFATE (PF) 2 MG/ML IV SOLN
INTRAVENOUS | Status: AC
  Filled 2015-03-16: qty 1

## 2015-03-16 MED ORDER — METOCLOPRAMIDE HCL 5 MG/ML IJ SOLN: 5.0000 mg | Freq: Three times a day (TID) | INTRAMUSCULAR | Status: DC | PRN

## 2015-03-16 MED ORDER — HYDROMORPHONE HCL 1 MG/ML IJ SOLN
INTRAMUSCULAR | Status: AC
  Filled 2015-03-16: qty 1

## 2015-03-16 MED ORDER — MENTHOL 3 MG MT LOZG: 1.0000 | LOZENGE | OROMUCOSAL | Status: DC | PRN

## 2015-03-16 MED ORDER — ONDANSETRON HCL 4 MG/2ML IJ SOLN
4.0000 mg | Freq: Four times a day (QID) | INTRAMUSCULAR | Status: DC | PRN
  Administered 2015-03-17: 4 mg via INTRAVENOUS
  Filled 2015-03-16: qty 2

## 2015-03-16 MED ORDER — MIDAZOLAM HCL 5 MG/5ML IJ SOLN
INTRAMUSCULAR | Status: DC | PRN
  Administered 2015-03-16: 2 mg via INTRAVENOUS

## 2015-03-16 MED ORDER — CEFAZOLIN SODIUM 1-5 GM-% IV SOLN
1.0000 g | Freq: Four times a day (QID) | INTRAVENOUS | Status: AC
  Administered 2015-03-16 – 2015-03-17 (×2): 1 g via INTRAVENOUS
  Filled 2015-03-16 (×2): qty 50

## 2015-03-16 MED ORDER — CEFAZOLIN SODIUM-DEXTROSE 2-3 GM-% IV SOLR
2.0000 g | INTRAVENOUS | Status: AC
  Administered 2015-03-16: 2 g via INTRAVENOUS
  Filled 2015-03-16: qty 50

## 2015-03-16 MED ORDER — PROMETHAZINE HCL 25 MG/ML IJ SOLN
6.2500 mg | INTRAMUSCULAR | Status: DC | PRN
  Administered 2015-03-16: 6.25 mg via INTRAVENOUS

## 2015-03-16 MED ORDER — ALUM & MAG HYDROXIDE-SIMETH 200-200-20 MG/5ML PO SUSP
30.0000 mL | ORAL | Status: DC | PRN
  Administered 2015-03-17: 30 mL via ORAL
  Filled 2015-03-16: qty 30

## 2015-03-16 MED ORDER — ACETAMINOPHEN 325 MG PO TABS
650.0000 mg | ORAL_TABLET | Freq: Four times a day (QID) | ORAL | Status: DC | PRN
  Administered 2015-03-16: 650 mg via ORAL

## 2015-03-16 MED ORDER — PROPOFOL 500 MG/50ML IV EMUL
INTRAVENOUS | Status: DC | PRN
  Administered 2015-03-16: 75 ug/kg/min via INTRAVENOUS

## 2015-03-16 MED ORDER — LACTATED RINGERS IV SOLN
INTRAVENOUS | Status: DC
  Administered 2015-03-16 (×3): via INTRAVENOUS

## 2015-03-16 MED ORDER — MORPHINE SULFATE (PF) 2 MG/ML IV SOLN
INTRAVENOUS | Status: AC
  Administered 2015-03-16: 2 mg via INTRAVENOUS
  Filled 2015-03-16: qty 1

## 2015-03-16 SURGICAL SUPPLY — 51 items
APL SKNCLS STERI-STRIP NONHPOA (GAUZE/BANDAGES/DRESSINGS) ×1
BENZOIN TINCTURE PRP APPL 2/3 (GAUZE/BANDAGES/DRESSINGS) ×2 IMPLANT
BLADE SAW SGTL 18X1.27X75 (BLADE) ×2 IMPLANT
BLADE SURG ROTATE 9660 (MISCELLANEOUS) IMPLANT
CAPT HIP TOTAL 2 ×1 IMPLANT
CELLS DAT CNTRL 66122 CELL SVR (MISCELLANEOUS) ×1 IMPLANT
COVER SURGICAL LIGHT HANDLE (MISCELLANEOUS) ×2 IMPLANT
DRAPE C-ARM 42X72 X-RAY (DRAPES) ×2 IMPLANT
DRAPE STERI IOBAN 125X83 (DRAPES) ×2 IMPLANT
DRAPE U-SHAPE 47X51 STRL (DRAPES) ×6 IMPLANT
DRSG AQUACEL AG ADV 3.5X10 (GAUZE/BANDAGES/DRESSINGS) ×2 IMPLANT
DURAPREP 26ML APPLICATOR (WOUND CARE) ×2 IMPLANT
ELECT BLADE 4.0 EZ CLEAN MEGAD (MISCELLANEOUS) ×2
ELECT BLADE 6.5 EXT (BLADE) IMPLANT
ELECT REM PT RETURN 9FT ADLT (ELECTROSURGICAL) ×2
ELECTRODE BLDE 4.0 EZ CLN MEGD (MISCELLANEOUS) ×1 IMPLANT
ELECTRODE REM PT RTRN 9FT ADLT (ELECTROSURGICAL) ×1 IMPLANT
FACESHIELD WRAPAROUND (MASK) ×4 IMPLANT
FACESHIELD WRAPAROUND OR TEAM (MASK) ×2 IMPLANT
GLOVE BIOGEL PI IND STRL 8 (GLOVE) ×2 IMPLANT
GLOVE BIOGEL PI INDICATOR 8 (GLOVE) ×2
GLOVE ECLIPSE 8.0 STRL XLNG CF (GLOVE) ×2 IMPLANT
GLOVE ORTHO TXT STRL SZ7.5 (GLOVE) ×4 IMPLANT
GOWN STRL REUS W/ TWL LRG LVL3 (GOWN DISPOSABLE) ×2 IMPLANT
GOWN STRL REUS W/ TWL XL LVL3 (GOWN DISPOSABLE) ×2 IMPLANT
GOWN STRL REUS W/TWL LRG LVL3 (GOWN DISPOSABLE) ×4
GOWN STRL REUS W/TWL XL LVL3 (GOWN DISPOSABLE) ×4
HANDPIECE INTERPULSE COAX TIP (DISPOSABLE) ×2
KIT BASIN OR (CUSTOM PROCEDURE TRAY) ×2 IMPLANT
KIT ROOM TURNOVER OR (KITS) ×2 IMPLANT
MANIFOLD NEPTUNE II (INSTRUMENTS) ×2 IMPLANT
NS IRRIG 1000ML POUR BTL (IV SOLUTION) ×2 IMPLANT
PACK TOTAL JOINT (CUSTOM PROCEDURE TRAY) ×2 IMPLANT
PAD ARMBOARD 7.5X6 YLW CONV (MISCELLANEOUS) ×2 IMPLANT
RETRACTOR WND ALEXIS 18 MED (MISCELLANEOUS) ×1 IMPLANT
RTRCTR WOUND ALEXIS 18CM MED (MISCELLANEOUS) ×2
SET HNDPC FAN SPRY TIP SCT (DISPOSABLE) ×1 IMPLANT
STAPLER VISISTAT 35W (STAPLE) IMPLANT
STRIP CLOSURE SKIN 1/2X4 (GAUZE/BANDAGES/DRESSINGS) ×4 IMPLANT
SUT ETHIBOND NAB CT1 #1 30IN (SUTURE) ×2 IMPLANT
SUT MNCRL AB 4-0 PS2 18 (SUTURE) IMPLANT
SUT VIC AB 0 CT1 27 (SUTURE) ×2
SUT VIC AB 0 CT1 27XBRD ANBCTR (SUTURE) ×1 IMPLANT
SUT VIC AB 1 CT1 27 (SUTURE) ×2
SUT VIC AB 1 CT1 27XBRD ANBCTR (SUTURE) ×1 IMPLANT
SUT VIC AB 2-0 CT1 27 (SUTURE) ×2
SUT VIC AB 2-0 CT1 TAPERPNT 27 (SUTURE) ×1 IMPLANT
TOWEL OR 17X24 6PK STRL BLUE (TOWEL DISPOSABLE) ×2 IMPLANT
TOWEL OR 17X26 10 PK STRL BLUE (TOWEL DISPOSABLE) ×2 IMPLANT
TRAY FOLEY CATH 16FRSI W/METER (SET/KITS/TRAYS/PACK) IMPLANT
WATER STERILE IRR 1000ML POUR (IV SOLUTION) ×4 IMPLANT

## 2015-03-16 NOTE — H&P (Signed)
TOTAL HIP ADMISSION H&P  Patient is admitted for left total hip arthroplasty.  Subjective:  Chief Complaint: left hip pain  HPI: Sarah Blackwell, 60 y.o. female, has a history of pain and functional disability in the left hip(s) due to arthritis and patient has failed non-surgical conservative treatments for greater than 12 weeks to include NSAID's and/or analgesics, corticosteriod injections, flexibility and strengthening excercises, supervised PT with diminished ADL's post treatment and activity modification.  Onset of symptoms was gradual starting 2 years ago with rapidlly worsening course since that time.The patient noted no past surgery on the left hip(s).  Patient currently rates pain in the left hip at 9 out of 10 with activity. Patient has night pain, worsening of pain with activity and weight bearing, pain that interfers with activities of daily living and pain with passive range of motion. Patient has evidence of subchondral sclerosis, periarticular osteophytes and joint space narrowing by imaging studies. This condition presents safety issues increasing the risk of falls.  There is no current active infection.  Patient Active Problem List   Diagnosis Date Noted  . Osteoarthritis of left hip 03/16/2015  . Status post total replacement of right hip 06/30/2014  . Lumbar back pain with radiculopathy affecting right lower extremity 04/09/2014  . DDD (degenerative disc disease), lumbar 04/09/2014  . Chronic right hip pain 01/28/2014  . Osteoarthritis of right hip 01/28/2014   Past Medical History  Diagnosis Date  . Hyperlipidemia   . Allergic rhinitis   . PCOS (polycystic ovarian syndrome)   . Perimenopausal   . Anxiety   . Fluid retention   . Hypertension     never has taken medications  . Hypothyroidism   . Headache     migraines when she was younger  . Arthritis   . Cancer ([place])     skin cancer  . Rosacea   . Complication of anesthesia     difficulty waking up after  surgery as a child.   . Pre-diabetes   . PONV (postoperative nausea and vomiting)   . Wears glasses     Past Surgical History  Procedure Laterality Date  . Tonsillectomy    . Fertility surgery    . Mohs surgery      x 2  . Colonoscopy    . Total hip arthroplasty Right 06/30/2014    Procedure: RIGHT TOTAL HIP ARTHROPLASTY ANTERIOR APPROACH;  Surgeon: Mcarthur Rossetti, MD;  Location: [place] [facility];  Service: Orthopedics;  Laterality: Right;    Prescriptions prior to admission  Medication Sig Dispense Refill Last Dose  . ALPRAZolam (XANAX) 0.5 MG tablet Take 0.5 mg by mouth at bedtime as needed for sleep.   06/30/2014 at 0200  . estradiol (VIVELLE-DOT) 0.0375 MG/24HR Place 1 patch onto the skin 2 (two) times a week. Nancy Fetter and Wed  12   . Eszopiclone (ESZOPICLONE) 3 MG TABS Take 3 mg by mouth at bedtime as needed (sleep).    Past Month at Unknown time  . fluticasone (FLONASE) 50 MCG/ACT nasal spray Place 1-2 sprays into both nostrils daily as needed for allergies.   Past Week at Unknown time  . levothyroxine (SYNTHROID, LEVOTHROID) 75 MCG tablet Take 75 mcg by mouth daily before breakfast.   06/30/2014 at 0900  . liothyronine (CYTOMEL) 5 MCG tablet Take 5 mcg by mouth daily.   06/30/2014 at 0900  . naproxen (NAPROSYN) 500 MG tablet Take 500 mg by mouth 2 (two) times daily with a meal.     .  Vitamin D, Ergocalciferol, (DRISDOL) 50000 units CAPS capsule Take 50,000 Units by mouth every 7 (seven) days. Sat or Sun      Allergies  Allergen Reactions  . Sulfa Antibiotics Other (See Comments)    unspecified    Social History  Substance Use Topics  . Smoking status: Never Smoker   . Smokeless tobacco: Never Used  . Alcohol Use: 8.4 oz/week    14 Glasses of wine per week     Comment: 1-2 glasses of wine daily    Family History  Problem Relation Age of Onset  . Cancer Other   . Hypertension Other   . Hyperlipidemia Other   . Stroke Other   . Anemia Mother   . Hyperlipidemia Mother   .  Hypertension Mother   . Stroke Father   . Hyperlipidemia Father   . Cancer Father   . CVA Father   . Heart attack Maternal Grandfather   . Cancer Maternal Grandfather   . Cancer Paternal Grandfather   . Alcohol abuse Paternal Grandfather      Review of Systems  Musculoskeletal: Positive for joint pain.  All other systems reviewed and are negative.   Objective:  Physical Exam  Constitutional: She is oriented to person, place, and time. She appears well-developed and well-nourished.  HENT:  Head: Normocephalic and atraumatic.  Eyes: EOM are normal. Pupils are equal, round, and reactive to light.  Neck: Normal range of motion. Neck supple.  Cardiovascular: Normal rate and regular rhythm.   Respiratory: Effort normal and breath sounds normal.  GI: Soft. Bowel sounds are normal.  Musculoskeletal:       Left hip: She exhibits decreased range of motion, decreased strength, tenderness and bony tenderness.  Neurological: She is alert and oriented to person, place, and time.  Skin: Skin is warm and dry.  Psychiatric: She has a normal mood and affect.    Vital signs in last 24 hours:    Labs:   Estimated body mass index is 29.79 kg/(m^2) as calculated from the following:   Height as of 06/30/14: 5\' 8"  (1.727 m).   Weight as of 06/17/14: 88.86 kg (195 lb 14.4 oz).   Imaging Review Plain radiographs demonstrate moderate degenerative joint disease of the left hip(s). The bone quality appears to be excellent for age and reported activity level.  Assessment/Plan:  End stage arthritis, left hip(s)  The patient history, physical examination, clinical judgement of the provider and imaging studies are consistent with end stage degenerative joint disease of the left hip(s) and total hip arthroplasty is deemed medically necessary. The treatment options including medical management, injection therapy, arthroscopy and arthroplasty were discussed at length. The risks and benefits of total hip  arthroplasty were presented and reviewed. The risks due to aseptic loosening, infection, stiffness, dislocation/subluxation,  thromboembolic complications and other imponderables were discussed.  The patient acknowledged the explanation, agreed to proceed with the plan and consent was signed. Patient is being admitted for inpatient treatment for surgery, pain control, PT, OT, prophylactic antibiotics, VTE prophylaxis, progressive ambulation and ADL's and discharge planning.The patient is planning to be discharged home with home health services

## 2015-03-16 NOTE — Anesthesia Preprocedure Evaluation (Addendum)
Anesthesia Evaluation  Patient identified by MRN, date of birth, ID band Patient awake    Reviewed: Allergy & Precautions, NPO status , Patient's Chart, lab work & pertinent test results  Airway Mallampati: II  TM Distance: >3 FB Neck ROM: Full    Dental  (+) Teeth Intact, Dental Advisory Given   Pulmonary neg pulmonary ROS,    breath sounds clear to auscultation       Cardiovascular hypertension,  Rhythm:Regular Rate:Normal     Neuro/Psych Anxiety  Neuromuscular disease (Lumbar radiculopathy)    GI/Hepatic negative GI ROS, Neg liver ROS,   Endo/Other  Hypothyroidism   Renal/GU negative Renal ROS     Musculoskeletal  (+) Arthritis ,   Abdominal   Peds  Hematology negative hematology ROS (+)   Anesthesia Other Findings   Reproductive/Obstetrics                           Lab Results  Component Value Date   WBC 4.0 03/05/2015   HGB 13.1 03/05/2015   HCT 40.1 03/05/2015   MCV 91.8 03/05/2015   PLT 206 03/05/2015   Lab Results  Component Value Date   CREATININE 0.75 03/05/2015   BUN 18 03/05/2015   NA 142 03/05/2015   K 3.9 03/05/2015   CL 106 03/05/2015   CO2 26 03/05/2015   No results found for: INR, PROTIME  Anesthesia Physical  Anesthesia Plan  ASA: II  Anesthesia Plan: MAC and Spinal   Post-op Pain Management:    Induction: Intravenous  Airway Management Planned: Natural Airway and Simple Face Mask  Additional Equipment:   Intra-op Plan:   Post-operative Plan:   Informed Consent: I have reviewed the patients History and Physical, chart, labs and discussed the procedure including the risks, benefits and alternatives for the proposed anesthesia with the patient or authorized representative who has indicated his/her understanding and acceptance.   Dental advisory given  Plan Discussed with: CRNA and Anesthesiologist  Anesthesia Plan Comments:          Anesthesia Quick Evaluation

## 2015-03-16 NOTE — Progress Notes (Signed)
Pt waiting for room. Husband @ bedside. Report given to [facility]

## 2015-03-16 NOTE — Anesthesia Postprocedure Evaluation (Signed)
Anesthesia Post Note  Patient: Ragna Volkov  Procedure(s) Performed: Procedure(s) (LRB): LEFT TOTAL HIP ARTHROPLASTY ANTERIOR APPROACH (Left)  Patient location during evaluation: PACU Anesthesia Type: General Level of consciousness: awake and alert and patient cooperative Pain management: pain level controlled Vital Signs Assessment: post-procedure vital signs reviewed and stable Respiratory status: spontaneous breathing and respiratory function stable Cardiovascular status: stable Anesthetic complications: no    Last Vitals:  Filed Vitals:   03/16/15 1255 03/16/15 1750  BP: 121/81 102/73  Pulse: 65 59  Temp: 36.8 C   Resp: 18 12    Last Pain:  Filed Vitals:   03/16/15 1935  PainSc: [place]

## 2015-03-16 NOTE — Transfer of Care (Signed)
Immediate Anesthesia Transfer of Care Note  Patient: Sarah Blackwell  Procedure(s) Performed: Procedure(s): LEFT TOTAL HIP ARTHROPLASTY ANTERIOR APPROACH (Left)  Patient Location: PACU  Anesthesia Type:MAC and Spinal  Level of Consciousness: awake, alert  and oriented  Airway & Oxygen Therapy: Patient Spontanous Breathing and Patient connected to face mask oxygen  Post-op Assessment: Report given to RN  Post vital signs: Reviewed and stable  Last Vitals:  Filed Vitals:   03/16/15 1255  BP: 121/81  Pulse: 65  Temp: 36.8 C  Resp: 18    Complications: No apparent anesthesia complications

## 2015-03-16 NOTE — Anesthesia Procedure Notes (Signed)
Procedure Name: MAC Date/Time: 03/16/2015 3:18 PM Performed by: Jenne Campus Pre-anesthesia Checklist: Patient identified, Emergency Drugs available, Suction available, Patient being monitored and Timeout performed Patient Re-evaluated:Patient Re-evaluated prior to inductionOxygen Delivery Method: Simple face mask

## 2015-03-16 NOTE — Op Note (Signed)
NAMESHERLY, REVELO             ACCOUNT NO.:  [id number]  MEDICAL RECORD NO.:  ID:[id number]  LOCATION:  [facility]                         FACILITY:  [facility]  PHYSICIAN:  Lind Guest. Ninfa Linden, M.D.DATE OF BIRTH:  1955-09-12  DATE OF PROCEDURE:  03/16/2015 DATE OF DISCHARGE:                              OPERATIVE REPORT   PREOPERATIVE DIAGNOSIS:  Primary arthritis and degenerative joint disease, left hip.  POSTOPERATIVE DIAGNOSIS:  Primary arthritis and degenerative joint disease, left hip.  PROCEDURE:  Left total hip arthroplasty through direct anterior approach.  IMPLANTS:  DePuy Sector Gription acetabular component size 52, size 36+ 0 polyethylene liner, size 12 Corail femoral component with standard offset, size 36 -2 metal hip ball.  SURGEON:  Lind Guest. Ninfa Linden, MD  ASSISTANT:  Erskine Emery, PA-C  ANESTHESIA:  Spinal.  ANTIBIOTICS:  2 g IV Ancef.  BLOOD LOSS:  400 mL.  COMPLICATIONS:  None.  INDICATIONS:  Ms. Sarah Blackwell is a 60 year old female well known to me. She has had debilitating arthritis involving both of her hips.  In 2016, she underwent a successful right total hip arthroplasty through direct anterior approach.  Her left hip has been bothering her quite significantly.  X-rays were not impressive in terms of her arthritis and her pain showed pain on internal-external rotation with activities of daily living.  I did get an MRI of her left hip that showed denuding of the cartilage, significant cartilage loss, and large effusion.  Due to significant pain, she did wish to proceed with a total hip arthroplasty on her left side.  She presents for this now.  She understands the risk of acute blood loss anemia, nerve and vessel injury, fracture, infection, dislocation, DVT.  She understands our goals are decreased pain, improved mobility, and overall improved quality of life.  PROCEDURE DESCRIPTION:  After informed consent was obtained, appropriate left hip  was marked.  She was brought to the operating room and spinal anesthesia was obtained while she was on her stretcher.  A Foley catheter was placed and both feet had traction boots applied to them. Next, she was placed supine on the Hana fracture table.  The perineal post in place.  Both legs in inline skeletal traction devices, but no traction applied.  Her left operative hip was prepped and draped with DuraPrep and sterile drapes.  A time-out was called and she was identified as correct patient, correct left hip.  I then made incision inferior and posterior to the anterior superior iliac spine and carried this obliquely down the leg.  We dissected down the tensor fascia lata muscle and tensor fascia was then divided longitudinally, so we could proceed with a direct anterior approach to the hip.  We identified and cauterized the lateral femoral circumflex vessels and then identified the hip capsule.  We opened the hip capsule in an L-type format finding very large joint effusion.  We placed Cobra retractors within the hip capsule and then exposed our femoral head and neck.  We made our femoral neck cut proximal to lesser trochanter using an oscillating saw and completed this on osteotome.  We placed a corkscrew guide in the femoral head and removed the femoral head in its entirety  and had found to be significantly devoid of cartilage.  We then cleaned the acetabular remnants of the acetabular labrum and other debris.  We placed a bent Hohmann over the medial acetabular rim and then began reaming from size 42 in 2 mm increments up to a size 52 with all reamers under direct visualization, the last reamer under direct fluoroscopy to obtain our depth of reaming, our inclination, and anteversion.  Once this was done, we placed the real DePuy Sector Gription acetabular component size 52, without difficulty and a 36+ 0 liner for the acetabulum.  Attention was then turned to the femur.  With the  leg externally rotated to 100 degrees extended and abducted, we were able to place a Mueller retractor medially and Hohmann retractor behind the greater trochanter.  We released the lateral joint capsule and used a box cutting osteotome to enter femoral canal and a rongeur to lateralize.  I then began broaching from a size 8 broach using the Corail broaching system up to a size 12. This 12 was tight, so we used a calcar planer off this, trialed a standard neck and a 36- 2 hip ball like the other side and reducing the pelvis.  We were pleased with leg length, stability, and offset, this corresponded with preoperative films as well.  We then dislocated the hip and removed the trial components.  We placed the real Corail femoral component size 12 and the real 36- 2 metal hip ball reduced this in the acetabulum and again it was stable.  We then irrigated the soft tissue with normal saline solution using pulsatile lavage.  We were able to close the joint capsule with interrupted #1 Ethibond suture followed by running #1 Vicryl in the tensor fascia, 0 Vicryl in the deep tissue, 2-0 Vicryl in subcutaneous tissue, 4-0 Monocryl subcuticular stitch, and Steri-Strips on the skin.  Aquacel dressing was applied.  She was then taken off the Hana table, taken to the recovery room in stable condition.  All final counts were correct.  No other complications noted.  Of note, Erskine Emery, PA-C assisted in the entire case.  His assistance was crucial for facilitating all aspects of this case.     Lind Guest. Ninfa Linden, M.D.     CYB/MEDQ  D:  03/16/2015  T:  03/16/2015  Job:  [id number]

## 2015-03-16 NOTE — Brief Op Note (Signed)
03/16/2015  5:28 PM  PATIENT:  Sarah Blackwell  60 y.o. female  PRE-OPERATIVE DIAGNOSIS:  left hip osteoarthritis  POST-OPERATIVE DIAGNOSIS:  left hip osteoarthritis  PROCEDURE:  Procedure(s): LEFT TOTAL HIP ARTHROPLASTY ANTERIOR APPROACH (Left)  SURGEON:  Surgeon(s) and Role:    * Mcarthur Rossetti, MD - Primary  PHYSICIAN ASSISTANT: Benita Stabile, PA-C  ANESTHESIA:   spinal  EBL:  Total I/O In: 1000 [I.V.:1000] Out: 650 [Urine:250; Blood:400]  COUNTS:  YES  TOURNIQUET:  * No tourniquets in log *  DICTATION: .Other Dictation: Dictation Number [id number]  PLAN OF CARE: Admit to inpatient   PATIENT DISPOSITION:  PACU - hemodynamically stable.   Delay start of Pharmacological VTE agent (>24hrs) due to surgical blood loss or risk of bleeding: no

## 2015-03-17 ENCOUNTER — Encounter (HOSPITAL_COMMUNITY): Payer: Self-pay | Admitting: Orthopaedic Surgery

## 2015-03-17 LAB — CBC
HEMATOCRIT: 31.2 % — AB (ref 36.0–46.0)
Hemoglobin: 10.4 g/dL — ABNORMAL LOW (ref 12.0–15.0)
MCH: 31 pg (ref 26.0–34.0)
MCHC: 33.3 g/dL (ref 30.0–36.0)
MCV: 93.1 fL (ref 78.0–100.0)
Platelets: 156 10*3/uL (ref 150–400)
RBC: 3.35 MIL/uL — ABNORMAL LOW (ref 3.87–5.11)
RDW: 12.6 % (ref 11.5–15.5)
WBC: 5.2 10*3/uL (ref 4.0–10.5)

## 2015-03-17 LAB — BASIC METABOLIC PANEL
Anion gap: 6 (ref 5–15)
BUN: 9 mg/dL (ref 6–20)
CALCIUM: 8.7 mg/dL — AB (ref 8.9–10.3)
CO2: 28 mmol/L (ref 22–32)
CREATININE: 0.68 mg/dL (ref 0.44–1.00)
Chloride: 106 mmol/L (ref 101–111)
GFR calc non Af Amer: >60 mL/min (ref >60)
Glucose, Bld: 117 mg/dL — ABNORMAL HIGH (ref 65–99)
Potassium: 4.8 mmol/L (ref 3.5–5.1)
SODIUM: 140 mmol/L (ref 135–145)

## 2015-03-17 MED ORDER — KETOROLAC TROMETHAMINE 15 MG/ML IJ SOLN
7.5000 mg | Freq: Four times a day (QID) | INTRAMUSCULAR | Status: DC
  Administered 2015-03-17 – 2015-03-18 (×5): 7.5 mg via INTRAVENOUS
  Filled 2015-03-17 (×5): qty 1

## 2015-03-17 MED ORDER — OXYCODONE HCL ER 10 MG PO T12A
20.0000 mg | EXTENDED_RELEASE_TABLET | Freq: Two times a day (BID) | ORAL | Status: DC
  Administered 2015-03-17 – 2015-03-18 (×3): 20 mg via ORAL
  Filled 2015-03-17 (×3): qty 2

## 2015-03-17 NOTE — Progress Notes (Signed)
Physical Therapy Treatment Patient Details Name: Sarah Blackwell MRN: [id number] DOB: 25-Jan-1955 Today's Date: 03/17/2015    History of Present Illness 60 y.o. female now s/p Lt anterior THA through direct approach. PMH: anxiety, hypertension, pre-diabetes, Rt THA.    PT Comments    Patient progressing gradually with mobility. Patient able to increase her ambulation to 80 feet with rw. Patient having nausea with emesis during session, nursing notified and medicated accordingly. Patient has remained lethargic during both PT sessions. Continue to anticipate D/C to home with family support. Will need to attempt stairs prior to D/C and progress ambulation distance. PT to continue to follow.   Follow Up Recommendations  Home health PT;Supervision for mobility/OOB     Equipment Recommendations  None recommended by PT    Recommendations for Other Services       Precautions / Restrictions Precautions Precautions: None Restrictions Weight Bearing Restrictions: Yes LLE Weight Bearing: Weight bearing as tolerated    Mobility  Bed Mobility Overal bed mobility: Needs Assistance Bed Mobility: Supine to Sit     Supine to sit: Mod assist     General bed mobility comments: up in chair upon arrival and pt requesting return to chair  Transfers Overall transfer level: Needs assistance Equipment used: Rolling walker (2 wheeled) Transfers: Sit to/from Stand Sit to Stand: Min guard         General transfer comment: guard for safety  Ambulation/Gait Ambulation/Gait assistance: Min guard Ambulation Distance (Feet): 80 Feet Assistive device: Rolling walker (2 wheeled) Gait Pattern/deviations: Step-to pattern;Step-through pattern Gait velocity: decreased   General Gait Details: progressing from step-to patter to step through when cues provided.    Stairs            Wheelchair Mobility    Modified Rankin (Stroke Patients Only)       Balance Overall balance assessment:  Needs assistance Sitting-balance support: No upper extremity supported Sitting balance-Leahy Scale: Good     Standing balance support: Bilateral upper extremity supported Standing balance-Leahy Scale: Poor Standing balance comment: using rw for support                    Cognition Arousal/Alertness: Lethargic;Suspect due to medications Behavior During Therapy: [facility] for tasks assessed/performed Overall Cognitive Status: Within Functional Limits for tasks assessed                      Exercises Total Joint Exercises Ankle Circles/Pumps: Both;15 reps Quad Sets: Left;Strengthening;10 reps Gluteal Sets: Both;10 reps Heel Slides: AAROM;Left;10 reps Hip ABduction/ADduction: Strengthening;Left;10 reps;Other (comment) (mod assist)    General Comments        Pertinent Vitals/Pain Pain Assessment: 0-10 Pain Score: 6  Pain Location: Lt hip Pain Descriptors / Indicators: Aching Pain Intervention(s): Limited activity within patient's tolerance;Monitored during session    Home Living Family/patient expects to be discharged to:: Private residence Living Arrangements: Spouse/significant other Available Help at Discharge: Family;Available 24 hours/day Type of Home: House Home Access: Stairs to enter Entrance Stairs-Rails: None Home Layout: Two level;Able to live on main level with bedroom/bathroom Home Equipment: Gilford Rile - 2 wheels;Cane - single point;Bedside commode      Prior Function Level of Independence: Independent          PT Goals (current goals can now be found in the care plan section) Acute Rehab PT Goals Patient Stated Goal: go home from the hospital PT Goal Formulation: With patient Time For Goal Achievement: 03/31/15 Potential to Achieve Goals: Good Progress towards  PT goals: Progressing toward goals    Frequency  7X/week    PT Plan Current plan remains appropriate    Co-evaluation             End of Session Equipment Utilized During  Treatment: Gait belt Activity Tolerance: Patient tolerated treatment well;Patient limited by lethargy Patient left: in chair;with call bell/phone within reach;with family/visitor present     Time: 1500-1526 PT Time Calculation (min) (ACUTE ONLY): 26 min  Charges:  $Gait Training: 8-22 mins $Therapeutic Exercise: 8-22 mins                    G Codes:      Cassell Clement, PT, CSCS Pager [phone] Office [phone]  03/17/2015, 3:34 PM

## 2015-03-17 NOTE — Evaluation (Signed)
Physical Therapy Evaluation Patient Details Name: Sarah Blackwell MRN: [id number] DOB: Aug 17, 1955 Today's Date: 03/17/2015   History of Present Illness  60 y.o. female now s/p Lt anterior THA through direct approach. PMH: anxiety, hypertension, pre-diabetes, Rt THA.  Clinical Impression  Pt is s/p anterior THA (direct approach) resulting in the deficits listed below (see PT Problem List).  Pt able to ambulate 25 feet with rw during initial session. Pt will benefit from skilled PT to increase their independence and safety with mobility to allow discharge to home with family support.     Follow Up Recommendations Home health PT;Supervision for mobility/OOB    Equipment Recommendations  None recommended by PT    Recommendations for Other Services       Precautions / Restrictions Precautions Precautions: None Restrictions Weight Bearing Restrictions: Yes LLE Weight Bearing: Weight bearing as tolerated      Mobility  Bed Mobility Overal bed mobility: Needs Assistance Bed Mobility: Supine to Sit     Supine to sit: Mod assist     General bed mobility comments: assist provided with LLE along with cues for sequence  Transfers Overall transfer level: Needs assistance Equipment used: Rolling walker (2 wheeled) Transfers: Sit to/from Stand Sit to Stand: Min assist         General transfer comment: cues for hand positions  Ambulation/Gait Ambulation/Gait assistance: Min guard Ambulation Distance (Feet): 25 Feet Assistive device: Rolling walker (2 wheeled) Gait Pattern/deviations: Step-to pattern;Decreased weight shift to left Gait velocity: decreased   General Gait Details: encouraging weight bearing through LLE.   Stairs            Wheelchair Mobility    Modified Rankin (Stroke Patients Only)       Balance Overall balance assessment: Needs assistance Sitting-balance support: No upper extremity supported Sitting balance-Leahy Scale: Good     Standing  balance support: Bilateral upper extremity supported Standing balance-Leahy Scale: Poor Standing balance comment: using rw                             Pertinent Vitals/Pain Pain Assessment: 0-10 Pain Score: 7  Pain Location: Lt hip Pain Descriptors / Indicators: Burning;Aching;Throbbing Pain Intervention(s): Limited activity within patient's tolerance;Monitored during session    Allen expects to be discharged to:: Private residence Living Arrangements: Spouse/significant other Available Help at Discharge: Family;Available 24 hours/day Type of Home: House Home Access: Stairs to enter Entrance Stairs-Rails: None Entrance Stairs-Number of Steps: 3 Home Layout: Two level;Able to live on main level with bedroom/bathroom Home Equipment: Gilford Rile - 2 wheels;Cane - single point;Bedside commode      Prior Function Level of Independence: Independent               Hand Dominance        Extremity/Trunk Assessment   Upper Extremity Assessment: Defer to OT evaluation           Lower Extremity Assessment: LLE deficits/detail   LLE Deficits / Details: fair quad activation     Communication   Communication: No difficulties  Cognition Arousal/Alertness: Lethargic;Suspect due to medications Behavior During Therapy: [facility] for tasks assessed/performed Overall Cognitive Status: Within Functional Limits for tasks assessed                      General Comments      Exercises Total Joint Exercises Ankle Circles/Pumps: Both;15 reps Quad Sets: Left;Strengthening;10 reps Heel Slides: AAROM;Left;10 reps Hip ABduction/ADduction: Strengthening;Left;10  reps;Other (comment) (mod assist needed. )      Assessment/Plan    PT Assessment Patient needs continued PT services  PT Diagnosis Difficulty walking;Acute pain   PT Problem List Decreased strength;Decreased range of motion;Decreased activity tolerance;Decreased balance;Decreased mobility   PT Treatment Interventions DME instruction;Gait training;Stair training;Functional mobility training;Therapeutic activities;Therapeutic exercise;Patient/family education   PT Goals (Current goals can be found in the Care Plan section) Acute Rehab PT Goals Patient Stated Goal: go home from the hospital PT Goal Formulation: With patient Time For Goal Achievement: 03/31/15 Potential to Achieve Goals: Good    Frequency 7X/week   Barriers to discharge        Co-evaluation               End of Session Equipment Utilized During Treatment: Gait belt Activity Tolerance: Patient tolerated treatment well Patient left: in chair;with call bell/phone within reach;with family/visitor present Nurse Communication: Mobility status;Precautions;Weight bearing status         Time: [id number] PT Time Calculation (min) (ACUTE ONLY): 29 min   Charges:   PT Evaluation $PT Eval Moderate Complexity: 1 Procedure PT Treatments $Gait Training: 8-22 mins   PT G Codes:        Cassell Clement, PT, CSCS Pager [phone] Office [phone]  03/17/2015, 12:04 PM

## 2015-03-17 NOTE — Progress Notes (Signed)
Subjective: 1 Day Post-Op Procedure(s) (LRB): LEFT TOTAL HIP ARTHROPLASTY ANTERIOR APPROACH (Left) Patient reports pain as severe.  Awake and alert.  Objective: Vital signs in last 24 hours: Temp:  [94.3 F (34.6 C)-98.2 F (36.8 C)] 98.1 F (36.7 C) (03/08 0524) Pulse Rate:  [48-72] 72 (03/08 0524) Resp:  [10-18] 16 (03/08 0524) BP: (88-129)/(58-81) 129/69 mmHg (03/08 0524) SpO2:  [97 %-100 %] 99 % (03/08 0524) Weight:  [94.348 kg (208 lb)-104.2 kg (229 lb 11.5 oz)] 104.2 kg (229 lb 11.5 oz) (03/07 2202)  Intake/Output from previous day: 03/07 0701 - 03/08 0700 In: 2730 [P.O.:480; I.V.:1800] Out: 1450 [Urine:1050; Blood:400] Intake/Output this shift:    No results for input(s): HGB in the last 72 hours. No results for input(s): WBC, RBC, HCT, PLT in the last 72 hours.  Recent Labs  03/17/15 0510  NA 140  K 4.8  CL 106  CO2 28  BUN 9  CREATININE 0.68  GLUCOSE 117*  CALCIUM 8.7*   No results for input(s): LABPT, INR in the last 72 hours.  Sensation intact distally Intact pulses distally Dorsiflexion/Plantar flexion intact Incision: dressing C/D/I No cellulitis present Compartment soft  Assessment/Plan: 1 Day Post-Op Procedure(s) (LRB): LEFT TOTAL HIP ARTHROPLASTY ANTERIOR APPROACH (Left) Up with therapy  Carine Nordgren Y 03/17/2015, 7:18 AM

## 2015-03-17 NOTE — Care Management Note (Addendum)
Case Management Note  Patient Details  Name: Girtie Behnken MRN: [id number] Date of Birth: [birth year]  Subjective/Objective:                    Action/Plan:  Patient has had [facility] in the past .  She is requesting [facility] PT until her regular PT Pollyann Savoy is back from leave on March 29, 2015. Awaiting call back from [place] to make request.   Patient already has 3 in 1 and walker at home.  Initial UR completed.  Will await PT/OT evals Expected Discharge Date:                  Expected Discharge Plan:  [place]  In-House Referral:     Discharge planning Services     Post Acute Care Choice:    Choice offered to:     DME Arranged:    DME Agency:     HH Arranged:   HHPT HH Agency:   [facility]  Status of Service:  In process, will continue to follow  Medicare Important Message Given:    Date Medicare IM Given:    Medicare IM give by:    Date Additional Medicare IM Given:    Additional Medicare Important Message give by:     If discussed at [place] of Stay Meetings, dates discussed:    Additional Comments:  Marilu Favre, RN 03/17/2015, 7:46 AM

## 2015-03-17 NOTE — Progress Notes (Signed)
OT Cancellation Note  Patient Details Name: Sarah Blackwell MRN: [id number] DOB: Sep 05, 1955   Cancelled Treatment:    Reason Eval/Treat Not Completed: Fatigue/lethargy limiting ability to participate;Pain limiting ability to participate  Pt had been to bathroom which she said was hard. Pt was back to bed and had taken pain meds. Pt falling asleep mid sentence. Will check on pt later today or next day. Kari Baars, [place]  Payton Mccallum D 03/17/2015, 11:03 AM

## 2015-03-18 MED ORDER — OXYCODONE HCL 5 MG PO TABS: 5.0000 mg | ORAL_TABLET | ORAL | Status: DC | PRN

## 2015-03-18 MED ORDER — METHOCARBAMOL 500 MG PO TABS: 500.0000 mg | ORAL_TABLET | Freq: Four times a day (QID) | ORAL | Status: DC | PRN

## 2015-03-18 MED ORDER — ASPIRIN 325 MG PO TBEC: 325.0000 mg | DELAYED_RELEASE_TABLET | Freq: Two times a day (BID) | ORAL | Status: DC

## 2015-03-18 NOTE — Progress Notes (Signed)
Physical Therapy Treatment Patient Details Name: Sarah Blackwell MRN: [id number] DOB: 11-13-1955 Today's Date: 03/18/2015    History of Present Illness 60 y.o. female now s/p Lt anterior THA through direct approach. PMH: anxiety, hypertension, pre-diabetes, Rt THA.    PT Comments    Pt is POD #2 and progressing well with mobility, less nausea today.  She reports being ready for d/c this AM and I agree.  She was able to demonstrate correct technique and verbalize/demo correct LE sequencing on stairs simulating home entry.  PT will follow acutely until d/c confirmed.    Follow Up Recommendations  Home health PT;Supervision for mobility/OOB     Equipment Recommendations  None recommended by PT    Recommendations for Other Services   NA     Precautions / Restrictions Precautions Precautions: None Restrictions Weight Bearing Restrictions: Yes LLE Weight Bearing: Weight bearing as tolerated    Mobility  Bed Mobility   Bed Mobility: Supine to Sit     Supine to sit: Supervision     General bed mobility comments: Pt OOB in recliner chair  Transfers Overall transfer level: Needs assistance Equipment used: Rolling walker (2 wheeled) Transfers: Sit to/from Stand Sit to Stand: Supervision Stand pivot transfers: Supervision       General transfer comment: Supervision for safety due to slow transitions and heavy reliance on hands for support during transitions.   Ambulation/Gait Ambulation/Gait assistance: Supervision Ambulation Distance (Feet): 80 Feet Assistive device: Rolling walker (2 wheeled) Gait Pattern/deviations: Step-through pattern;Antalgic Gait velocity: decreased Gait velocity interpretation: Below normal speed for age/gender General Gait Details: Moderately antalgic gait pattern. RW adjusted up to fit height better.  Pt with safe speed and pattern with RW.    Stairs Stairs: Yes Stairs assistance: Min assist Stair Management: One rail Left;Step to  pattern;Forwards Number of Stairs: 2 General stair comments: Practiced two stairs with railing as we did not have only the curb step on this unit.  Practice was more to reinforce LE sequencing than to really simulate home entry where she would go up ~1.5 steps with RW forward.  Pt and husband were both able to report correct LE sequencing for ascending and descending the stairs.          Balance Overall balance assessment: Needs assistance Sitting-balance support: Feet supported;No upper extremity supported Sitting balance-Leahy Scale: Good     Standing balance support: Bilateral upper extremity supported;No upper extremity supported;Single extremity supported Standing balance-Leahy Scale: Fair                      Cognition Arousal/Alertness: Awake/alert Behavior During Therapy: WFL for tasks assessed/performed Overall Cognitive Status: Within Functional Limits for tasks assessed                      Exercises Total Joint Exercises Ankle Circles/Pumps: AROM;Both;20 reps Quad Sets: AROM;Both;10 reps Short Arc Quad: AROM;Left;10 reps Heel Slides: AAROM;Left;10 reps Hip ABduction/ADduction: AAROM;Left;10 reps Long Arc Quad: AROM;Left;10 reps        Pertinent Vitals/Pain Pain Assessment: 0-10 Pain Score: 6  Pain Location: with movement of L hip Pain Descriptors / Indicators: Sore Pain Intervention(s): Limited activity within patient's tolerance;Monitored during session;Repositioned;Ice applied           PT Goals (current goals can now be found in the care plan section) Acute Rehab PT Goals Patient Stated Goal: go home from the hospital Progress towards PT goals: Progressing toward goals    Frequency  7X/week  PT Plan Current plan remains appropriate       End of Session   Activity Tolerance: Patient limited by pain Patient left: in chair;with call bell/phone within reach;with family/visitor present     Time: [id number] PT Time  Calculation (min) (ACUTE ONLY): 20 min  Charges:  $Gait Training: 8-22 mins                      Yamato Kopf B. [place], [place], DPT [phone]   03/18/2015, 11:39 AM

## 2015-03-18 NOTE — Discharge Summary (Signed)
Patient ID: Sarah Blackwell MRN: [id number] DOB/AGE: 58-Jun-1957 58 y.o.  Admit date: 03/16/2015 Discharge date: 03/18/2015  Admission Diagnoses:  Principal Problem:   Osteoarthritis of left hip Active Problems:   Status post total replacement of left hip   Discharge Diagnoses:  Same  Past Medical History  Diagnosis Date  . Hyperlipidemia   . Allergic rhinitis   . PCOS (polycystic ovarian syndrome)   . Perimenopausal   . Anxiety   . Fluid retention   . Hypertension     never has taken medications  . Hypothyroidism   . Headache     migraines when she was younger  . Arthritis   . Cancer ([place])     skin cancer  . Rosacea   . Complication of anesthesia     difficulty waking up after surgery as a child.   . Pre-diabetes   . PONV (postoperative nausea and vomiting)   . Wears glasses     Surgeries: Procedure(s): LEFT TOTAL HIP ARTHROPLASTY ANTERIOR APPROACH on 03/16/2015   Consultants:    Discharged Condition: Improved  Hospital Course: Sarah Blackwell is an 60 y.o. female who was admitted 03/16/2015 for operative treatment ofOsteoarthritis of left hip. Patient has severe unremitting pain that affects sleep, daily activities, and work/hobbies. After pre-op clearance the patient was taken to the operating room on 03/16/2015 and underwent  Procedure(s): LEFT TOTAL HIP ARTHROPLASTY ANTERIOR APPROACH.    Patient was given perioperative antibiotics: Anti-infectives    Start     Dose/Rate Route Frequency Ordered Stop   03/16/15 2200  ceFAZolin (ANCEF) IVPB 1 g/50 mL premix     1 g 100 mL/hr over 30 Minutes Intravenous Every 6 hours 03/16/15 2145 03/17/15 0338   03/16/15 1400  ceFAZolin (ANCEF) IVPB 2 g/50 mL premix     2 g 100 mL/hr over 30 Minutes Intravenous To [facility] 03/16/15 0800 03/16/15 1521       Patient was given sequential compression devices, early ambulation, and chemoprophylaxis to prevent DVT.  Patient benefited maximally from hospital stay and there  were no complications.    Recent vital signs: Patient Vitals for the past 24 hrs:  BP Temp Temp src Pulse Resp SpO2  03/18/15 0727 108/64 mmHg 98.3 F (36.8 C) Oral 73 16 99 %  03/17/15 2205 112/61 mmHg 97.8 F (36.6 C) Oral 68 16 98 %  03/17/15 1348 118/64 mmHg 98.2 F (36.8 C) Oral 70 16 99 %     Recent laboratory studies:  Recent Labs  03/17/15 0510  WBC 5.2  HGB 10.4*  HCT 31.2*  PLT 156  NA 140  K 4.8  CL 106  CO2 28  BUN 9  CREATININE 0.68  GLUCOSE 117*  CALCIUM 8.7*     Discharge Medications:     Medication List    TAKE these medications        ALPRAZolam 0.5 MG tablet  Commonly known as:  XANAX  Take 0.5 mg by mouth at bedtime as needed for sleep.     aspirin 325 MG EC tablet  Take 1 tablet (325 mg total) by mouth 2 (two) times daily after a meal.     estradiol 0.0375 MG/24HR  Commonly known as:  VIVELLE-DOT  Place 1 patch onto the skin 2 (two) times a week. Sun and Wed     eszopiclone 3 MG Tabs  Generic drug:  Eszopiclone  Take 3 mg by mouth at bedtime as needed (sleep).     fluticasone 50 MCG/ACT nasal  spray  Commonly known as:  FLONASE  Place 1-2 sprays into both nostrils daily as needed for allergies.     levothyroxine 75 MCG tablet  Commonly known as:  SYNTHROID, LEVOTHROID  Take 75 mcg by mouth daily before breakfast.     liothyronine 5 MCG tablet  Commonly known as:  CYTOMEL  Take 5 mcg by mouth daily.     methocarbamol 500 MG tablet  Commonly known as:  ROBAXIN  Take 1 tablet (500 mg total) by mouth every 6 (six) hours as needed for muscle spasms.     naproxen 500 MG tablet  Commonly known as:  NAPROSYN  Take 500 mg by mouth 2 (two) times daily with a meal.     oxyCODONE 5 MG immediate release tablet  Commonly known as:  Oxy IR/ROXICODONE  Take 1-3 tablets (5-15 mg total) by mouth every 4 (four) hours as needed for severe pain or breakthrough pain.     Vitamin D (Ergocalciferol) 50000 units Caps capsule  Commonly known as:   DRISDOL  Take 50,000 Units by mouth every 7 (seven) days. Sat or Sun        Diagnostic Studies: Mr Hip Left W Contrast  03/08/2015  CLINICAL DATA:  Left hip pain since October 2016. Altered gait related to right hip replacement. Weakness. EXAM: MRI OF THE LEFT HIP WITH CONTRAST(MR Arthrogram) TECHNIQUE: Multiplanar, multisequence MR imaging of the hip was performed immediately following contrast injection into the hip joint under fluoroscopic guidance. No intravenous contrast was administered. COMPARISON:  06/30/2014 FINDINGS: Metal artifact related to the right hip prosthesis. Multiple degenerative foci of edema are present along the articular surface of the left femoral head and along the adjacent acetabulum, likely degenerative. No findings of avascular necrosis. Edema extends into the left femoral neck towards the intertrochanteric region. No fracture is identified. Moderate to prominent chondral thinning craniocaudad in the left hip joint. Suspected posterior labral tear on images 8 through 10 of series 9 with adjacent posterior 1.7 cm paralabral cystic lesion appeared to contain contrast. This dissects along the posterior margin of the iliofemoral ligament. No regional bursitis.  Urinary bladder an uterus normal. In the right sacral ala there is a 3.8 by 2.1 cm lesion with low T1 and high T2 signal characteristics. This is not appreciably changed from 05/29/2014 The finding is not well appreciated on conventional radiographs. New lower lumbar spondylosis noted. IMPRESSION: 1. Posterior labral tear with small posterior superior paralabral cyst. 2. Moderate to advanced osteoarthritis of the left hip, with subcortical foci of edema and prominent craniocaudad loss of articular cartilage. 3. Retrospectively stable lesion in the right sacral ala, no change from 05/29/2014, with primarily high T2 and low T1 signal characteristics. Although not entirely specific, I favor this being an enchondroma or atypical  hemangioma. There is no evidence of extraosseous expansion or significant change from May 2016. The lesion is not well perceived on conventional radiographs. If the patient has a history of malignancy with predilection for bony spread or if otherwise warranted, this could be further assessed with bony pelvic MRI with and without contrast, or pelvic CT to characterize the lesion for any internal calcifications or characteristic matrix appearance. Electronically Signed   By: Van Clines M.D.   On: 03-08-15 15:44   Dg Fluoro Guided Needle Plc Aspiration/injection Loc  03-08-15  CLINICAL DATA:  Suspected labral tear.  LEFT hip pain. EXAM: LEFT HIP INJECTION FOR MRI FLUOROSCOPY TIME:  9 seconds corresponding to a Dose Area Product  of 22.96 Gy*m2 PROCEDURE: Informed written consent was obtained.  Time-out performed. Overlying skin prepped with Betadine, draped in the usual sterile fashion, and infiltrated locally with 1% Lidocaine. Curved 22 gauge spinal needle advanced to the superolateral margin of the LEFT femoral head. 1 ml of Lidocaine injected easily. A mixture of 0.1 ml MultiHance in 20 ml of dilute Isovue M 200 was then used to opacify the LEFT hip joint No immediate complication. IMPRESSION: Technically successful LEFT hip injection under fluoroscopy for MR arthrogram. Electronically Signed   By: Staci Righter M.D.   On: 03/03/2015 14:19   Dg Hip Operative Unilat W Or W/o Pelvis Left  03/16/2015  CLINICAL DATA:  Postop from left hip replacement. Left hip osteoarthritis. EXAM: OPERATIVE LEFT HIP (WITH PELVIS IF PERFORMED) 2 VIEWS TECHNIQUE: Fluoroscopic spot image(s) were submitted for interpretation post-operatively. COMPARISON:  None. FINDINGS: BIPOLAR LEFT HIP PROSTHESIS SEEN IN APPROPRIATE POSITION. NO EVIDENCE OF FRACTURE OR DISLOCATION. RIGHT HIP PROSTHESIS ALSO NOTED. IMPRESSION: Expected postoperative appearance of of bipolar left hip prosthesis. Electronically Signed   By: Earle Gell  M.D.   On: 03/16/2015 17:39    Disposition: 06-Home-Health Care Svc        Follow-up Information    Follow up with Mcarthur Rossetti, MD In 2 weeks.   Specialty:  Orthopedic Surgery   Contact information:   [place] [address] [phone]        Signed: Mcarthur Rossetti 03/18/2015, 10:58 AM

## 2015-03-18 NOTE — Evaluation (Signed)
  Occupational Therapy Evaluation Patient Details Name: Sarah Blackwell MRN: [id number] DOB: [birth year] Today's Date: 04/11/15    History of Present Illness 60 y.o. female now s/p Lt anterior THA through direct approach. PMH: anxiety, hypertension, pre-diabetes, Rt THA.   Clinical Impression   OT completion complete    Follow Up Recommendations  No OT follow up    Equipment Recommendations  None recommended by OT       Precautions / Restrictions Precautions Precautions: None Restrictions Weight Bearing Restrictions: Yes LLE Weight Bearing: Weight bearing as tolerated      Mobility Bed Mobility   Bed Mobility: Supine to Sit     Supine to sit: Supervision        Transfers Overall transfer level: Needs assistance Equipment used: Rolling walker (2 wheeled) Transfers: Sit to/from [employer] Transfers Sit to Stand: Supervision Stand pivot transfers: Supervision                 ADL Overall ADL's : Needs assistance/impaired                                       General ADL Comments: Pt overall S- min A with ADL activty . OT educated pt on ADL activty s/p THA.  Pt has all needed DME.  Husband will A as needed.               Pertinent Vitals/Pain Pain Score: 7  Pain Location: L hip Pain Descriptors / Indicators: Sore Pain Intervention(s): Repositioned;Ice applied;Patient requesting pain meds-RN notified        Extremity/Trunk Assessment Upper Extremity Assessment Upper Extremity Assessment: Generalized weakness           Communication     Cognition Arousal/Alertness: Awake/alert Behavior During Therapy: WFL for tasks assessed/performed Overall Cognitive Status: Within Functional Limits for tasks assessed                                                                  End of Session    Activity Tolerance: Patient tolerated treatment well Patient left: in chair;with call bell/phone within  reach   Time: [id number] OT Time Calculation (min): 20 min Charges:  OT General Charges $OT Visit: 1 Procedure OT Evaluation $OT Eval Low Complexity: 1 Procedure G-Codes:    Sarah Blackwell D 2015-04-11, 11:27 AM

## 2015-03-18 NOTE — Progress Notes (Signed)
Pt d/c to home. Discharge paperwork, reasons to return to MD/ED, prescriptions, and follow up appts reviewed with pt and husband at bedside. Pt offered no questions. PIV removed without complication.. Pt escorted off of unit via wheelchair.

## 2015-03-18 NOTE — Progress Notes (Signed)
Patient ID: Sarah Blackwell, female   DOB: [birth year], 60 y.o.   MRN: [id number] Doing very well.  Can be discharged to home today.

## 2015-03-18 NOTE — Discharge Instructions (Signed)

## 2015-04-12 DIAGNOSIS — Z79891 Long term (current) use of opiate analgesic: Secondary | ICD-10-CM | POA: Diagnosis not present

## 2015-04-12 DIAGNOSIS — Z96643 Presence of artificial hip joint, bilateral: Secondary | ICD-10-CM | POA: Diagnosis not present

## 2015-04-12 DIAGNOSIS — I10 Essential (primary) hypertension: Secondary | ICD-10-CM | POA: Diagnosis not present

## 2015-04-12 DIAGNOSIS — Z471 Aftercare following joint replacement surgery: Secondary | ICD-10-CM | POA: Diagnosis not present

## 2015-04-12 DIAGNOSIS — M5116 Intervertebral disc disorders with radiculopathy, lumbar region: Secondary | ICD-10-CM | POA: Diagnosis not present

## 2015-04-12 DIAGNOSIS — Z7982 Long term (current) use of aspirin: Secondary | ICD-10-CM | POA: Diagnosis not present

## 2015-04-14 DIAGNOSIS — Z471 Aftercare following joint replacement surgery: Secondary | ICD-10-CM | POA: Diagnosis not present

## 2015-04-14 DIAGNOSIS — M5116 Intervertebral disc disorders with radiculopathy, lumbar region: Secondary | ICD-10-CM | POA: Diagnosis not present

## 2015-04-14 DIAGNOSIS — Z96643 Presence of artificial hip joint, bilateral: Secondary | ICD-10-CM | POA: Diagnosis not present

## 2015-04-14 DIAGNOSIS — I10 Essential (primary) hypertension: Secondary | ICD-10-CM | POA: Diagnosis not present

## 2015-04-14 DIAGNOSIS — Z7982 Long term (current) use of aspirin: Secondary | ICD-10-CM | POA: Diagnosis not present

## 2015-04-14 DIAGNOSIS — Z79891 Long term (current) use of opiate analgesic: Secondary | ICD-10-CM | POA: Diagnosis not present

## 2015-04-27 DIAGNOSIS — Z79891 Long term (current) use of opiate analgesic: Secondary | ICD-10-CM | POA: Diagnosis not present

## 2015-04-27 DIAGNOSIS — M5116 Intervertebral disc disorders with radiculopathy, lumbar region: Secondary | ICD-10-CM | POA: Diagnosis not present

## 2015-04-27 DIAGNOSIS — Z7982 Long term (current) use of aspirin: Secondary | ICD-10-CM | POA: Diagnosis not present

## 2015-04-27 DIAGNOSIS — I10 Essential (primary) hypertension: Secondary | ICD-10-CM | POA: Diagnosis not present

## 2015-04-27 DIAGNOSIS — Z96643 Presence of artificial hip joint, bilateral: Secondary | ICD-10-CM | POA: Diagnosis not present

## 2015-04-27 DIAGNOSIS — Z471 Aftercare following joint replacement surgery: Secondary | ICD-10-CM | POA: Diagnosis not present

## 2015-04-29 DIAGNOSIS — Z471 Aftercare following joint replacement surgery: Secondary | ICD-10-CM | POA: Diagnosis not present

## 2015-04-29 DIAGNOSIS — Z7982 Long term (current) use of aspirin: Secondary | ICD-10-CM | POA: Diagnosis not present

## 2015-04-29 DIAGNOSIS — Z79891 Long term (current) use of opiate analgesic: Secondary | ICD-10-CM | POA: Diagnosis not present

## 2015-04-29 DIAGNOSIS — M5116 Intervertebral disc disorders with radiculopathy, lumbar region: Secondary | ICD-10-CM | POA: Diagnosis not present

## 2015-04-29 DIAGNOSIS — I10 Essential (primary) hypertension: Secondary | ICD-10-CM | POA: Diagnosis not present

## 2015-04-29 DIAGNOSIS — Z96643 Presence of artificial hip joint, bilateral: Secondary | ICD-10-CM | POA: Diagnosis not present

## 2015-06-02 DIAGNOSIS — M25551 Pain in right hip: Secondary | ICD-10-CM | POA: Diagnosis not present

## 2015-06-02 DIAGNOSIS — M1611 Unilateral primary osteoarthritis, right hip: Secondary | ICD-10-CM | POA: Diagnosis not present

## 2015-06-02 DIAGNOSIS — M25552 Pain in left hip: Secondary | ICD-10-CM | POA: Diagnosis not present

## 2015-06-05 DIAGNOSIS — J019 Acute sinusitis, unspecified: Secondary | ICD-10-CM | POA: Diagnosis not present

## 2015-06-05 DIAGNOSIS — H6593 Unspecified nonsuppurative otitis media, bilateral: Secondary | ICD-10-CM | POA: Diagnosis not present

## 2015-06-05 DIAGNOSIS — R05 Cough: Secondary | ICD-10-CM | POA: Diagnosis not present

## 2015-06-12 DIAGNOSIS — J069 Acute upper respiratory infection, unspecified: Secondary | ICD-10-CM | POA: Diagnosis not present

## 2015-07-01 DIAGNOSIS — Z1231 Encounter for screening mammogram for malignant neoplasm of breast: Secondary | ICD-10-CM | POA: Diagnosis not present

## 2015-07-01 DIAGNOSIS — Z853 Personal history of malignant neoplasm of breast: Secondary | ICD-10-CM | POA: Diagnosis not present

## 2015-07-28 IMAGING — US US TRANSVAGINAL NON-OB
1 series · 13 of 25 positions shown · non-contrast
Comparison: None

CLINICAL DATA: Post menopausal bleeding.

EXAM:
TRANSABDOMINAL AND TRANSVAGINAL ULTRASOUND OF PELVIS
TECHNIQUE: Both transabdominal and transvaginal ultrasound examinations of the
pelvis were performed. Transabdominal technique was performed for
global imaging of the pelvis including uterus, ovaries, adnexal
regions, and pelvic cul-de-sac. It was necessary to proceed with
endovaginal exam following the transabdominal exam to visualize the
endometrium and bilateral ovaries..

[Series 1: us transvaginal non-ob · 0.29mm/px · 13 of 43 slices shown]
[im 1/43]
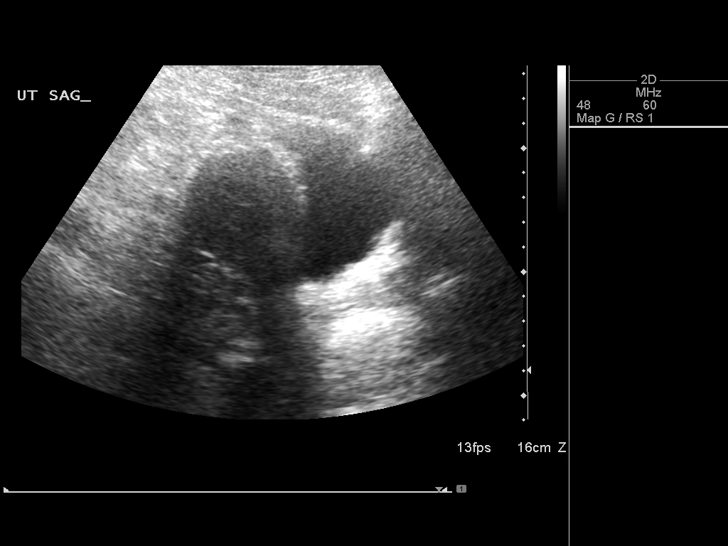
[im 4/43]
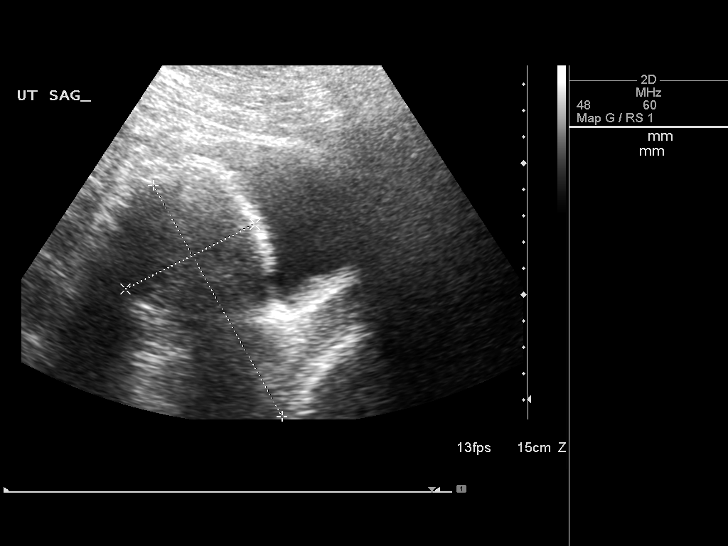
[im 8/43]
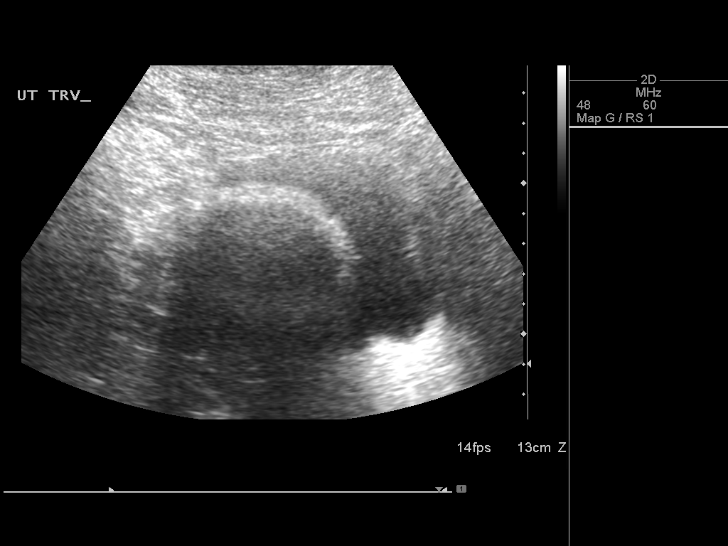
[im 11/43]
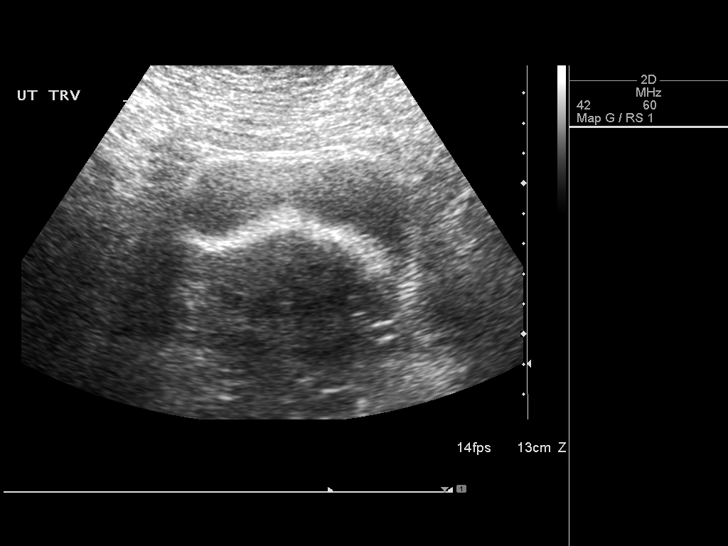
[im 15/43]
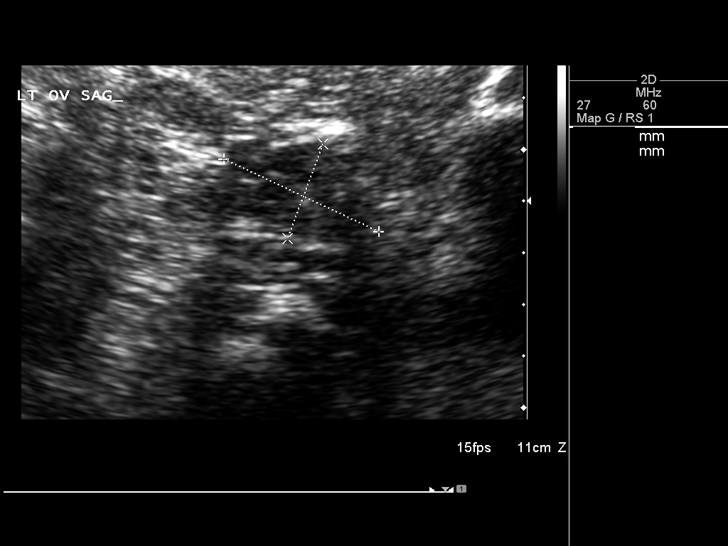
[im 18/43]
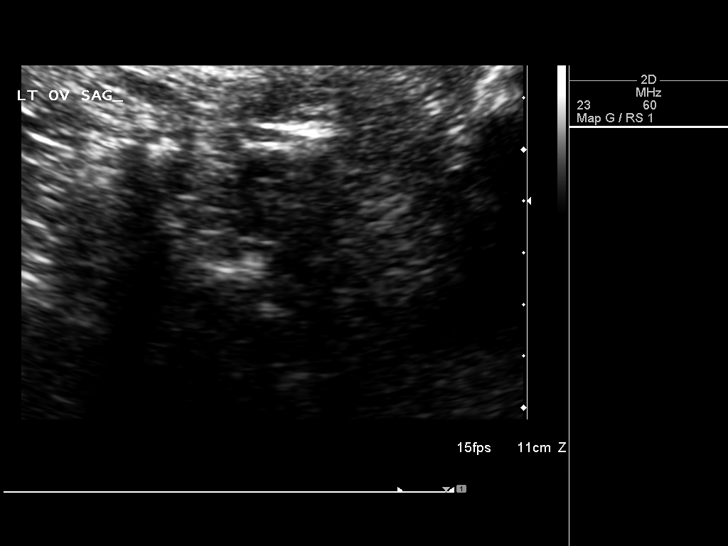
[im 22/43]
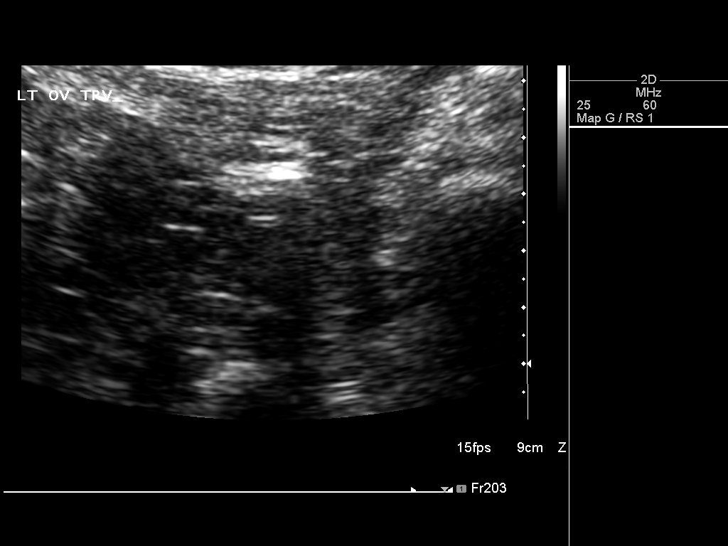
[im 25/43]
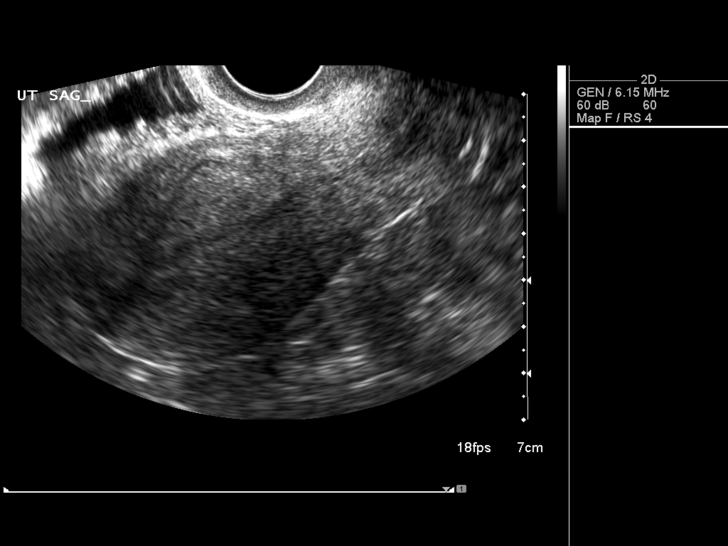
[im 29/43]
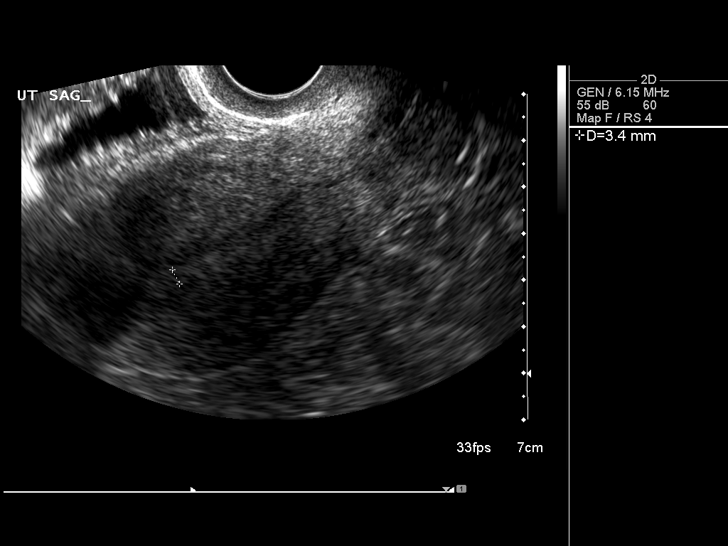
[im 32/43]
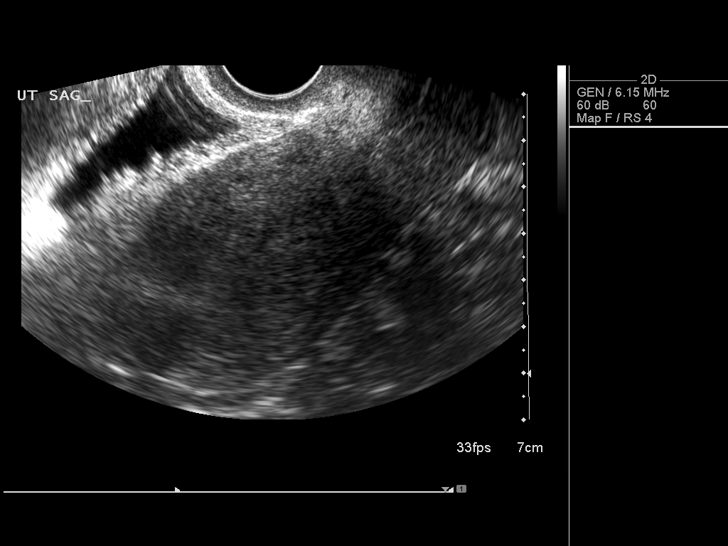
[im 36/43]
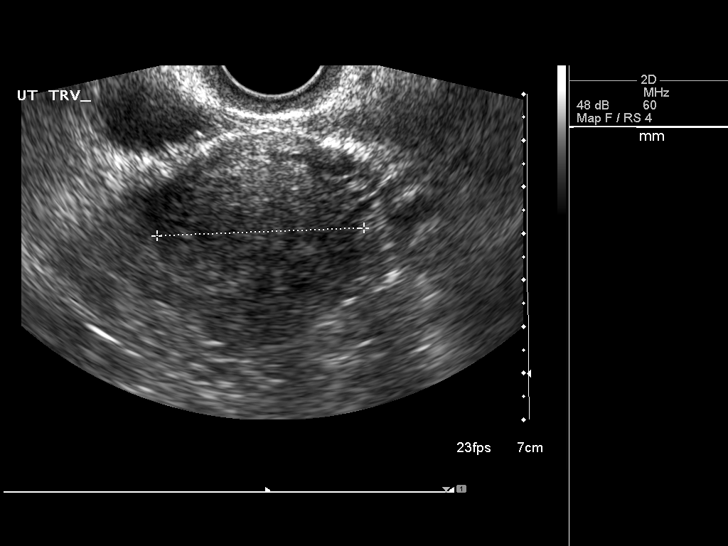
[im 39/43]
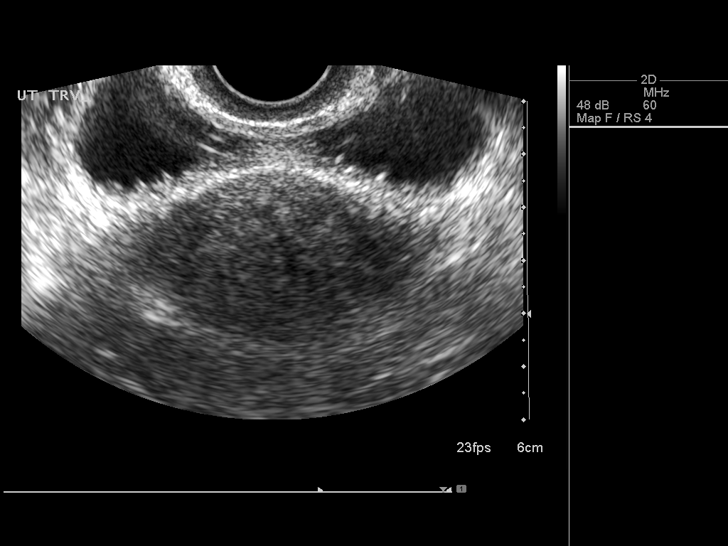
[im 43/43]
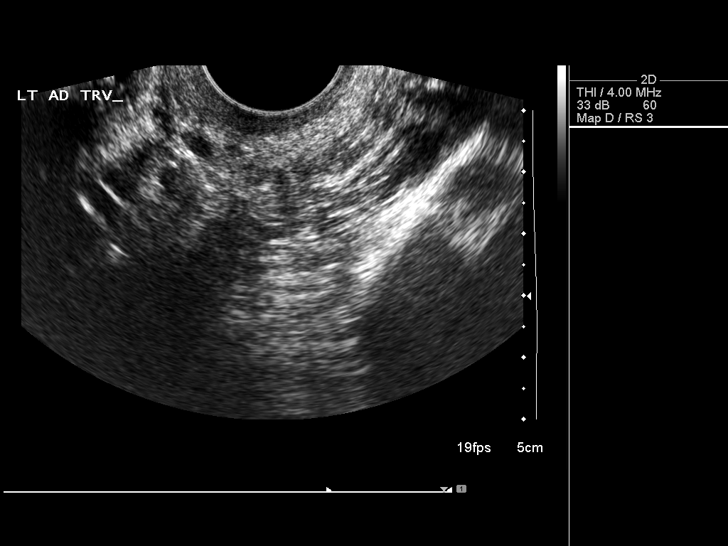

[13 of 25 positions shown; findings below may reference images not displayed]

FINDINGS: Uterus

Normal in size and appearance measuring 10.1 x 5.5 x 5.5 cm. No
discrete uterine mass.

Endometrium

Thickness: 3.4 mm.  No focal abnormality visualized.

Right ovary

Not visualized either with transabdominal or endovaginal imaging. No
discrete right-sided adnexal lesions

Left ovary

Only seen on transabdominal imaging. The left ovary appears normal
in size measuring 3.3 x 2.0 x 1.7 cm. No discrete left-sided ovarian
or adnexal lesions.

Other findings

No free fluid.
IMPRESSION: No explanation for patient's post menopausal bleeding. Specifically,
normal size and appearance of the endometrium. In the setting of
post-menopausal bleeding, this is consistent with a benign etiology
such as endometrial atrophy. If bleeding remains unresponsive to
hormonal or medical therapy, sonohysterogram should be considered
for focal lesion work-up. (Ref: Radiological Reasoning: Algorithmic
Workup of Abnormal Vaginal Bleeding with Endovaginal Sonography and
Sonohysterography. AJR [id number]; 191:S68-73)

## 2015-08-17 IMAGING — XA DG FLUORO GUIDE NDL PLC/BX
1 series · 4 of 4 positions shown · non-contrast
Comparison: none

CLINICAL DATA: RIGHT hip pain.  RIGHT hip arthritis.

[Series 2: ortho standard · 4 of 28 frames shown]
[frame 5/28]
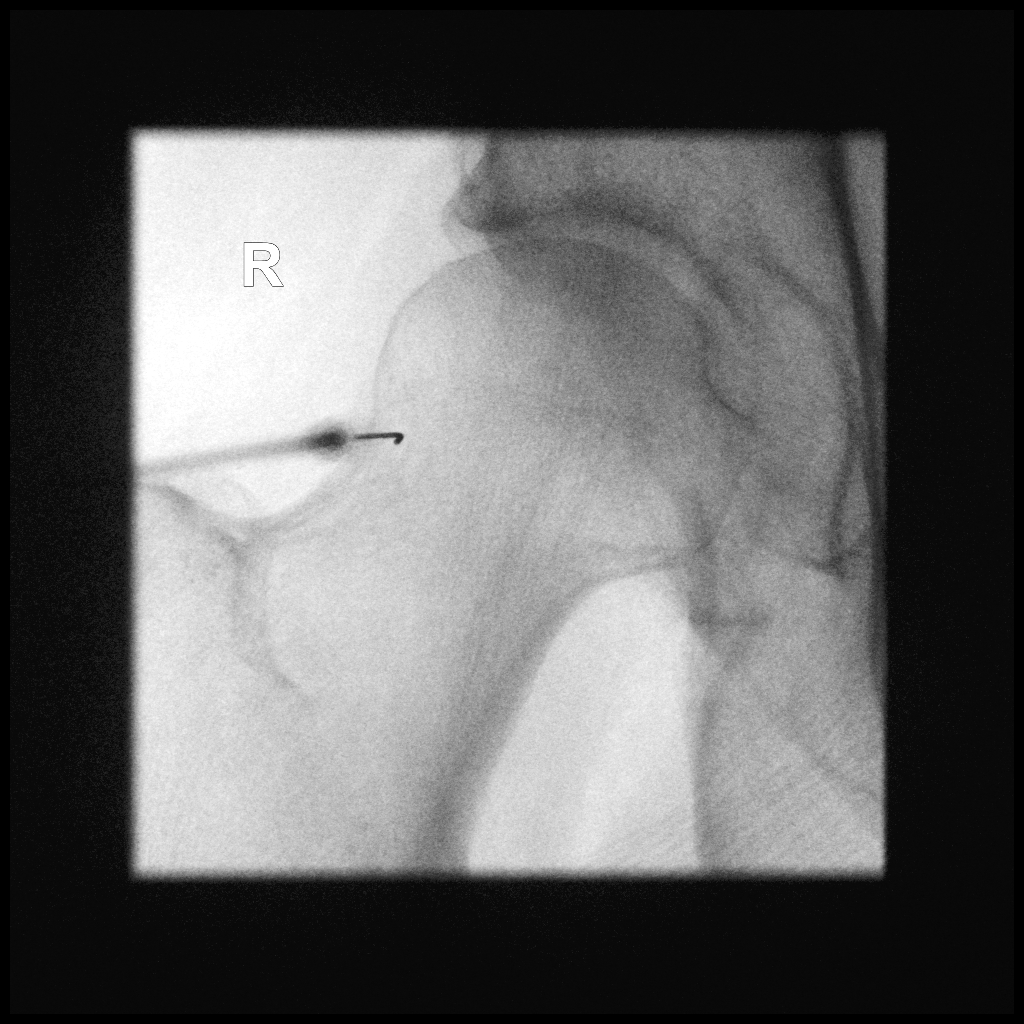
[frame 9/28]
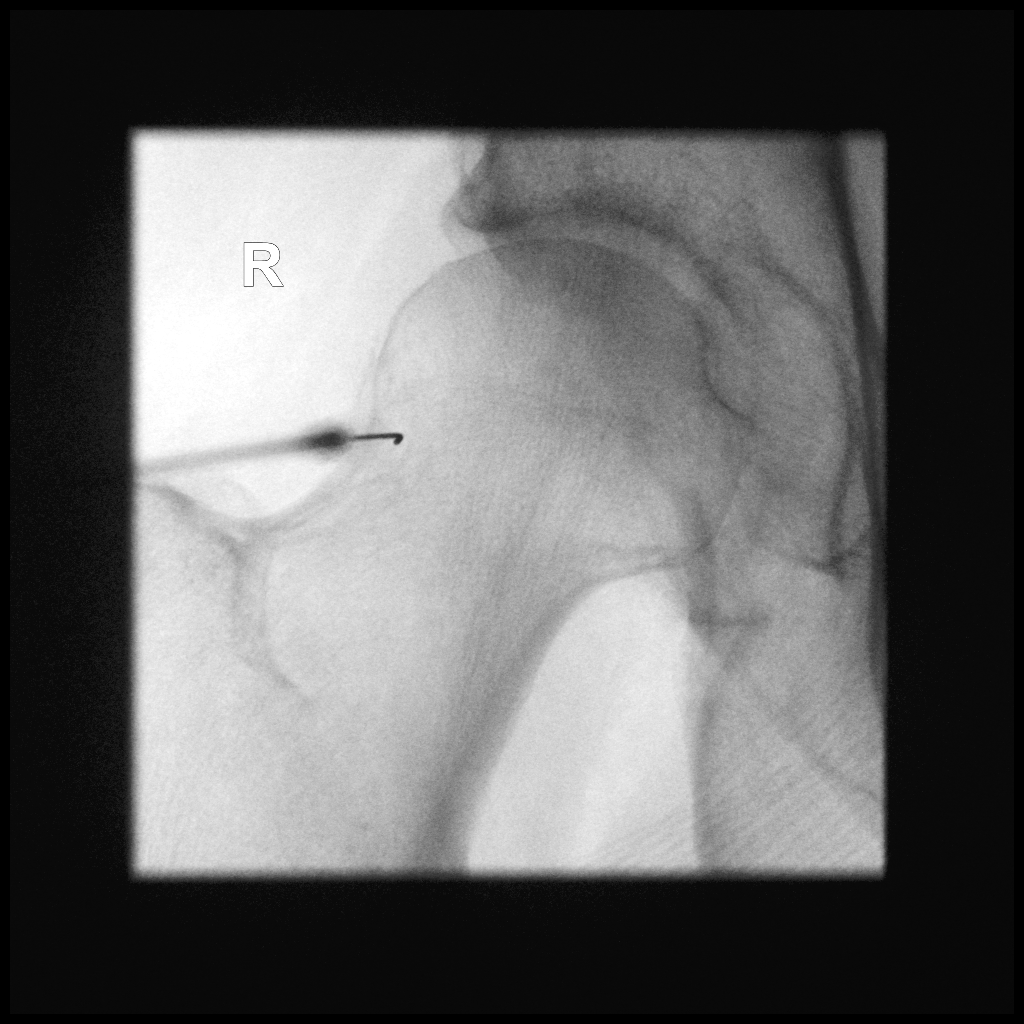
[frame 15/28]
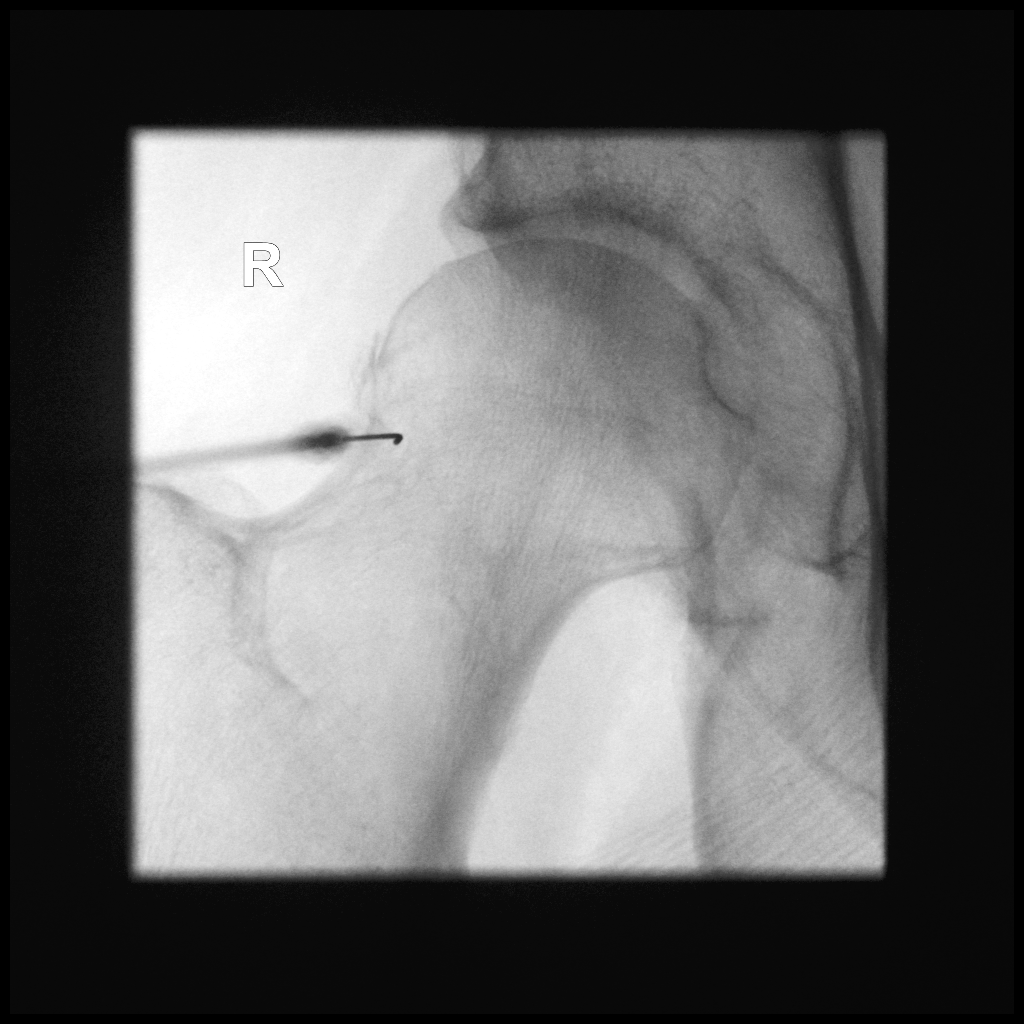
[frame 24/28]
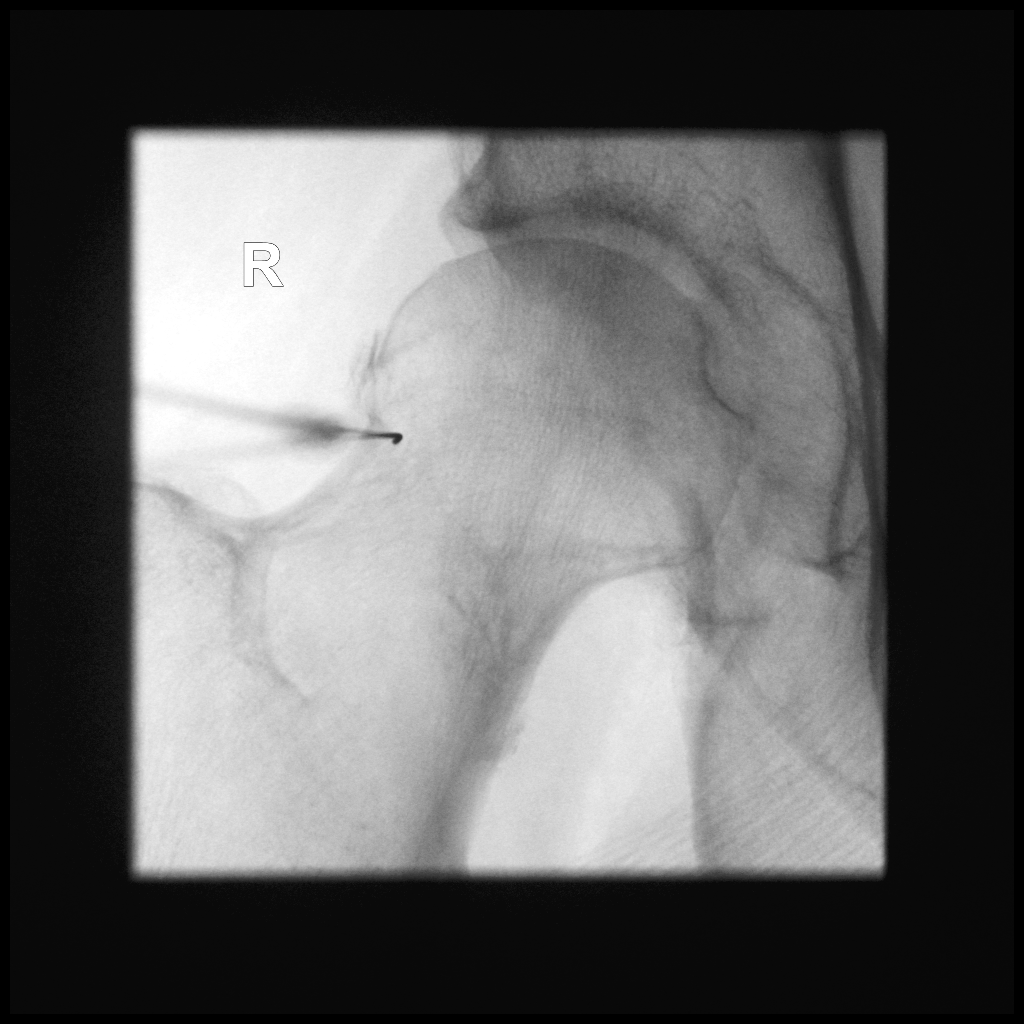

[4 of 4 positions shown; findings below may reference images not displayed]

FLUOROSCOPY TIME:  0 minutes 16 seconds

Dose area product: [id number]*m^2

PROCEDURE:
HIP INJECTION UNDER FLUOROSCOPY

After a thorough discussion of risks and benefits of the procedure
including bleeding, infection, injuries to adjacent structures and
extra-articular injection, written and oral informed consent was
obtained. Verbal consent was obtained by Dr. Jums. Time out form
completed (when appropriate).

The patient was placed supine on the fluoroscopy table. Preliminary
localization of the RIGHT hip was performed. The skin was prepped
and draped in the usual sterile fashion. Local anesthesia was
provided with 1% Lidocaine without Epinephrine. Under fluoroscopic
guidance, a 22 gauge 3 1/2 inch spinal needle was advanced into the
hip joint. Subsequently injection of 3 mL omnipaque 180 contrast
agent confirmed intra-articular placement. No vascular uptake. The
image was captured showing intra-articular contrast and a solution
of 120 mg Depo-Medrol and 1% Lidocaine without Epinephrine for a
volume of 3 ml along with 2 ml of 0.50% Sensorcaine was injected
into the hip. The needles were removed and a sterile dressing
applied. The patient tolerated a procedure well and was discharged.
IMPRESSION: Technically successful RIGHT hip injection of steroid and
anesthetic.

## 2015-09-15 DIAGNOSIS — M25552 Pain in left hip: Secondary | ICD-10-CM | POA: Diagnosis not present

## 2015-09-16 DIAGNOSIS — M25552 Pain in left hip: Secondary | ICD-10-CM | POA: Diagnosis not present

## 2015-09-16 DIAGNOSIS — M25551 Pain in right hip: Secondary | ICD-10-CM | POA: Diagnosis not present

## 2015-09-16 DIAGNOSIS — M545 Low back pain: Secondary | ICD-10-CM | POA: Diagnosis not present

## 2015-09-16 DIAGNOSIS — M542 Cervicalgia: Secondary | ICD-10-CM | POA: Diagnosis not present

## 2015-09-20 DIAGNOSIS — M25552 Pain in left hip: Secondary | ICD-10-CM | POA: Diagnosis not present

## 2015-09-20 DIAGNOSIS — M545 Low back pain: Secondary | ICD-10-CM | POA: Diagnosis not present

## 2015-09-20 DIAGNOSIS — M542 Cervicalgia: Secondary | ICD-10-CM | POA: Diagnosis not present

## 2015-09-20 DIAGNOSIS — M25551 Pain in right hip: Secondary | ICD-10-CM | POA: Diagnosis not present

## 2015-09-23 DIAGNOSIS — M25551 Pain in right hip: Secondary | ICD-10-CM | POA: Diagnosis not present

## 2015-09-23 DIAGNOSIS — M25552 Pain in left hip: Secondary | ICD-10-CM | POA: Diagnosis not present

## 2015-09-23 DIAGNOSIS — M542 Cervicalgia: Secondary | ICD-10-CM | POA: Diagnosis not present

## 2015-09-23 DIAGNOSIS — M545 Low back pain: Secondary | ICD-10-CM | POA: Diagnosis not present

## 2015-09-24 DIAGNOSIS — D2262 Melanocytic nevi of left upper limb, including shoulder: Secondary | ICD-10-CM | POA: Diagnosis not present

## 2015-09-24 DIAGNOSIS — L821 Other seborrheic keratosis: Secondary | ICD-10-CM | POA: Diagnosis not present

## 2015-09-24 DIAGNOSIS — D1801 Hemangioma of skin and subcutaneous tissue: Secondary | ICD-10-CM | POA: Diagnosis not present

## 2015-09-24 DIAGNOSIS — D2261 Melanocytic nevi of right upper limb, including shoulder: Secondary | ICD-10-CM | POA: Diagnosis not present

## 2015-09-27 DIAGNOSIS — M25551 Pain in right hip: Secondary | ICD-10-CM | POA: Diagnosis not present

## 2015-09-27 DIAGNOSIS — M542 Cervicalgia: Secondary | ICD-10-CM | POA: Diagnosis not present

## 2015-09-27 DIAGNOSIS — M545 Low back pain: Secondary | ICD-10-CM | POA: Diagnosis not present

## 2015-09-27 DIAGNOSIS — M25552 Pain in left hip: Secondary | ICD-10-CM | POA: Diagnosis not present

## 2015-09-30 DIAGNOSIS — M542 Cervicalgia: Secondary | ICD-10-CM | POA: Diagnosis not present

## 2015-09-30 DIAGNOSIS — M25552 Pain in left hip: Secondary | ICD-10-CM | POA: Diagnosis not present

## 2015-09-30 DIAGNOSIS — M25551 Pain in right hip: Secondary | ICD-10-CM | POA: Diagnosis not present

## 2015-09-30 DIAGNOSIS — M545 Low back pain: Secondary | ICD-10-CM | POA: Diagnosis not present

## 2015-10-12 DIAGNOSIS — M25551 Pain in right hip: Secondary | ICD-10-CM | POA: Diagnosis not present

## 2015-10-12 DIAGNOSIS — M542 Cervicalgia: Secondary | ICD-10-CM | POA: Diagnosis not present

## 2015-10-12 DIAGNOSIS — M545 Low back pain: Secondary | ICD-10-CM | POA: Diagnosis not present

## 2015-10-12 DIAGNOSIS — M25552 Pain in left hip: Secondary | ICD-10-CM | POA: Diagnosis not present

## 2015-10-13 ENCOUNTER — Ambulatory Visit (INDEPENDENT_AMBULATORY_CARE_PROVIDER_SITE_OTHER): Payer: Self-pay | Admitting: Orthopaedic Surgery

## 2015-10-18 DIAGNOSIS — M25552 Pain in left hip: Secondary | ICD-10-CM | POA: Diagnosis not present

## 2015-10-18 DIAGNOSIS — M542 Cervicalgia: Secondary | ICD-10-CM | POA: Diagnosis not present

## 2015-10-18 DIAGNOSIS — M545 Low back pain: Secondary | ICD-10-CM | POA: Diagnosis not present

## 2015-10-18 DIAGNOSIS — M25551 Pain in right hip: Secondary | ICD-10-CM | POA: Diagnosis not present

## 2015-10-19 DIAGNOSIS — E8881 Metabolic syndrome: Secondary | ICD-10-CM | POA: Diagnosis not present

## 2015-10-19 DIAGNOSIS — E559 Vitamin D deficiency, unspecified: Secondary | ICD-10-CM | POA: Diagnosis not present

## 2015-10-19 DIAGNOSIS — E039 Hypothyroidism, unspecified: Secondary | ICD-10-CM | POA: Diagnosis not present

## 2015-10-20 DIAGNOSIS — M545 Low back pain: Secondary | ICD-10-CM | POA: Diagnosis not present

## 2015-10-20 DIAGNOSIS — M542 Cervicalgia: Secondary | ICD-10-CM | POA: Diagnosis not present

## 2015-10-20 DIAGNOSIS — M25551 Pain in right hip: Secondary | ICD-10-CM | POA: Diagnosis not present

## 2015-10-20 DIAGNOSIS — M25552 Pain in left hip: Secondary | ICD-10-CM | POA: Diagnosis not present

## 2015-11-25 DIAGNOSIS — E559 Vitamin D deficiency, unspecified: Secondary | ICD-10-CM | POA: Diagnosis not present

## 2015-11-25 DIAGNOSIS — E282 Polycystic ovarian syndrome: Secondary | ICD-10-CM | POA: Diagnosis not present

## 2015-11-25 DIAGNOSIS — E039 Hypothyroidism, unspecified: Secondary | ICD-10-CM | POA: Diagnosis not present

## 2015-11-25 DIAGNOSIS — E8881 Metabolic syndrome: Secondary | ICD-10-CM | POA: Diagnosis not present

## 2015-12-06 ENCOUNTER — Ambulatory Visit (INDEPENDENT_AMBULATORY_CARE_PROVIDER_SITE_OTHER): Payer: Self-pay | Admitting: Orthopaedic Surgery

## 2015-12-15 ENCOUNTER — Ambulatory Visit (INDEPENDENT_AMBULATORY_CARE_PROVIDER_SITE_OTHER): Payer: Self-pay | Admitting: Orthopaedic Surgery

## 2015-12-24 ENCOUNTER — Other Ambulatory Visit: Payer: Self-pay | Admitting: Family Medicine

## 2015-12-24 ENCOUNTER — Other Ambulatory Visit (HOSPITAL_COMMUNITY)
Admission: RE | Admit: 2015-12-24 | Discharge: 2015-12-24 | Disposition: A | Discharge status: Home / Self Care | Payer: BLUE CROSS/BLUE SHIELD | Source: Ambulatory Visit | Attending: Family Medicine | Admitting: Family Medicine

## 2015-12-24 DIAGNOSIS — Z124 Encounter for screening for malignant neoplasm of cervix: Secondary | ICD-10-CM | POA: Diagnosis not present

## 2015-12-24 DIAGNOSIS — N951 Menopausal and female climacteric states: Secondary | ICD-10-CM | POA: Diagnosis not present

## 2015-12-24 DIAGNOSIS — Z0000 Encounter for general adult medical examination without abnormal findings: Secondary | ICD-10-CM | POA: Diagnosis not present

## 2015-12-24 DIAGNOSIS — G479 Sleep disorder, unspecified: Secondary | ICD-10-CM | POA: Diagnosis not present

## 2015-12-28 LAB — CYTOLOGY - PAP: Diagnosis: NEGATIVE

## 2015-12-30 DIAGNOSIS — D126 Benign neoplasm of colon, unspecified: Secondary | ICD-10-CM | POA: Diagnosis not present

## 2015-12-30 DIAGNOSIS — D123 Benign neoplasm of transverse colon: Secondary | ICD-10-CM | POA: Diagnosis not present

## 2015-12-30 DIAGNOSIS — R194 Change in bowel habit: Secondary | ICD-10-CM | POA: Diagnosis not present

## 2016-01-06 DIAGNOSIS — D126 Benign neoplasm of colon, unspecified: Secondary | ICD-10-CM | POA: Diagnosis not present

## 2016-04-05 DIAGNOSIS — E039 Hypothyroidism, unspecified: Secondary | ICD-10-CM | POA: Diagnosis not present

## 2016-04-05 DIAGNOSIS — R7301 Impaired fasting glucose: Secondary | ICD-10-CM | POA: Diagnosis not present

## 2016-04-05 DIAGNOSIS — E7800 Pure hypercholesterolemia, unspecified: Secondary | ICD-10-CM | POA: Diagnosis not present

## 2016-04-05 DIAGNOSIS — E559 Vitamin D deficiency, unspecified: Secondary | ICD-10-CM | POA: Diagnosis not present

## 2016-09-21 DIAGNOSIS — Z1231 Encounter for screening mammogram for malignant neoplasm of breast: Secondary | ICD-10-CM | POA: Diagnosis not present

## 2016-09-26 ENCOUNTER — Emergency Department (HOSPITAL_COMMUNITY)
Admission: EM | Admit: 2016-09-26 | Discharge: 2016-09-27 | Disposition: A | Discharge status: Home / Self Care | Payer: BLUE CROSS/BLUE SHIELD | Attending: Emergency Medicine | Admitting: Emergency Medicine

## 2016-09-26 ENCOUNTER — Emergency Department (HOSPITAL_COMMUNITY): Payer: BLUE CROSS/BLUE SHIELD

## 2016-09-26 ENCOUNTER — Encounter (HOSPITAL_COMMUNITY): Payer: Self-pay

## 2016-09-26 DIAGNOSIS — Z85828 Personal history of other malignant neoplasm of skin: Secondary | ICD-10-CM | POA: Insufficient documentation

## 2016-09-26 DIAGNOSIS — I4891 Unspecified atrial fibrillation: Secondary | ICD-10-CM | POA: Diagnosis not present

## 2016-09-26 DIAGNOSIS — Z96643 Presence of artificial hip joint, bilateral: Secondary | ICD-10-CM | POA: Insufficient documentation

## 2016-09-26 DIAGNOSIS — R002 Palpitations: Secondary | ICD-10-CM | POA: Diagnosis not present

## 2016-09-26 DIAGNOSIS — E039 Hypothyroidism, unspecified: Secondary | ICD-10-CM | POA: Diagnosis not present

## 2016-09-26 DIAGNOSIS — I10 Essential (primary) hypertension: Secondary | ICD-10-CM | POA: Diagnosis not present

## 2016-09-26 DIAGNOSIS — R000 Tachycardia, unspecified: Secondary | ICD-10-CM | POA: Diagnosis not present

## 2016-09-26 DIAGNOSIS — Z79899 Other long term (current) drug therapy: Secondary | ICD-10-CM | POA: Insufficient documentation

## 2016-09-26 DIAGNOSIS — R0789 Other chest pain: Secondary | ICD-10-CM | POA: Diagnosis not present

## 2016-09-26 LAB — CBC
HEMATOCRIT: 42.7 % (ref 36.0–46.0)
HEMOGLOBIN: 14.5 g/dL (ref 12.0–15.0)
MCH: 31 pg (ref 26.0–34.0)
MCHC: 34 g/dL (ref 30.0–36.0)
MCV: 91.4 fL (ref 78.0–100.0)
Platelets: 207 10*3/uL (ref 150–400)
RBC: 4.67 MIL/uL (ref 3.87–5.11)
RDW: 12.6 % (ref 11.5–15.5)
WBC: 5.2 10*3/uL (ref 4.0–10.5)

## 2016-09-26 LAB — BASIC METABOLIC PANEL
ANION GAP: 7 (ref 5–15)
BUN: 12 mg/dL (ref 6–20)
CALCIUM: 9.8 mg/dL (ref 8.9–10.3)
CHLORIDE: 106 mmol/L (ref 101–111)
CO2: 28 mmol/L (ref 22–32)
Creatinine, Ser: 0.69 mg/dL (ref 0.44–1.00)
GFR calc non Af Amer: >60 mL/min (ref >60)
GLUCOSE: 161 mg/dL — AB (ref 65–99)
POTASSIUM: 3.5 mmol/L (ref 3.5–5.1)
Sodium: 141 mmol/L (ref 135–145)

## 2016-09-26 LAB — I-STAT TROPONIN, ED: TROPONIN I, POC: 0 ng/mL (ref 0.00–0.08)

## 2016-09-26 LAB — MAGNESIUM: Magnesium: 2.1 mg/dL (ref 1.7–2.4)

## 2016-09-26 MED ORDER — DILTIAZEM LOAD VIA INFUSION
10.0000 mg | Freq: Once | INTRAVENOUS | Status: AC
  Administered 2016-09-26: 10 mg via INTRAVENOUS
  Filled 2016-09-26: qty 10

## 2016-09-26 MED ORDER — DEXTROSE 5 % IV SOLN
5.0000 mg/h | INTRAVENOUS | Status: DC
  Administered 2016-09-26: 5 mg/h via INTRAVENOUS
  Filled 2016-09-26: qty 100

## 2016-09-26 NOTE — ED Triage Notes (Signed)
Pt states that around 830 started to feel heart race. Denies CP, denies SOB. HR 150-160 afib, no history

## 2016-09-26 NOTE — ED Provider Notes (Signed)
[facility] DEPT Provider Note   CSN: [id number] Arrival date & time: 09/26/16  2209     History   Chief Complaint Chief Complaint  Patient presents with  . Atrial Fibrillation    HPI Sarah Blackwell is a 61 y.o. female.  Patient with PMH of HTN and hypothyroid presents to the ED with a chief complaint of heart palpitations.  She originally states that the onset was tonight at 8:30 PM, but questions whether or not she was having episodes of the same on Saturday.  She has never been diagnosed with afib before.  She is not anticoagulated.  She denies any chest pain, or SOB, but did have some SOB earlier.  She denies any other associated symptoms. She has taken some aspirin.     The history is provided by the patient. No language interpreter was used.    Past Medical History:  Diagnosis Date  . Allergic rhinitis   . Anxiety   . Arthritis   . Cancer ([place])    skin cancer  . Complication of anesthesia    difficulty waking up after surgery as a child.   . Fluid retention   . Headache    migraines when she was younger  . Hyperlipidemia   . Hypertension    never has taken medications  . Hypothyroidism   . PCOS (polycystic ovarian syndrome)   . Perimenopausal   . PONV (postoperative nausea and vomiting)   . Pre-diabetes   . Rosacea   . Wears glasses     Patient Active Problem List   Diagnosis Date Noted  . Osteoarthritis of left hip 03/16/2015  . Status post total replacement of left hip 03/16/2015  . Status post total replacement of right hip 06/30/2014  . Lumbar back pain with radiculopathy affecting right lower extremity 04/09/2014  . DDD (degenerative disc disease), lumbar 04/09/2014  . Chronic right hip pain 01/28/2014  . Osteoarthritis of right hip 01/28/2014    Past Surgical History:  Procedure Laterality Date  . COLONOSCOPY    . FERTILITY SURGERY    . MOHS SURGERY     x 2  . TONSILLECTOMY    . TOTAL HIP ARTHROPLASTY Right 06/30/2014   Procedure:  RIGHT TOTAL HIP ARTHROPLASTY ANTERIOR APPROACH;  Surgeon: Mcarthur Rossetti, MD;  Location: [facility];  Service: Orthopedics;  Laterality: Right;  . TOTAL HIP ARTHROPLASTY Left 03/16/2015   Procedure: LEFT TOTAL HIP ARTHROPLASTY ANTERIOR APPROACH;  Surgeon: Mcarthur Rossetti, MD;  Location: [facility];  Service: Orthopedics;  Laterality: Left;    OB History    No data available       Home Medications    Prior to Admission medications   Medication Sig Start Date End Date Taking? Authorizing Provider  ALPRAZolam Duanne Moron) 0.5 MG tablet Take 0.5 mg by mouth at bedtime as needed for sleep.    [provider]  aspirin EC 325 MG EC tablet Take 1 tablet (325 mg total) by mouth 2 (two) times daily after a meal. 03/18/15   Carlis Abbott, Gillermo Murdoch, PA-C  estradiol (VIVELLE-DOT) 0.0375 MG/24HR Place 1 patch onto the skin 2 (two) times a week. Nancy Fetter and Wed 12/23/14   [provider]  Eszopiclone (ESZOPICLONE) 3 MG TABS Take 3 mg by mouth at bedtime as needed (sleep).     [provider]  fluticasone (FLONASE) 50 MCG/ACT nasal spray Place 1-2 sprays into both nostrils daily as needed for allergies.    [provider]  levothyroxine ([place], [place]) 75  MCG tablet Take 75 mcg by mouth daily before breakfast.    [provider]  liothyronine (CYTOMEL) 5 MCG tablet Take 5 mcg by mouth daily.    [provider]  methocarbamol (ROBAXIN) 500 MG tablet Take 1 tablet (500 mg total) by mouth every 6 (six) hours as needed for muscle spasms. 03/18/15   Pete Pelt, PA-C  naproxen (NAPROSYN) 500 MG tablet Take 500 mg by mouth 2 (two) times daily with a meal.    [provider]  oxyCODONE (OXY IR/ROXICODONE) 5 MG immediate release tablet Take 1-3 tablets (5-15 mg total) by mouth every 4 (four) hours as needed for severe pain or breakthrough pain. 03/18/15   Pete Pelt, PA-C  Vitamin D, Ergocalciferol, (DRISDOL) 50000 units CAPS capsule Take 50,000 Units  by mouth every 7 (seven) days. Sat or Sun    [provider]    Family History Family History  Problem Relation Age of Onset  . Anemia Mother   . Hyperlipidemia Mother   . Hypertension Mother   . Stroke Father   . Hyperlipidemia Father   . Cancer Father   . CVA Father   . Cancer Other   . Hypertension Other   . Hyperlipidemia Other   . Stroke Other   . Heart attack Maternal Grandfather   . Cancer Maternal Grandfather   . Cancer Paternal Grandfather   . Alcohol abuse Paternal Grandfather     Social History Social History  Substance Use Topics  . Smoking status: Never Smoker  . Smokeless tobacco: Never Used  . Alcohol use 8.4 oz/week    14 Glasses of wine per week     Comment: 1-2 glasses of wine daily     Allergies   Sulfa antibiotics   Review of Systems Review of Systems  All other systems reviewed and are negative.    Physical Exam Updated Vital Signs BP (!) 156/129 (BP Location: Left Arm)   Pulse 88   Temp (!) 97.5 F (36.4 C) (Oral)   Resp 15   SpO2 99%   Physical Exam  Constitutional: She is oriented to person, place, and time. She appears well-developed and well-nourished.  HENT:  Head: Normocephalic and atraumatic.  Eyes: Pupils are equal, round, and reactive to light. Conjunctivae and EOM are normal.  Neck: Normal range of motion. Neck supple.  Cardiovascular: Exam reveals no gallop and no friction rub.   No murmur heard. Tachycardic, irregularly irregular  Pulmonary/Chest: Effort normal and breath sounds normal. No respiratory distress. She has no wheezes. She has no rales. She exhibits no tenderness.  Abdominal: Soft. Bowel sounds are normal. She exhibits no distension and no mass. There is no tenderness. There is no rebound and no guarding.  Musculoskeletal: Normal range of motion. She exhibits no edema or tenderness.  Neurological: She is alert and oriented to person, place, and time.  Skin: Skin is warm and dry.  Psychiatric: She  has a normal mood and affect. Her behavior is normal. Judgment and thought content normal.  Nursing note and vitals reviewed.    ED Treatments / Results  Labs (all labs ordered are listed, but only abnormal results are displayed) Labs Reviewed  BASIC METABOLIC PANEL - Abnormal; Notable for the following:       Result Value   Glucose, Bld 161 (*)    All other components within normal limits  CBC  I-STAT TROPONIN, ED    EKG  EKG Interpretation None  Radiology No results found.  Procedures Procedures (including critical care time) CRITICAL CARE Performed by: Montine Circle   Total critical care time: 46 minutes  Critical care time was exclusive of separately billable procedures and treating other patients.  Critical care was necessary to treat or prevent imminent or life-threatening deterioration.  Critical care was time spent personally by me on the following activities: development of treatment plan with patient and/or surrogate as well as nursing, discussions with consultants, evaluation of patient's response to treatment, examination of patient, obtaining history from patient or surrogate, ordering and performing treatments and interventions, ordering and review of laboratory studies, ordering and review of radiographic studies, pulse oximetry and re-evaluation of patient's condition.  Medications Ordered in ED Medications  diltiazem (CARDIZEM) 1 mg/mL load via infusion 10 mg (10 mg Intravenous Bolus from Bag 09/26/16 2335)    And  diltiazem (CARDIZEM) 100 mg in dextrose 5 % 100 mL (1 mg/mL) infusion (5 mg/hr Intravenous New Bag/Given 09/26/16 2336)     Initial Impression / Assessment and Plan / ED Course  I have reviewed the triage vital signs and the nursing notes.  Pertinent labs & imaging results that were available during my care of the patient were reviewed by me and considered in my medical decision making (see chart for details).     Patient with  presumably new onset afib with RVR, however, questionable onset, could be out of the 48 hour window.  Had a lengthy conversation with the patient about treatment options.  Discussed the patient with Dr. Aundra Dubin, who only recommends cardioversion if the patient has a very clear onset within 48 hours.  Since the patient is now questioning this, will hold off on cardioversion at this time and will treat with diltiazem load and infusion.  Will admit to medicine.  Cardiology available as needed by medicine.    CHADS2 score is 1 for hypertension.  2:36 AM Patient converted to normal sinus.  Plan for discharge now given that she has converted.  Will need follow-up in the afib clinic.  Will anticoagulate given CHADS2 score.  Will discharge with diltiazem 30mg  and eliquis.   Final Clinical Impressions(s) / ED Diagnoses   Final diagnoses:  Atrial fibrillation with RVR (HCC)    New Prescriptions New Prescriptions   APIXABAN (ELIQUIS) 5 MG TABS TABLET    Take 1 tablet (5 mg total) by mouth 2 (two) times daily.   DILTIAZEM (CARDIZEM SR) 60 MG 12 HR CAPSULE    Take 1 capsule (60 mg total) by mouth 2 (two) times daily.     Montine Circle, PA-C 09/27/16 7741    Merryl Hacker, MD 09/27/16 [phone]

## 2016-09-27 ENCOUNTER — Telehealth (HOSPITAL_COMMUNITY): Payer: Self-pay | Admitting: *Deleted

## 2016-09-27 MED ORDER — DILTIAZEM HCL ER 60 MG PO CP12: 60.0000 mg | ORAL_CAPSULE | Freq: Two times a day (BID) | ORAL | 0 refills | Status: DC

## 2016-09-27 MED ORDER — DILTIAZEM HCL 30 MG PO TABS
30.0000 mg | ORAL_TABLET | Freq: Once | ORAL | Status: AC
  Administered 2016-09-27: 30 mg via ORAL
  Filled 2016-09-27: qty 1

## 2016-09-27 MED ORDER — APIXABAN 5 MG PO TABS
5.0000 mg | ORAL_TABLET | Freq: Two times a day (BID) | ORAL | 0 refills | Status: DC
Start: 2016-09-27 — End: 2017-12-18

## 2016-09-27 NOTE — ED Notes (Signed)
Pt ambulatory to restroom with steady gait; pt noted to be in NSR, repeat EKG given to Dr.Horton

## 2016-09-27 NOTE — Discharge Instructions (Signed)
You were seen today and found to be in atrial fibrillation.You will be discharged on rate control medication as well as a blood thinner. Follow-up closely with cardiology. If you develop recurrent symptoms or worsening symptoms you need to be reevaluated.

## 2016-09-27 NOTE — Telephone Encounter (Signed)
LMOM; pt referred by ED for afib

## 2016-09-27 NOTE — ED Provider Notes (Signed)
Medical screening examination/treatment/procedure(s) were conducted as a shared visit with non-physician practitioner(s) and myself.  I personally evaluated the patient during the encounter.   EKG Interpretation  Date/Time:  Tuesday September 26 2016 22:22:33 EDT Ventricular Rate:  149 PR Interval:    QRS Duration: 82 QT Interval:  288 QTC Calculation: 453 R Axis:   39 Text Interpretation:  Atrial fibrillation with rapid ventricular response Low voltage QRS Cannot rule out Anterior infarct , age undetermined ST & T wave abnormality, consider inferior ischemia or digitalis effect Abnormal ECG Confirmed by Thayer Jew [phone]) on 09/27/2016 2:16:22 AM       EKG Interpretation  Date/Time:  Wednesday September 27 2016 02:10:43 EDT Ventricular Rate:  79 PR Interval:    QRS Duration: 88 QT Interval:  371 QTC Calculation: 426 R Axis:   -28 Text Interpretation:  Sinus rhythm Borderline left axis deviation Low voltage, precordial leads Confirmed by Thayer Jew [phone]) on 09/27/2016 2:17:00 AM      Patient presents with new onset atrial fibrillation with RVR. States that she started feeling bad tonight at dinner. She has had several episodes of the same over the last several months including Saturday night. She is found to be in atrial fibrillation with RVR. No known history of the same.  Sometimes she relates it to increased alcohol consumption. She is nontoxic-appearing. Denies chest pain or shortness of breath. Patient had similar episode on Saturday night and states he is unsure exactly when she started feeling poorly but generally has not felt well over the last several days. Given that we cannot exactly know onset of symptoms, would recommend diltiazem for rate control and admission for TEE and possible cardioversion. This was discussed with the patient. Fortunately, patient cardioverted spontaneously while on diltiazem. She was converted to oral diltiazem. Will discharge with Eliquis  and daily diltiazem for rate control.  CRITICAL CARE Performed by: Merryl Hacker   Total critical care time: 30 minutes  Critical care time was exclusive of separately billable procedures and treating other patients.  Critical care was necessary to treat or prevent imminent or life-threatening deterioration.  Critical care was time spent personally by me on the following activities: development of treatment plan with patient and/or surrogate as well as nursing, discussions with consultants, evaluation of patient's response to treatment, examination of patient, obtaining history from patient or surrogate, ordering and performing treatments and interventions, ordering and review of laboratory studies, ordering and review of radiographic studies, pulse oximetry and re-evaluation of patient's condition.    Merryl Hacker, MD 09/27/16 [phone]

## 2016-09-29 ENCOUNTER — Telehealth: Payer: Self-pay | Admitting: *Deleted

## 2016-09-29 ENCOUNTER — Ambulatory Visit (HOSPITAL_COMMUNITY)
Admission: RE | Admit: 2016-09-29 | Discharge: 2016-09-29 | Disposition: A | Discharge status: Home / Self Care | Payer: BLUE CROSS/BLUE SHIELD | Source: Ambulatory Visit | Attending: Nurse Practitioner | Admitting: Nurse Practitioner

## 2016-09-29 ENCOUNTER — Encounter (HOSPITAL_COMMUNITY): Payer: Self-pay | Admitting: Nurse Practitioner

## 2016-09-29 VITALS — BP 118/76 | HR 62 | Ht 68.0 in | Wt 211.6 lb

## 2016-09-29 DIAGNOSIS — Z882 Allergy status to sulfonamides status: Secondary | ICD-10-CM | POA: Insufficient documentation

## 2016-09-29 DIAGNOSIS — I480 Paroxysmal atrial fibrillation: Secondary | ICD-10-CM

## 2016-09-29 DIAGNOSIS — Z79899 Other long term (current) drug therapy: Secondary | ICD-10-CM | POA: Insufficient documentation

## 2016-09-29 DIAGNOSIS — Z96643 Presence of artificial hip joint, bilateral: Secondary | ICD-10-CM | POA: Diagnosis not present

## 2016-09-29 DIAGNOSIS — I4891 Unspecified atrial fibrillation: Secondary | ICD-10-CM

## 2016-09-29 DIAGNOSIS — L719 Rosacea, unspecified: Secondary | ICD-10-CM | POA: Insufficient documentation

## 2016-09-29 DIAGNOSIS — E8881 Metabolic syndrome: Secondary | ICD-10-CM | POA: Diagnosis not present

## 2016-09-29 DIAGNOSIS — E039 Hypothyroidism, unspecified: Secondary | ICD-10-CM | POA: Diagnosis not present

## 2016-09-29 DIAGNOSIS — E785 Hyperlipidemia, unspecified: Secondary | ICD-10-CM | POA: Insufficient documentation

## 2016-09-29 DIAGNOSIS — Z823 Family history of stroke: Secondary | ICD-10-CM | POA: Diagnosis not present

## 2016-09-29 DIAGNOSIS — Z7901 Long term (current) use of anticoagulants: Secondary | ICD-10-CM | POA: Diagnosis not present

## 2016-09-29 DIAGNOSIS — F419 Anxiety disorder, unspecified: Secondary | ICD-10-CM | POA: Diagnosis not present

## 2016-09-29 DIAGNOSIS — Z85828 Personal history of other malignant neoplasm of skin: Secondary | ICD-10-CM | POA: Insufficient documentation

## 2016-09-29 DIAGNOSIS — I10 Essential (primary) hypertension: Secondary | ICD-10-CM | POA: Diagnosis not present

## 2016-09-29 DIAGNOSIS — Z791 Long term (current) use of non-steroidal anti-inflammatories (NSAID): Secondary | ICD-10-CM | POA: Diagnosis not present

## 2016-09-29 DIAGNOSIS — Z809 Family history of malignant neoplasm, unspecified: Secondary | ICD-10-CM | POA: Insufficient documentation

## 2016-09-29 DIAGNOSIS — Z811 Family history of alcohol abuse and dependence: Secondary | ICD-10-CM | POA: Diagnosis not present

## 2016-09-29 DIAGNOSIS — Z9889 Other specified postprocedural states: Secondary | ICD-10-CM | POA: Insufficient documentation

## 2016-09-29 DIAGNOSIS — E282 Polycystic ovarian syndrome: Secondary | ICD-10-CM | POA: Diagnosis not present

## 2016-09-29 DIAGNOSIS — Z8249 Family history of ischemic heart disease and other diseases of the circulatory system: Secondary | ICD-10-CM | POA: Insufficient documentation

## 2016-09-29 LAB — TSH: TSH: 1.244 u[IU]/mL (ref 0.350–4.500)

## 2016-09-29 MED ORDER — DILTIAZEM HCL ER COATED BEADS 120 MG PO CP24: 120.0000 mg | ORAL_CAPSULE | Freq: Every day | ORAL | 3 refills | Status: DC

## 2016-09-29 NOTE — Patient Instructions (Addendum)
Your physician has recommended you make the following change in your medication: 1)change cardizem 60 twice a day -- to Cardizem CD 120mg  once a day

## 2016-09-29 NOTE — Progress Notes (Addendum)
Primary Care Physician: Cari Caraway, MD Referring Physician: Dr. Leonides Schanz, [facility] ER f/u   Sarah Blackwell is a 61 y.o. female with a h/o HLD, HTN, impaired glucose tolerance, hypothyroidism that presented  to [facility] ER with palpitations that started while out with friends having dinner which included drinking wine. She has noted increased palpitations in the recent past associated with drinking alcohol. She has also noted sometimes waking up at night with rapid heart rate. She converted in the ER with cardizem drip. She was started on xarelto with a chadsvasc score of 2 for HTN. Also d/c with Cardizem 60 mg ER BID, which was fairly expensive for her..  Husband does state that she snores, she drinks wine nightly 1- 2 glasses, is a big fan of coffee. She has made an effort to cut back on caffeine. She also has gained weight over the last year, having both hips replaced and has not been very physically active.  Today, she denies symptoms of palpitations, chest pain, shortness of breath, orthopnea, PND, lower extremity edema, dizziness, presyncope, syncope, or neurologic sequela. The patient is tolerating medications without difficulties and is otherwise without complaint today.   Past Medical History:  Diagnosis Date  . Allergic rhinitis   . Anxiety   . Arthritis   . Cancer ([place])    skin cancer  . Complication of anesthesia    difficulty waking up after surgery as a child.   . Fluid retention   . Headache    migraines when she was younger  . Hyperlipidemia   . Hypertension    never has taken medications  . Hypothyroidism   . PCOS (polycystic ovarian syndrome)   . Perimenopausal   . PONV (postoperative nausea and vomiting)   . Pre-diabetes   . Rosacea   . Wears glasses    Past Surgical History:  Procedure Laterality Date  . COLONOSCOPY    . FERTILITY SURGERY    . MOHS SURGERY     x 2  . TONSILLECTOMY    . TOTAL HIP ARTHROPLASTY Right 06/30/2014   Procedure: RIGHT TOTAL HIP  ARTHROPLASTY ANTERIOR APPROACH;  Surgeon: Mcarthur Rossetti, MD;  Location: [place];  Service: Orthopedics;  Laterality: Right;  . TOTAL HIP ARTHROPLASTY Left 03/16/2015   Procedure: LEFT TOTAL HIP ARTHROPLASTY ANTERIOR APPROACH;  Surgeon: Mcarthur Rossetti, MD;  Location: [place];  Service: Orthopedics;  Laterality: Left;    Current Outpatient Prescriptions  Medication Sig Dispense Refill  . 5-Hydroxytryptophan (5-HTP PO) Take 1 tablet by mouth at bedtime.    Marland Kitchen apixaban (ELIQUIS) 5 MG TABS tablet Take 1 tablet (5 mg total) by mouth 2 (two) times daily. 60 tablet 0  . Cholecalciferol (VITAMIN D3) 1000 units CAPS Take 2 capsules by mouth daily.    Marland Kitchen estradiol (VIVELLE-DOT) 0.0375 MG/24HR Place 1 patch onto the skin 2 (two) times a week. Nancy Fetter and Wed  12  . Eszopiclone (ESZOPICLONE) 3 MG TABS Take 3 mg by mouth at bedtime as needed (sleep).     . fexofenadine (ALLEGRA) 30 MG tablet Take 30 mg by mouth daily as needed (allergies).    . fluticasone (FLONASE) 50 MCG/ACT nasal spray Place 1-2 sprays into both nostrils daily as needed for allergies.    Marland Kitchen ibuprofen (ADVIL,MOTRIN) 200 MG tablet Take 200 mg by mouth every 6 (six) hours as needed for moderate pain.    . L-TRYPTOPHAN PO Take 1 tablet by mouth at bedtime.    Marland Kitchen levothyroxine (SYNTHROID, LEVOTHROID) 75 MCG  tablet Take 75 mcg by mouth daily before breakfast.    . liothyronine (CYTOMEL) 5 MCG tablet Take 5 mcg by mouth daily.    Marland Kitchen MAGNESIUM PO Take 1 tablet by mouth at bedtime. 425 mg    . NON FORMULARY Vitality c    . omega-3 acid ethyl esters (LOVAZA) 1 g capsule Take 1 g by mouth daily.    . progesterone (PROMETRIUM) 200 MG capsule Take 200 mg by mouth daily. 10 day cycle every 3 months    . TURMERIC CURCUMIN PO Take 1 tablet by mouth 6 (six) times daily. curcumin    . diltiazem (CARDIZEM CD) 120 MG 24 hr capsule Take 1 capsule (120 mg total) by mouth daily. 30 capsule 3   No current facility-administered medications for this  encounter.     Allergies  Allergen Reactions  . Sulfa Antibiotics Other (See Comments)    unspecified    Social History   Social History  . Marital status: Married    Spouse name: N/A  . Number of children: N/A  . Years of education: N/A   Occupational History  . Not on file.   Social History Main Topics  . Smoking status: Never Smoker  . Smokeless tobacco: Never Used  . Alcohol use 8.4 oz/week    14 Glasses of wine per week     Comment: 1-2 glasses of wine daily  . Drug use: No  . Sexual activity: Not on file   Other Topics Concern  . Not on file   Social History Narrative  . No narrative on file    Family History  Problem Relation Age of Onset  . Anemia Mother   . Hyperlipidemia Mother   . Hypertension Mother   . Stroke Father   . Hyperlipidemia Father   . Cancer Father   . CVA Father   . Cancer Other   . Hypertension Other   . Hyperlipidemia Other   . Stroke Other   . Heart attack Maternal Grandfather   . Cancer Maternal Grandfather   . Cancer Paternal Grandfather   . Alcohol abuse Paternal Grandfather     ROS- All systems are reviewed and negative except as per the HPI above  Physical Exam: Vitals:   09/29/16 0848  BP: 118/76  Pulse: 62  Weight: 211 lb 9.6 oz (96 kg)  Height: 5\' 8"  (1.727 m)   Wt Readings from Last 3 Encounters:  09/29/16 211 lb 9.6 oz (96 kg)  03/16/15 229 lb 11.5 oz (104.2 kg)  03/05/15 208 lb 1.8 oz (94.4 kg)    Labs: Lab Results  Component Value Date   NA 141 09/26/2016   K 3.5 09/26/2016   CL 106 09/26/2016   CO2 28 09/26/2016   GLUCOSE 161 (H) 09/26/2016   BUN 12 09/26/2016   CREATININE 0.69 09/26/2016   CALCIUM 9.8 09/26/2016   MG 2.1 09/26/2016   No results found for: INR No results found for: CHOL, HDL, LDLCALC, TRIG   GEN- The patient is well appearing, alert and oriented x 3 today.   Head- normocephalic, atraumatic Eyes-  Sclera clear, conjunctiva pink Ears- hearing intact Oropharynx-  clear Neck- supple, no JVP Lymph- no cervical lymphadenopathy Lungs- Clear to ausculation bilaterally, normal work of breathing Heart- Regular rate and rhythm, no murmurs, rubs or gallops, PMI not laterally displaced GI- soft, NT, ND, + BS Extremities- no clubbing, cyanosis, or edema MS- no significant deformity or atrophy Skin- no rash or lesion Psych- euthymic mood,  full affect Neuro- strength and sensation are intact  EKG-NSR at 62 bpm, PR int 158 ms, qrs int 80 ms, qtc 391 ms Epic records reviewed EKG's from ER reviewed and showed afib at 162 bpm    Assessment and Plan: 1. Paroxysmal atrial fibrillatiion General education re afib and lifestyle issues that could could be acting as triggers Asked to decrease alcohol to no more than 2 drinks a week Decrease caffeine intake Will change Cardizem 60 mg bid to 120 mg daily for cost purposes She will continue xarelto for now with chadsvasc score of 2(female/HTN per Epic and pre diabetes/metabolic syndrome being possible score of 3), however, pt voices concerns being on anticoagulation long term Denies bleeding history Bleeding precautions reviewed Asked pt to stop tumeric due to possibly  Increasing bleeding risk, and to consider stopping fish oil for tendency for thinning the blood as well Will request Endocrinologist records  TSH today on replacenment and pt not having checked lately Echo  Sleep study recommended  F/u in 3-4 weeks in afib clinic and then pt would like referral to Dr. Elenor Legato C. Shandel Busic, [facility] [address] [place], [place] [address] [phone]

## 2016-09-29 NOTE — Telephone Encounter (Signed)
-----   Message from Juluis Mire, RN sent at 09/29/2016  9:38 AM EDT ----- Regarding: sleep study Pt needs sleep study for afib. Thanks!!  Happy Friday Stacy

## 2016-10-02 ENCOUNTER — Ambulatory Visit (HOSPITAL_COMMUNITY)
Admission: RE | Admit: 2016-10-02 | Discharge: 2016-10-02 | Disposition: A | Discharge status: Home / Self Care | Payer: BLUE CROSS/BLUE SHIELD | Source: Ambulatory Visit | Attending: Nurse Practitioner | Admitting: Nurse Practitioner

## 2016-10-02 DIAGNOSIS — I10 Essential (primary) hypertension: Secondary | ICD-10-CM | POA: Insufficient documentation

## 2016-10-02 DIAGNOSIS — I480 Paroxysmal atrial fibrillation: Secondary | ICD-10-CM | POA: Diagnosis not present

## 2016-10-02 DIAGNOSIS — E785 Hyperlipidemia, unspecified: Secondary | ICD-10-CM | POA: Insufficient documentation

## 2016-10-02 NOTE — Progress Notes (Signed)
  Echocardiogram 2D Echocardiogram has been performed.  Jannett Celestine 10/02/2016, 9:06 AM

## 2016-10-02 NOTE — Addendum Note (Signed)
Encounter addended by: Sherran Needs, NP on: 10/02/2016  8:53 AM<BR>    Actions taken: Sign clinical note

## 2016-10-04 NOTE — Telephone Encounter (Signed)
Informed patient of upcoming sleep study and patient understanding was verbalized. Patient understands her sleep study is scheduled for Sunday November 19 2016. Patient understands her sleep study will be done at [facility] sleep lab. Patient understands she will receive a sleep packet in a week or so. Patient understands to call if she does not receive the sleep packet in a timely manner. Patient agrees with treatment and thanked me for call.

## 2016-10-10 ENCOUNTER — Encounter: Payer: Self-pay | Admitting: Cardiology

## 2016-10-10 ENCOUNTER — Ambulatory Visit (INDEPENDENT_AMBULATORY_CARE_PROVIDER_SITE_OTHER): Payer: BLUE CROSS/BLUE SHIELD | Admitting: Cardiology

## 2016-10-10 DIAGNOSIS — I341 Nonrheumatic mitral (valve) prolapse: Secondary | ICD-10-CM

## 2016-10-10 DIAGNOSIS — I480 Paroxysmal atrial fibrillation: Secondary | ICD-10-CM

## 2016-10-10 MED ORDER — DILTIAZEM HCL ER COATED BEADS 120 MG PO CP24: 120.0000 mg | ORAL_CAPSULE | ORAL | 3 refills | Status: DC | PRN

## 2016-10-10 NOTE — Progress Notes (Signed)
Cardiology Office Note:    Date:  10/10/2016   ID:  Sarah Blackwell, DOB June 24, 1955, MRN [id number]  PCP:  Cari Caraway, MD  Cardiologist:  Candee Furbish, MD    Referring MD: Cari Caraway, MD     History of Present Illness:    Sarah Blackwell is a 61 y.o. female with a hx of Hypertension, hyperlipidemia, hypothyroidism with paroxysmal atrial fibrillation here for the evaluation of mitral valve prolapse, mild at the request of Cari Caraway.  She had an episode of atrial fibrillation on 09/26/16 at dinner with friends, glasses of wine. Lasted from 5 PM till 3 AM. Spontaneously converted in the emergency department. She was given Eliquis. Diltiazem. She thinks that she may have had episodes prior to this that were short-lived. She's had no syncopal. No chest pain. She has had some generalized fatigue. Since this diagnosis, she had seen Roderic Palau and was instructed to decrease her alcohol intake to perhaps 2 glasses one per week and to stop coffee.  She is a nonsmoker.  Father died of heart attack at age 71.  She may at times have some mild shortness of breath with activity. She has seen Dr. Michiel Sites for impaired glucose tolerance but has not been diagnosed with diabetes. She is also not been diagnosed in the past with hypertension.  Past Medical History:  Diagnosis Date  . Allergic rhinitis   . Anxiety   . Arthritis   . Cancer ([place])    skin cancer  . Complication of anesthesia    difficulty waking up after surgery as a child.   . Fluid retention   . Headache    migraines when she was younger  . Hyperlipidemia   . Hypertension    never has taken medications  . Hypothyroidism   . PCOS (polycystic ovarian syndrome)   . Perimenopausal   . PONV (postoperative nausea and vomiting)   . Pre-diabetes   . Rosacea   . Wears glasses     Past Surgical History:  Procedure Laterality Date  . COLONOSCOPY    . FERTILITY SURGERY    . MOHS SURGERY     x 2  . TONSILLECTOMY    .  TOTAL HIP ARTHROPLASTY Right 06/30/2014   Procedure: RIGHT TOTAL HIP ARTHROPLASTY ANTERIOR APPROACH;  Surgeon: Mcarthur Rossetti, MD;  Location: [place];  Service: Orthopedics;  Laterality: Right;  . TOTAL HIP ARTHROPLASTY Left 03/16/2015   Procedure: LEFT TOTAL HIP ARTHROPLASTY ANTERIOR APPROACH;  Surgeon: Mcarthur Rossetti, MD;  Location: [place];  Service: Orthopedics;  Laterality: Left;    Current Medications: Current Meds  Medication Sig  . 5-Hydroxytryptophan (5-HTP PO) Take 1 tablet by mouth at bedtime.  Marland Kitchen apixaban (ELIQUIS) 5 MG TABS tablet Take 1 tablet (5 mg total) by mouth 2 (two) times daily.  . Cholecalciferol (VITAMIN D3) 1000 units CAPS Take 2 capsules by mouth daily.  Marland Kitchen estradiol (VIVELLE-DOT) 0.0375 MG/24HR Place 1 patch onto the skin 2 (two) times a week. Sun and Wed  . Eszopiclone (ESZOPICLONE) 3 MG TABS Take 3 mg by mouth at bedtime as needed (sleep).   . fexofenadine (ALLEGRA) 30 MG tablet Take 30 mg by mouth daily as needed (allergies).  . fluticasone (FLONASE) 50 MCG/ACT nasal spray Place 1-2 sprays into both nostrils daily as needed for allergies.  Marland Kitchen ibuprofen (ADVIL,MOTRIN) 200 MG tablet Take 200 mg by mouth every 6 (six) hours as needed for moderate pain.  . L-TRYPTOPHAN PO Take 1 tablet by mouth at bedtime.  Marland Kitchen  levothyroxine (SYNTHROID, LEVOTHROID) 75 MCG tablet Take 75 mcg by mouth daily before breakfast.  . liothyronine (CYTOMEL) 5 MCG tablet Take 5 mcg by mouth daily.  Marland Kitchen MAGNESIUM PO Take 1 tablet by mouth at bedtime. 425 mg  . NON FORMULARY Vitality c  . omega-3 acid ethyl esters (LOVAZA) 1 g capsule Take 1 g by mouth daily.  . progesterone (PROMETRIUM) 200 MG capsule Take 200 mg by mouth daily. 10 day cycle every 3 months  . TURMERIC CURCUMIN PO Take 1 tablet by mouth 6 (six) times daily. curcumin  . UNABLE TO FIND Total restore patient takes one tablet by mouth 3 times a day.  Marland Kitchen UNABLE TO FIND Morning complete patient takes 1 scoop in 8 oz of water by  mouth daily. Prebiotics,probiotics, green super foods  . [DISCONTINUED] diltiazem (CARDIZEM SR) 60 MG 12 hr capsule Take 60 mg by mouth 2 (two) times daily.     Allergies:   Sulfa antibiotics   Social History   Social History  . Marital status: Married    Spouse name: N/A  . Number of children: N/A  . Years of education: N/A   Social History Main Topics  . Smoking status: Never Smoker  . Smokeless tobacco: Never Used  . Alcohol use 8.4 oz/week    14 Glasses of wine per week     Comment: 1-2 glasses of wine daily  . Drug use: No  . Sexual activity: Not on file   Other Topics Concern  . Not on file   Social History Narrative  . No narrative on file     Family History: The patient's family history includes Alcohol abuse in her paternal grandfather; Anemia in her mother; CVA in her father; Cancer in her father, maternal grandfather, other, and paternal grandfather; Heart attack in her maternal grandfather; Hyperlipidemia in her father, mother, and other; Hypertension in her mother and other; Stroke in her father and other. ROS:   Please see the history of present illness.     All other systems reviewed and are negative.  EKGs/Labs/Other Studies Reviewed:    The following studies were reviewed today:  ECHO 10/02/16: - Left ventricle: The cavity size was normal. Wall thickness was   normal. Systolic function was normal. The estimated ejection   fraction was in the range of 55% to 60%. Wall motion was normal;   there were no regional wall motion abnormalities. - Mitral valve: Mild prolapse, involving the anterior leaflet.   There was trivial regurgitation. - Left atrium: The atrium was normal in size.  EKG:  No new EKG today. Prior EKG atrial fibrillation  Recent Labs: 09/26/2016: BUN 12; Creatinine, Ser 0.69; Hemoglobin 14.5; Magnesium 2.1; Platelets 207; Potassium 3.5; Sodium 141 09/29/2016: TSH 1.244  Recent Lipid Panel No results found for: CHOL, TRIG, HDL, CHOLHDL,  VLDL, LDLCALC, LDLDIRECT  Physical Exam:    VS:  There were no vitals taken for this visit.    Wt Readings from Last 3 Encounters:  09/29/16 211 lb 9.6 oz (96 kg)  03/16/15 229 lb 11.5 oz (104.2 kg)  03/05/15 208 lb 1.8 oz (94.4 kg)     GEN:  Well nourished, well developed in no acute distress HEENT: Normal NECK: No JVD; No carotid bruits LYMPHATICS: No lymphadenopathy CARDIAC: RRR, no murmurs, rubs, gallops RESPIRATORY:  Clear to auscultation without rales, wheezing or rhonchi  ABDOMEN: Soft, non-tender, non-distended MUSCULOSKELETAL:  No edema; No deformity  SKIN: Warm and dry NEUROLOGIC:  Alert and oriented x  3 PSYCHIATRIC:  Normal affect   ASSESSMENT:    1. Paroxysmal atrial fibrillation (HCC)   2. Mitral valve prolapse    PLAN:    In order of problems listed above:  Paroxysmal atrial fibrillation -CHADSVASc is technically 0 as she has not been diagnosed with hypertension nor has she been diagnosed with diabetes. She is female obviously. No prior strokes. I think it would be reasonable for her to continue with the Eliquis for an entire month following her spontaneous conversion with diltiazem on 09/26/16. We had a lengthy discussion about stroke risk factors and atrial fibrillation. When she is 66, her score would increase to at least 2. If she does become diagnosed with diabetes, her score becomes 2 as well and she deserves full anticoagulation. -I do instruct her to try to limit her alcohol intake as this can be a trigger. Consider sleep study is snoring or sleep apnea becomes an issue. Weight loss and exercise will be helpful as well. Try to avoid excessive stimulants. -If atrial flutter ablation returns, she may take her diltiazem CD 120 mg on an as-needed basis. If after one hour it has not broken, she may take another 120 mg. If after several hours, 8 for instance, please seek medical attention.  She will let me know if she has any further recurrences.  Mitral valve  prolapse -Mild anterior leaflet. Trivial regurgitation. No need for any intervention.  Impaired glucose tolerance -Random glucose was 161 in emergency room. She was nonfasting. She has seen endocrinology, Dr. Suzette Battiest. She has not been officially diagnosed with diabetes. If this does occur, she does deserve anticoagulation.  We will see her back at least in one year.     Medication Adjustments/Labs and Tests Ordered: Current medicines are reviewed at length with the patient today.  Concerns regarding medicines are outlined above.  No orders of the defined types were placed in this encounter.  Meds ordered this encounter  Medications  . diltiazem (CARDIZEM CD) 120 MG 24 hr capsule    Sig: Take 1 capsule (120 mg total) by mouth as needed.    Dispense:  30 capsule    Refill:  3    Signed, Candee Furbish, MD  10/10/2016 4:08 PM    [facility]

## 2016-10-10 NOTE — Patient Instructions (Signed)
Medication Instructions:  You may take Diltiazem 120 mg as needed for At Fib. If you noticed At Fib take 1 capsule and wait for 1 hour prior to taking another.  If after the 2nd tablet you don't convert please call the office to discuss. You may discontinue the Eliquis after 10/26/16. Continue all other medications as listed.  Follow-Up: Follow up in 1 year with Dr. Marlou Porch.  You will receive a letter in the mail 2 months before you are due.  Please call us when you receive this letter to schedule your follow up appointment.  If you need a refill on your cardiac medications before your next appointment, please call your pharmacy.  Thank you for choosing [facility]!!

## 2016-10-16 ENCOUNTER — Inpatient Hospital Stay (HOSPITAL_COMMUNITY)
Admission: RE | Admit: 2016-10-16 | Payer: BLUE CROSS/BLUE SHIELD | Source: Ambulatory Visit | Admitting: Nurse Practitioner

## 2016-11-19 ENCOUNTER — Encounter (HOSPITAL_BASED_OUTPATIENT_CLINIC_OR_DEPARTMENT_OTHER): Payer: BLUE CROSS/BLUE SHIELD

## 2016-11-20 ENCOUNTER — Telehealth: Payer: Self-pay | Admitting: *Deleted

## 2016-11-20 NOTE — Telephone Encounter (Signed)
-----   Message from Almyra Free sent at 11/14/2016  8:37 AM EST ----- Regarding: FW: Sleep Study-Afib Gae Bon, patient does not qualify for in lab. Roderic Palau has approved switching to home study. Thanks, Amy ----- Message ----- From: Sherran Needs, NP Sent: 11/14/2016   8:18 AM To: Almyra Free Subject: RE: Sleep Study-Afib                           yes ----- Message ----- From: Almyra Free Sent: 11/13/2016   2:12 PM To: Sherran Needs, NP Subject: RE: Sleep Study-Afib                           Patient does not meet insurance criteria for in lab sleep study. Is it okay to switch to home study?  ----- Message ----- From: Sherran Needs, NP Sent: 11/13/2016   8:07 AM To: Almyra Free Subject: RE: Sleep Study-Afib                           Supraventricular tachycardia arrythmias ----- Message ----- From: Almyra Free Sent: 11/11/2016  11:18 AM To: Sherran Needs, NP Subject: Sleep Study-Afib                               Does this patient's afib fall under either of these?  Sustained supraventricular tachycardic arrhythmias or Sustained supraventricular bradycardic arrhythmias?  Thanks,  Amy

## 2016-11-20 NOTE — Telephone Encounter (Signed)
HOME STUDY APPROVED  Lula Olszewski, CMA        Nina,  Home study has been approved. You may go ahead and schedule.  Thanks,  Amy

## 2016-11-24 DIAGNOSIS — D1801 Hemangioma of skin and subcutaneous tissue: Secondary | ICD-10-CM | POA: Diagnosis not present

## 2016-11-24 DIAGNOSIS — D2262 Melanocytic nevi of left upper limb, including shoulder: Secondary | ICD-10-CM | POA: Diagnosis not present

## 2016-11-24 DIAGNOSIS — L820 Inflamed seborrheic keratosis: Secondary | ICD-10-CM | POA: Diagnosis not present

## 2016-11-24 DIAGNOSIS — D2261 Melanocytic nevi of right upper limb, including shoulder: Secondary | ICD-10-CM | POA: Diagnosis not present

## 2016-12-06 ENCOUNTER — Other Ambulatory Visit: Payer: Self-pay

## 2016-12-06 MED ORDER — DILTIAZEM HCL ER COATED BEADS 120 MG PO CP24: 120.0000 mg | ORAL_CAPSULE | ORAL | 5 refills | Status: DC | PRN

## 2016-12-19 ENCOUNTER — Other Ambulatory Visit (HOSPITAL_COMMUNITY): Payer: Self-pay | Admitting: *Deleted

## 2016-12-19 DIAGNOSIS — M25561 Pain in right knee: Secondary | ICD-10-CM | POA: Diagnosis not present

## 2016-12-19 DIAGNOSIS — M25572 Pain in left ankle and joints of left foot: Secondary | ICD-10-CM | POA: Diagnosis not present

## 2016-12-19 DIAGNOSIS — Z96643 Presence of artificial hip joint, bilateral: Secondary | ICD-10-CM | POA: Diagnosis not present

## 2016-12-19 MED ORDER — DILTIAZEM HCL ER COATED BEADS 120 MG PO CP24: 120.0000 mg | ORAL_CAPSULE | ORAL | 5 refills | Status: DC | PRN

## 2016-12-25 DIAGNOSIS — M25572 Pain in left ankle and joints of left foot: Secondary | ICD-10-CM | POA: Diagnosis not present

## 2016-12-25 DIAGNOSIS — Z96643 Presence of artificial hip joint, bilateral: Secondary | ICD-10-CM | POA: Diagnosis not present

## 2016-12-25 DIAGNOSIS — M25561 Pain in right knee: Secondary | ICD-10-CM | POA: Diagnosis not present

## 2017-01-04 ENCOUNTER — Other Ambulatory Visit: Payer: Self-pay | Admitting: Cardiology

## 2017-01-04 MED ORDER — DILTIAZEM HCL ER COATED BEADS 120 MG PO CP24: 120.0000 mg | ORAL_CAPSULE | ORAL | 2 refills | Status: DC | PRN

## 2017-01-05 NOTE — Telephone Encounter (Signed)
LMTCB on cell

## 2017-01-16 ENCOUNTER — Other Ambulatory Visit: Payer: Self-pay | Admitting: Cardiology

## 2017-01-16 MED ORDER — DILTIAZEM HCL ER COATED BEADS 120 MG PO CP24: 120.0000 mg | ORAL_CAPSULE | ORAL | 2 refills | Status: DC | PRN

## 2017-01-24 DIAGNOSIS — R05 Cough: Secondary | ICD-10-CM | POA: Diagnosis not present

## 2017-01-24 DIAGNOSIS — E782 Mixed hyperlipidemia: Secondary | ICD-10-CM | POA: Diagnosis not present

## 2017-01-24 DIAGNOSIS — Z0001 Encounter for general adult medical examination with abnormal findings: Secondary | ICD-10-CM | POA: Diagnosis not present

## 2017-01-24 DIAGNOSIS — E559 Vitamin D deficiency, unspecified: Secondary | ICD-10-CM | POA: Diagnosis not present

## 2017-01-24 DIAGNOSIS — E039 Hypothyroidism, unspecified: Secondary | ICD-10-CM | POA: Diagnosis not present

## 2017-01-24 DIAGNOSIS — Z23 Encounter for immunization: Secondary | ICD-10-CM | POA: Diagnosis not present

## 2017-01-24 DIAGNOSIS — R7301 Impaired fasting glucose: Secondary | ICD-10-CM | POA: Diagnosis not present

## 2017-03-28 ENCOUNTER — Telehealth: Payer: Self-pay | Admitting: Cardiology

## 2017-03-28 NOTE — Telephone Encounter (Signed)
As long as she remains CHADS-VASC of 0-1 (Female only) and she has not been Dx with diabetes since our last visit, aspirin or anticoagulation has not been shown to be of benefit.   I would consider however having her consider either an antiarrhythmic medication or perhaps have a discussion with EP about ablation. Given her increase in episodes, symptomatic, but paroxsymal she would be a good candidate for ablation. If she would like to discuss this further, we can give her a referral to EP.   Candee Furbish, MD

## 2017-03-28 NOTE — Telephone Encounter (Signed)
Spoke with pt who reports having 4 episodes of At Fib since being seen last in October.  She has been taking Diltiazem 120 mg daily as well as prn Dilt for the recurrent At Fib.  At times she has has to take 2, 1 hour apart and once used 60 mg tablets she had left over.  She is not on a anticoagulant.  She is questioning whether she should take ASA or fish oil at this point.  Advised I will review this information with Dr Marlou Porch and c/b with any new orders.  She states understanding and will await a c/b tomorrow.

## 2017-03-28 NOTE — Telephone Encounter (Signed)
New message    Patient has questions about afib and diltiazem (CARDIZEM CD) 120 MG 24 hr capsule. Patient states she feels good today, but wants advice on managing afib.

## 2017-03-29 DIAGNOSIS — E782 Mixed hyperlipidemia: Secondary | ICD-10-CM | POA: Diagnosis not present

## 2017-03-30 MED ORDER — DILTIAZEM HCL ER COATED BEADS 120 MG PO CP24
ORAL_CAPSULE | ORAL | 6 refills | Status: DC
Start: 2017-03-30 — End: 2017-11-14

## 2017-03-30 NOTE — Telephone Encounter (Signed)
Discussed Dr Marlou Porch recommendation to consider either antiarrhythmic medications or discuss with EP about possible ablation.  She will discuss with her husband and call back once she decides.  Refill sent into pharmacy as requested.

## 2017-04-05 NOTE — Telephone Encounter (Signed)
Per novasom: " We have exhausted all attempts to schedule the patient for Home Sleep Test. No response from patient."

## 2017-07-09 ENCOUNTER — Encounter: Payer: Self-pay | Admitting: Podiatry

## 2017-07-09 ENCOUNTER — Ambulatory Visit (INDEPENDENT_AMBULATORY_CARE_PROVIDER_SITE_OTHER): Payer: BLUE CROSS/BLUE SHIELD | Admitting: Podiatry

## 2017-07-09 ENCOUNTER — Ambulatory Visit (INDEPENDENT_AMBULATORY_CARE_PROVIDER_SITE_OTHER): Payer: BLUE CROSS/BLUE SHIELD

## 2017-07-09 VITALS — BP 130/81 | HR 73 | Resp 16

## 2017-07-09 DIAGNOSIS — S99922A Unspecified injury of left foot, initial encounter: Secondary | ICD-10-CM | POA: Diagnosis not present

## 2017-07-09 DIAGNOSIS — M779 Enthesopathy, unspecified: Secondary | ICD-10-CM | POA: Diagnosis not present

## 2017-07-09 MED ORDER — TRIAMCINOLONE ACETONIDE 10 MG/ML IJ SUSP
10.0000 mg | Freq: Once | INTRAMUSCULAR | Status: AC
  Administered 2017-07-09: 10 mg

## 2017-07-09 NOTE — Progress Notes (Signed)
   Subjective:    Patient ID: Sarah Blackwell, female    DOB: 1955/09/17, 62 y.o.   MRN: [id number]  HPI    Review of Systems  All other systems reviewed and are negative.      Objective:   Physical Exam        Assessment & Plan:

## 2017-07-09 NOTE — Patient Instructions (Signed)
Tendinitis Tendinitis is inflammation of a tendon. A tendon is a strong cord of tissue that connects muscle to bone. Tendinitis can affect any tendon, but it most commonly affects the shoulder tendon (rotator cuff), ankle tendon (Achilles tendon), elbow tendon (triceps tendon), or one of the tendons in the wrist. What are the causes? This condition may be caused by:  Overusing a tendon or muscle. This is common.  Age-related wear and tear.  Injury.  Inflammatory conditions, such as arthritis.  Certain medicines.  What increases the risk? This condition is more likely to develop in people who do activities that involve repetitive motions. What are the signs or symptoms? Symptoms of this condition may include:  Pain.  Tenderness.  Mild swelling.  How is this diagnosed? This condition is diagnosed with a physical exam. You may also have tests, such as:  Ultrasound. This uses sound waves to make an image of your affected area.  MRI.  How is this treated? This condition may be treated by resting, icing, applying pressure (compression), and raising (elevating) the area above the level of your heart. This is known as RICE therapy. Treatment may also include:  Medicines to help reduce inflammation or to help reduce pain.  Exercises or physical therapy to strengthen and stretch the tendon.  A brace or splint.  Surgery (rare).  Follow these instructions at home:  If you have a splint or brace:  Wear the splint or brace as told by your health care provider. Remove it only as told by your health care provider.  Loosen the splint or brace if your fingers or toes tingle, become numb, or turn cold and blue.  Do not take baths, swim, or use a hot tub until your health care provider approves. Ask your health care provider if you can take showers. You may only be allowed to take sponge baths for bathing.  Do not let your splint or brace get wet if it is not waterproof. ? If your  splint or brace is not waterproof, cover it with a watertight plastic bag when you take a bath or a shower.  Keep the splint or brace clean. Managing pain, stiffness, and swelling  If directed, apply ice to the affected area. ? Put ice in a plastic bag. ? Place a towel between your skin and the bag. ? Leave the ice on for 20 minutes, 2-3 times a day.  If directed, apply heat to the affected area as often as told by your health care provider. Use the heat source that your health care provider recommends, such as a moist heat pack or a heating pad. ? Place a towel between your skin and the heat source. ? Leave the heat on for 20-30 minutes. ? Remove the heat if your skin turns bright red. This is especially important if you are unable to feel pain, heat, or cold. You may have a greater risk of getting burned.  Move the fingers or toes of the affected limb often, if this applies. This can help to prevent stiffness and lessen swelling.  If directed, elevate the affected area above the level of your heart while you are sitting or lying down. Driving  Do not drive or operate heavy machinery while taking prescription pain medicine.  Ask your health care provider when it is safe to drive if you have a splint or brace on any part of your arm or leg. Activity  Return to your normal activities as told by your health care   provider. Ask your health care provider what activities are safe for you.  Rest the affected area as told by your health care provider.  Avoid using the affected area while you are experiencing symptoms of tendinitis.  Do exercises as told by your health care provider. General instructions  If you have a splint, do not put pressure on any part of the splint until it is fully hardened. This may take several hours.  Wear an elastic bandage or compression wrap only as told by your health care provider.  Take over-the-counter and prescription medicines only as told by your  health care provider.  Keep all follow-up visits as told by your health care provider. This is important. Contact a health care provider if:  Your symptoms do not improve.  You develop new, unexplained problems, such as numbness in your hands. This information is not intended to replace advice given to you by your health care provider. Make sure you discuss any questions you have with your health care provider. Document Released: 12/24/1999 Document Revised: 08/26/2015 Document Reviewed: 09/28/2014 Elsevier Interactive Patient Education  2018 [employer]vier Inc[employer]  

## 2017-07-10 NOTE — Progress Notes (Signed)
Subjective:   Patient ID: Sarah Blackwell, female   DOB: 62 y.o.   MRN: [id number]   HPI Patient presents stating that she has had trouble with her left foot for a long time and that recently it is been hurting more on the outside and she was doing lunges and started to exercise again recently.  She heard a crack and popping sound but is able to bear weight on her foot was just concerned.  Patient does not smoke likes to be active   Review of Systems  All other systems reviewed and are negative.       Objective:  Physical Exam  Constitutional: She appears well-developed and well-nourished.  Cardiovascular: Intact distal pulses.  Pulmonary/Chest: Effort normal.  Musculoskeletal: Normal range of motion.  Neurological: She is alert.  Skin: Skin is warm.  Nursing note and vitals reviewed.   Neurovascular status intact muscle strength is adequate range of motion was normal with patient found to have dorsal lateral pain of the left midfoot with inflammation fluid also across the central portion with no indications of fracture or acute bony process currently     Assessment:  Probability for tendinitis with low-grade arthritic process dorsal lateral left foot     Plan:  H&P x-rays reviewed and today careful tenderness injection administered 3 mg Kenalog 5 Milgram Xylocaine advised on heat ice therapy and applied brace to lift the arch.  Discussed long-term orthotics and will see this patient back again next several weeks  X-rays indicate there is indications of arthritis of the midfoot area left

## 2017-07-23 ENCOUNTER — Telehealth: Payer: Self-pay | Admitting: Cardiology

## 2017-07-23 DIAGNOSIS — M7989 Other specified soft tissue disorders: Secondary | ICD-10-CM

## 2017-07-23 DIAGNOSIS — M79605 Pain in left leg: Secondary | ICD-10-CM

## 2017-07-23 NOTE — Telephone Encounter (Signed)
Spoke with Dr. Marlou Porch and he said ok to order Venous doppler to r/o DVT.  Spoke with scheduler and scheduled pt to have doppler tomorrow morning at 0900.  Spoke with pt and made her aware of recommendations.  Pt agreeable to come tomorrow for doppler.  Directions to [facility] office given to pt.  Pt appreciative for call.

## 2017-07-23 NOTE — Telephone Encounter (Signed)
Pt [place] the mountains last [place] and while there states she stretched and felt like she got a "charlie horse" in the back of her left leg.  Since then she has had pain in the calf, very tender to touch.  Denies redness but states it does have some blue tint to the area.  Leg is swollen.  Denies heat to leg.  Pt went to get a massage today and therapist refused to work on the leg d/t concern for DVT and recommended that pt contact our office.  Pt states she also had a Cortisone injection in her left foot last week and didn't know if something with that could have caused any of this.  Advised I would send information to Dr. Marlou Porch for review and advisement.

## 2017-07-23 NOTE — Telephone Encounter (Signed)
Did not use this encounter

## 2017-07-24 ENCOUNTER — Ambulatory Visit (HOSPITAL_COMMUNITY)
Admission: RE | Admit: 2017-07-24 | Discharge: 2017-07-24 | Disposition: A | Discharge status: Home / Self Care | Payer: BLUE CROSS/BLUE SHIELD | Source: Ambulatory Visit | Attending: Cardiovascular Disease | Admitting: Cardiovascular Disease

## 2017-07-24 DIAGNOSIS — M7989 Other specified soft tissue disorders: Secondary | ICD-10-CM | POA: Diagnosis not present

## 2017-07-24 DIAGNOSIS — M79605 Pain in left leg: Secondary | ICD-10-CM | POA: Diagnosis not present

## 2017-07-30 ENCOUNTER — Ambulatory Visit: Payer: BLUE CROSS/BLUE SHIELD | Admitting: Podiatry

## 2017-08-02 ENCOUNTER — Ambulatory Visit: Payer: BLUE CROSS/BLUE SHIELD | Admitting: Podiatry

## 2017-08-02 ENCOUNTER — Ambulatory Visit (INDEPENDENT_AMBULATORY_CARE_PROVIDER_SITE_OTHER): Payer: BLUE CROSS/BLUE SHIELD | Admitting: Podiatry

## 2017-08-02 ENCOUNTER — Encounter: Payer: Self-pay | Admitting: Podiatry

## 2017-08-02 DIAGNOSIS — E039 Hypothyroidism, unspecified: Secondary | ICD-10-CM | POA: Diagnosis not present

## 2017-08-02 DIAGNOSIS — M779 Enthesopathy, unspecified: Secondary | ICD-10-CM

## 2017-08-02 DIAGNOSIS — N951 Menopausal and female climacteric states: Secondary | ICD-10-CM | POA: Diagnosis not present

## 2017-08-02 DIAGNOSIS — E782 Mixed hyperlipidemia: Secondary | ICD-10-CM | POA: Diagnosis not present

## 2017-08-02 DIAGNOSIS — R7303 Prediabetes: Secondary | ICD-10-CM | POA: Diagnosis not present

## 2017-08-02 MED ORDER — TRIAMCINOLONE ACETONIDE 10 MG/ML IJ SUSP
10.0000 mg | Freq: Once | INTRAMUSCULAR | Status: AC
  Administered 2017-08-02: 10 mg

## 2017-08-02 NOTE — Progress Notes (Signed)
Subjective:   Patient ID: Sarah Blackwell, female   DOB: 62 y.o.   MRN: [id number]   HPI Patient states that after we did the original procedure she did feel somewhat better for the first couple weeks but then at the beach she was on her foot a lot and it started to get sore.  Seems to hurt more in the ankle now than it does in the foot   ROS      Objective:  Physical Exam  Neurovascular status intact with inflammation which seems to be mostly emanating from the sinus tarsi with pain still noted dorsal lateral and into the midtarsal joint but not as extreme as it was prior     Assessment:  Possibility for sinus tarsitis.  With still strong possibility of arthritis of the midtarsal joint left     Plan:  Today I injected the sinus tarsi left 3 mg Kenalog 5 Milgram Xylocaine we will evaluate the results of this and decide what else may be appropriate.  Patient will be seen back to recheck again in the next 3 weeks and we are going to have to consider CT or MRI depending on response

## 2017-09-05 ENCOUNTER — Encounter: Payer: Self-pay | Admitting: Podiatry

## 2017-09-05 ENCOUNTER — Ambulatory Visit (INDEPENDENT_AMBULATORY_CARE_PROVIDER_SITE_OTHER): Payer: BLUE CROSS/BLUE SHIELD | Admitting: Podiatry

## 2017-09-05 DIAGNOSIS — M779 Enthesopathy, unspecified: Secondary | ICD-10-CM | POA: Diagnosis not present

## 2017-09-06 NOTE — Progress Notes (Signed)
Subjective:   Patient ID: Sarah Blackwell, female   DOB: 62 y.o.   MRN: [id number]   HPI Patient states her foot feels a lot better not 100% but quite a bit better than it was   ROS      Objective:  Physical Exam  Neurovascular status intact with patient's left foot feeling better with diminished discomfort sinus tarsi and dorsal foot     Assessment:  Doing well post treatment for chronic foot pain left     Plan:  I discussed the most likely will need to consider to do these treatments in the future but at this point working to try to hold off

## 2017-09-24 DIAGNOSIS — Z1231 Encounter for screening mammogram for malignant neoplasm of breast: Secondary | ICD-10-CM | POA: Diagnosis not present

## 2017-10-05 DIAGNOSIS — N951 Menopausal and female climacteric states: Secondary | ICD-10-CM | POA: Diagnosis not present

## 2017-10-05 DIAGNOSIS — E039 Hypothyroidism, unspecified: Secondary | ICD-10-CM | POA: Diagnosis not present

## 2017-10-05 DIAGNOSIS — M810 Age-related osteoporosis without current pathological fracture: Secondary | ICD-10-CM | POA: Diagnosis not present

## 2017-10-05 DIAGNOSIS — R5383 Other fatigue: Secondary | ICD-10-CM | POA: Diagnosis not present

## 2017-10-05 DIAGNOSIS — G4700 Insomnia, unspecified: Secondary | ICD-10-CM | POA: Diagnosis not present

## 2017-10-05 DIAGNOSIS — R413 Other amnesia: Secondary | ICD-10-CM | POA: Diagnosis not present

## 2017-11-14 ENCOUNTER — Other Ambulatory Visit: Payer: Self-pay | Admitting: Cardiology

## 2017-12-18 ENCOUNTER — Encounter: Payer: Self-pay | Admitting: Cardiology

## 2017-12-18 ENCOUNTER — Ambulatory Visit (INDEPENDENT_AMBULATORY_CARE_PROVIDER_SITE_OTHER): Payer: BLUE CROSS/BLUE SHIELD | Admitting: Cardiology

## 2017-12-18 VITALS — BP 128/80 | HR 70 | Ht 68.0 in | Wt 207.2 lb

## 2017-12-18 DIAGNOSIS — I341 Nonrheumatic mitral (valve) prolapse: Secondary | ICD-10-CM

## 2017-12-18 DIAGNOSIS — Z79899 Other long term (current) drug therapy: Secondary | ICD-10-CM | POA: Diagnosis not present

## 2017-12-18 DIAGNOSIS — I480 Paroxysmal atrial fibrillation: Secondary | ICD-10-CM | POA: Diagnosis not present

## 2017-12-18 MED ORDER — FLECAINIDE ACETATE 50 MG PO TABS: 50.0000 mg | ORAL_TABLET | Freq: Two times a day (BID) | ORAL | 6 refills | Status: DC

## 2017-12-18 NOTE — Patient Instructions (Signed)
Medication Instructions:  Please start Flecainide 50 mg twice a day. Continue all other medication as listed.  If you need a refill on your cardiac medications before your next appointment, please call your pharmacy.   Testing/Procedures: Your physician has requested that you have an exercise tolerance test. For further information please visit [email or web address]. Please also follow instruction sheet, as given.  Follow-Up: At [facility], you and your health needs are our priority.  As part of our continuing mission to provide you with exceptional heart care, we have created designated Provider Care Teams.  These Care Teams include your primary Cardiologist (physician) and Advanced Practice Providers (APPs -  Physician Assistants and Nurse Practitioners) who all work together to provide you with the care you need, when you need it. You will need a follow up appointment in 2 months with Truitt Merle and Dr Marlou Porch in 6 months.  Please call our office 2 months in advance to schedule this appointment.  You may see Candee Furbish, MD or one of the following Advanced Practice Providers on your designated Care Team:   Truitt Merle, NP Cecilie Kicks, NP . Kathyrn Drown, NP  Thank you for choosing [facility]!!

## 2017-12-18 NOTE — Progress Notes (Signed)
Cardiology Office Note:    Date:  12/18/2017   ID:  Sarah Blackwell, DOB 22-Mar-1955, MRN [id number]  PCP:  Cari Caraway, MD  Cardiologist:  Candee Furbish, MD    Referring MD: Cari Caraway, MD     History of Present Illness:    Sarah Blackwell is a 62 y.o. female with a hx of Hypertension, hyperlipidemia, hypothyroidism with paroxysmal atrial fibrillation here for the evaluation of mitral valve prolapse, mild at the request of Cari Caraway.  She had an episode of atrial fibrillation on 09/26/16 at dinner with friends, glasses of wine. Lasted from 5 PM till 3 AM. Spontaneously converted in the emergency department. She was given Eliquis. Diltiazem. She thinks that she may have had episodes prior to this that were short-lived. She's had no syncopal. No chest pain. She has had some generalized fatigue. Since this diagnosis, she had seen Roderic Palau and was instructed to decrease her alcohol intake to perhaps 2 glasses one per week and to stop coffee.  She is a nonsmoker.  Father died of heart attack at age 34.  She may at times have some mild shortness of breath with activity. She has seen Dr. Suzette Battiest for impaired glucose tolerance but has not been diagnosed with diabetes. She is also not been diagnosed in the past with hypertension.  12/18/17 - Episodeds wake her up at night and gone in AM. 3 cups of coffee, walk in [place] walk and went into AFIB. ETOH after party goes.   Past Medical History:  Diagnosis Date  . Allergic rhinitis   . Anxiety   . Arthritis   . Cancer ([place])    skin cancer  . Complication of anesthesia    difficulty waking up after surgery as a child.   . Fluid retention   . Headache    migraines when she was younger  . Hyperlipidemia   . Hypertension    never has taken medications  . Hypothyroidism   . PCOS (polycystic ovarian syndrome)   . Perimenopausal   . PONV (postoperative nausea and vomiting)   . Pre-diabetes   . Rosacea   . Wears glasses      Past Surgical History:  Procedure Laterality Date  . COLONOSCOPY    . FERTILITY SURGERY    . MOHS SURGERY     x 2  . TONSILLECTOMY    . TOTAL HIP ARTHROPLASTY Right 06/30/2014   Procedure: RIGHT TOTAL HIP ARTHROPLASTY ANTERIOR APPROACH;  Surgeon: Mcarthur Rossetti, MD;  Location: [place];  Service: Orthopedics;  Laterality: Right;  . TOTAL HIP ARTHROPLASTY Left 03/16/2015   Procedure: LEFT TOTAL HIP ARTHROPLASTY ANTERIOR APPROACH;  Surgeon: Mcarthur Rossetti, MD;  Location: [place];  Service: Orthopedics;  Laterality: Left;    Current Medications: Current Meds  Medication Sig  . Cholecalciferol (VITAMIN D3) 1000 units CAPS Take 2 capsules by mouth daily.  Marland Kitchen diltiazem (CARDIZEM CD) 120 MG 24 hr capsule TAKE ONCE DAILY AND AS NEEDED UP TO 2 PER DAY FOR EPISODES OF A FIB. Please make overdue appt with Dr. Marlou Porch. 1st attempt  . Eszopiclone (ESZOPICLONE) 3 MG TABS Take 3 mg by mouth at bedtime as needed (sleep).   . fexofenadine (ALLEGRA) 30 MG tablet Take 30 mg by mouth daily as needed (allergies).  . fluticasone (FLONASE) 50 MCG/ACT nasal spray Place 1-2 sprays into both nostrils daily as needed for allergies.  Marland Kitchen ibuprofen (ADVIL,MOTRIN) 200 MG tablet Take 200 mg by mouth every 6 (six) hours as needed for  moderate pain.  . L-TRYPTOPHAN PO Take 1 tablet by mouth at bedtime.  Marland Kitchen levothyroxine (SYNTHROID, LEVOTHROID) 75 MCG tablet Take 75 mcg by mouth daily before breakfast.  . liothyronine (CYTOMEL) 5 MCG tablet Take 5 mcg by mouth daily.  Salley Scarlet [facility]  Anti-Inflammatory Cream- Diclofenac 3%, Baclofen 2%, Lidocaine 2% Apply 1-2 grams to affected area 3-4 times daily Qty. 120 gm 3 refills  . omega-3 acid ethyl esters (LOVAZA) 1 g capsule Take 1 g by mouth daily.  . rosuvastatin (CRESTOR) 10 MG tablet Take 10 mg by mouth daily.  . TURMERIC CURCUMIN PO Take 1 tablet by mouth 6 (six) times daily. curcumin     Allergies:   Sulfa antibiotics   Social History    Socioeconomic History  . Marital status: Married    Spouse name: Not on file  . Number of children: Not on file  . Years of education: Not on file  . Highest education level: Not on file  Occupational History  . Not on file  Social Needs  . Financial resource strain: Not on file  . Food insecurity:    Worry: Not on file    Inability: Not on file  . Transportation needs:    Medical: Not on file    Non-medical: Not on file  Tobacco Use  . Smoking status: Never Smoker  . Smokeless tobacco: Never Used  Substance and Sexual Activity  . Alcohol use: Yes    Alcohol/week: 14.0 standard drinks    Types: 14 Glasses of wine per week    Comment: 1-2 glasses of wine daily  . Drug use: No  . Sexual activity: Not on file  Lifestyle  . Physical activity:    Days per week: Not on file    Minutes per session: Not on file  . Stress: Not on file  Relationships  . Social connections:    Talks on phone: Not on file    Gets together: Not on file    Attends religious service: Not on file    Active member of club or organization: Not on file    Attends meetings of clubs or organizations: Not on file    Relationship status: Not on file  Other Topics Concern  . Not on file  Social History Narrative  . Not on file     Family History: The patient's family history includes Alcohol abuse in her paternal grandfather; Anemia in her mother; CVA in her father; Cancer in her father, maternal grandfather, other, and paternal grandfather; Heart attack in her maternal grandfather; Hyperlipidemia in her father, mother, and other; Hypertension in her mother and other; Stroke in her father and other. ROS:   Please see the history of present illness.     All other systems reviewed and are negative.  EKGs/Labs/Other Studies Reviewed:    The following studies were reviewed today:  ECHO 10/02/16: - Left ventricle: The cavity size was normal. Wall thickness was   normal. Systolic function was normal.  The estimated ejection   fraction was in the range of 55% to 60%. Wall motion was normal;   there were no regional wall motion abnormalities. - Mitral valve: Mild prolapse, involving the anterior leaflet.   There was trivial regurgitation. - Left atrium: The atrium was normal in size.  EKG: 12/18/2017-sinus rhythm 70 no other significant abnormalities, QRS duration 84  Recent Labs: No results found for requested labs within last 8760 hours.  Recent Lipid Panel No results found for:  CHOL, TRIG, HDL, CHOLHDL, VLDL, LDLCALC, LDLDIRECT  Physical Exam:    VS:  BP 128/80   Pulse 70   Ht 5\' 8"  (1.727 m)   Wt 207 lb 3.2 oz (94 kg)   BMI 31.50 kg/m     Wt Readings from Last 3 Encounters:  12/18/17 207 lb 3.2 oz (94 kg)  09/29/16 211 lb 9.6 oz (96 kg)  03/16/15 229 lb 11.5 oz (104.2 kg)     GEN:  Well nourished, well developed in no acute distress HEENT: Normal NECK: No JVD; No carotid bruits LYMPHATICS: No lymphadenopathy CARDIAC: RRR, no murmurs, rubs, gallops RESPIRATORY:  Clear to auscultation without rales, wheezing or rhonchi  ABDOMEN: Soft, non-tender, non-distended MUSCULOSKELETAL:  No edema; No deformity  SKIN: Warm and dry NEUROLOGIC:  Alert and oriented x 3 PSYCHIATRIC:  Normal affect   ASSESSMENT:    1. Paroxysmal atrial fibrillation (HCC)   2. Mitral valve prolapse   3. High risk medication use    PLAN:    In order of problems listed above:  Paroxysmal atrial fibrillation -She is now having more and more episodes.  Caffeine, alcohol, lack of sleep can trigger.  Her brother has a trigger when drinking cold liquids. -I will start flecainide 50 mg twice a day.  Continue with diltiazem.  Check a treadmill test to ensure safety and no arrhythmias and no QRS widening. -Structurally her heart is normal.  No CAD. -If symptoms persist, we can increase to 100 mg twice a day. -Try to avoid triggers. -CHADSVASc is technically 0 as she has not been diagnosed with  hypertension nor has she been diagnosed with diabetes. She is female obviously. No prior strokes. -If atrial flutter ablation returns, she may take her diltiazem CD 120 mg on an as-needed basis. If after one hour it has not broken, she may take another 120 mg. If after several hours, 8 for instance, please seek medical attention.  She will let us know how she is doing.  She will let me know if she has any further recurrences.  Mitral valve prolapse -Mild anterior leaflet. Trivial regurgitation. No need for any intervention.  Once again reviewed with her.  Impaired glucose tolerance -Random glucose was 161 in emergency room. She was nonfasting. She has seen endocrinology, Dr. Suzette Battiest. She has not been officially diagnosed with diabetes.   Have her follow-up with Cecille Rubin in 2 months me, 6.     Medication Adjustments/Labs and Tests Ordered: Current medicines are reviewed at length with the patient today.  Concerns regarding medicines are outlined above.  Orders Placed This Encounter  Procedures  . EXERCISE TOLERANCE TEST (ETT)  . EKG 12-Lead   Meds ordered this encounter  Medications  . flecainide (TAMBOCOR) 50 MG tablet    Sig: Take 1 tablet (50 mg total) by mouth 2 (two) times daily.    Dispense:  60 tablet    Refill:  6    Signed, Candee Furbish, MD  12/18/2017 4:02 PM    [facility]

## 2017-12-27 ENCOUNTER — Ambulatory Visit (INDEPENDENT_AMBULATORY_CARE_PROVIDER_SITE_OTHER): Payer: BLUE CROSS/BLUE SHIELD

## 2017-12-27 DIAGNOSIS — I480 Paroxysmal atrial fibrillation: Secondary | ICD-10-CM

## 2017-12-27 DIAGNOSIS — Z79899 Other long term (current) drug therapy: Secondary | ICD-10-CM

## 2017-12-27 LAB — EXERCISE TOLERANCE TEST
CHL CUP MPHR: 160 {beats}/min
CHL CUP RESTING HR STRESS: 67 {beats}/min
CSEPEDS: 0 s
Estimated workload: 10.1 METS
Exercise duration (min): 9 min
Peak HR: 137 {beats}/min
Percent HR: 85 %
RPE: 15

## 2017-12-31 ENCOUNTER — Telehealth: Payer: Self-pay | Admitting: *Deleted

## 2017-12-31 NOTE — Telephone Encounter (Signed)
Called patient to review results of GXT.  Advised of results and no changes are needed in current treatment at this time.  She is aware to continue Flecainide 50 mg BID and that she can take Diltiazem prn for At Fib.  She is asking if Dr Marlou Porch will tell her what range she should keep her HR during exercise.  Advised I will review with him and call her back next week.

## 2018-01-01 NOTE — Telephone Encounter (Signed)
As a general rule, max heart rate is 220-age. (160). Try to exercise at 70-80% of this (112-128bpm).  Candee Furbish, MD

## 2018-01-03 NOTE — Telephone Encounter (Signed)
Called and left message of Dr Marlou Porch' recommendations. (ok per DPR)  Requested she c/b if any questions or concerns.

## 2018-02-07 ENCOUNTER — Encounter: Payer: Self-pay | Admitting: Nurse Practitioner

## 2018-02-15 DIAGNOSIS — E039 Hypothyroidism, unspecified: Secondary | ICD-10-CM | POA: Diagnosis not present

## 2018-02-15 DIAGNOSIS — R7303 Prediabetes: Secondary | ICD-10-CM | POA: Diagnosis not present

## 2018-02-15 DIAGNOSIS — E559 Vitamin D deficiency, unspecified: Secondary | ICD-10-CM | POA: Diagnosis not present

## 2018-02-15 DIAGNOSIS — R7301 Impaired fasting glucose: Secondary | ICD-10-CM | POA: Diagnosis not present

## 2018-02-18 DIAGNOSIS — Z0000 Encounter for general adult medical examination without abnormal findings: Secondary | ICD-10-CM | POA: Diagnosis not present

## 2018-02-18 DIAGNOSIS — E039 Hypothyroidism, unspecified: Secondary | ICD-10-CM | POA: Diagnosis not present

## 2018-02-18 DIAGNOSIS — R7303 Prediabetes: Secondary | ICD-10-CM | POA: Diagnosis not present

## 2018-02-18 DIAGNOSIS — E282 Polycystic ovarian syndrome: Secondary | ICD-10-CM | POA: Diagnosis not present

## 2018-02-27 ENCOUNTER — Ambulatory Visit (INDEPENDENT_AMBULATORY_CARE_PROVIDER_SITE_OTHER): Payer: BLUE CROSS/BLUE SHIELD | Admitting: Nurse Practitioner

## 2018-02-27 ENCOUNTER — Telehealth: Payer: Self-pay | Admitting: *Deleted

## 2018-02-27 ENCOUNTER — Encounter (INDEPENDENT_AMBULATORY_CARE_PROVIDER_SITE_OTHER): Payer: Self-pay

## 2018-02-27 ENCOUNTER — Encounter: Payer: Self-pay | Admitting: Nurse Practitioner

## 2018-02-27 VITALS — BP 110/70 | HR 71 | Ht 68.0 in | Wt 215.0 lb

## 2018-02-27 DIAGNOSIS — R0683 Snoring: Secondary | ICD-10-CM

## 2018-02-27 DIAGNOSIS — I480 Paroxysmal atrial fibrillation: Secondary | ICD-10-CM

## 2018-02-27 DIAGNOSIS — I4891 Unspecified atrial fibrillation: Secondary | ICD-10-CM

## 2018-02-27 NOTE — Patient Instructions (Addendum)
We will be checking the following labs today - NONE   Medication Instructions:    Continue with your current medicines.   Would stay off Tumeric   If you need a refill on your cardiac medications before your next appointment, please call your pharmacy.     Testing/Procedures To Be Arranged:  N/A  Follow-Up:  See Dr. Marlou Porch in June - has recall   At [facility], you and your health needs are our priority.  As part of our continuing mission to provide you with exceptional heart care, we have created designated Provider Care Teams.  These Care Teams include your primary Cardiologist (physician) and Advanced Practice Providers (APPs -  Physician Assistants and Nurse Practitioners) who all work together to provide you with the care you need, when you need it.  Special Instructions:  . Sleep study  Call the [place] office at [phone] if you have any questions, problems or concerns.

## 2018-02-27 NOTE — Progress Notes (Deleted)
CARDIOLOGY OFFICE NOTE  Date:  02/27/2018    [name] Date of Birth: October 22, 1955 Medical Record #[id number]  PCP:  Cari Caraway, MD  Cardiologist:  Marisa Cyphers    No chief complaint on file.   History of Present Illness: Sarah Blackwell is a 63 y.o. female who presents today for a 2 month check. Seen for Dr. Marlou Porch .  She has a history of HTN, HLD, hypothyroidism with paroxysmal atrial fibrillation here for the evaluation of mitral valve prolapse, mild at the request of Cari Caraway.  She had an episode of atrial fibrillation on 09/26/16 at dinner with friends, glasses of wine. Lasted from 5 PM till 3 AM. Spontaneously converted in the emergency department. She was given Eliquis. Diltiazem. She thinks that she may have had episodes prior to this that were short-lived. She's had no syncopal. No chest pain. She has had some generalized fatigue. Since this diagnosis, she had seen Roderic Palau and was instructed to decrease her alcohol intake to perhaps 2 glasses one per week and to stop coffee.  She is a nonsmoker.  Father died of heart attack at age 48.  She may at times have some mild shortness of breath with activity. She has seen Dr. Suzette Battiest for impaired glucose tolerance but has not been diagnosed with diabetes. She is also not been diagnosed in the past with hypertension.  12/18/17 - Episodeds wake her up at night and gone in AM. 3 cups of coffee, walk [place] walk and went into AFIB. ETOH after party goes.   Comes in today. Here with   Past Medical History:  Diagnosis Date  . Allergic rhinitis   . Anxiety   . Arthritis   . Cancer ([place])    skin cancer  . Complication of anesthesia    difficulty waking up after surgery as a child.   . Fluid retention   . Headache    migraines when she was younger  . Hyperlipidemia   . Hypertension    never has taken medications  . Hypothyroidism   . PCOS (polycystic ovarian syndrome)   . Perimenopausal     . PONV (postoperative nausea and vomiting)   . Pre-diabetes   . Rosacea   . Wears glasses     Past Surgical History:  Procedure Laterality Date  . COLONOSCOPY    . FERTILITY SURGERY    . MOHS SURGERY     x 2  . TONSILLECTOMY    . TOTAL HIP ARTHROPLASTY Right 06/30/2014   Procedure: RIGHT TOTAL HIP ARTHROPLASTY ANTERIOR APPROACH;  Surgeon: Mcarthur Rossetti, MD;  Location: [facility];  Service: Orthopedics;  Laterality: Right;  . TOTAL HIP ARTHROPLASTY Left 03/16/2015   Procedure: LEFT TOTAL HIP ARTHROPLASTY ANTERIOR APPROACH;  Surgeon: Mcarthur Rossetti, MD;  Location: [facility];  Service: Orthopedics;  Laterality: Left;     Medications: No outpatient medications have been marked as taking for the 02/27/18 encounter (Appointment) with Burtis Junes, NP.     Allergies: Allergies  Allergen Reactions  . Sulfa Antibiotics Other (See Comments)    unspecified    Social History: The patient  reports that she has never smoked. She has never used smokeless tobacco. She reports current alcohol use of about 14.0 standard drinks of alcohol per week. She reports that she does not use drugs.   Family History: The patient's ***family history includes Alcohol abuse in her paternal grandfather; Anemia in her mother; CVA in her father; Cancer in  her father, maternal grandfather, paternal grandfather, and another family member; Heart attack in her maternal grandfather; Hyperlipidemia in her father, mother, and another family member; Hypertension in her mother and another family member; Stroke in her father and another family member.   Review of Systems: Please see the history of present illness.   Otherwise, the review of systems is positive for {NONE DEFAULTED:18576::"none"}.   All other systems are reviewed and negative.   Physical Exam: VS:  There were no vitals taken for this visit. Marland Kitchen  BMI There is no height or weight on file to calculate BMI.  Wt Readings from Last 3 Encounters:   12/18/17 207 lb 3.2 oz (94 kg)  09/29/16 211 lb 9.6 oz (96 kg)  03/16/15 229 lb 11.5 oz (104.2 kg)    General: Pleasant. Well developed, well nourished and in no acute distress.   HEENT: Normal.  Neck: Supple, no JVD, carotid bruits, or masses noted.  Cardiac: ***Regular rate and rhythm. No murmurs, rubs, or gallops. No edema.  Respiratory:  Lungs are clear to auscultation bilaterally with normal work of breathing.  GI: Soft and nontender.  MS: No deformity or atrophy. Gait and ROM intact.  Skin: Warm and dry. Color is normal.  Neuro:  Strength and sensation are intact and no gross focal deficits noted.  Psych: Alert, appropriate and with normal affect.   LABORATORY DATA:  EKG:  EKG {ACTION; IS/IS IRC:[id number]} ordered today. This demonstrates ***.  Lab Results  Component Value Date   WBC 5.2 09/26/2016   HGB 14.5 09/26/2016   HCT 42.7 09/26/2016   PLT 207 09/26/2016   GLUCOSE 161 (H) 09/26/2016   NA 141 09/26/2016   K 3.5 09/26/2016   CL 106 09/26/2016   CREATININE 0.69 09/26/2016   BUN 12 09/26/2016   CO2 28 09/26/2016   TSH 1.244 09/29/2016       BNP (last 3 results) No results for input(s): BNP in the last 8760 hours.  ProBNP (last 3 results) No results for input(s): PROBNP in the last 8760 hours.   Other Studies Reviewed Today: ECHO 10/02/16: - Left ventricle: The cavity size was normal. Wall thickness was normal. Systolic function was normal. The estimated ejection fraction was in the range of 55% to 60%. Wall motion was normal; there were no regional wall motion abnormalities. - Mitral valve: Mild prolapse, involving the anterior leaflet. There was trivial regurgitation. - Left atrium: The atrium was normal in size.  Assessment/Plan: Paroxysmal atrial fibrillation -She is now having more and more episodes.  Caffeine, alcohol, lack of sleep can trigger.  Her brother has a trigger when drinking cold liquids. -I will start flecainide 50 mg  twice a day.  Continue with diltiazem.  Check a treadmill test to ensure safety and no arrhythmias and no QRS widening. -Structurally her heart is normal.  No CAD. -If symptoms persist, we can increase to 100 mg twice a day. -Try to avoid triggers. -CHADSVASc is technically 0 as she has not been diagnosed with hypertension nor has she been diagnosed with diabetes. She is female obviously. No prior strokes. -If atrial flutter ablation returns, she may take her diltiazem CD 120 mg on an as-needed basis. If after one hour it has not broken, she may take another 120 mg. If after several hours, 8 for instance, please seek medical attention.  She will let us know how she is doing.  She will let me know if she has any further recurrences.  Mitral valve prolapse -  Mild anterior leaflet. Trivial regurgitation. No need for any intervention.  Once again reviewed with her.  Impaired glucose tolerance -Random glucose was 161 in emergency room. She was nonfasting. She has seen endocrinology, Dr. Suzette Battiest. She has not been officially diagnosed with diabetes.   Have her follow-up with Cecille Rubin in 2 months me, 6.   Current medicines are reviewed with the patient today.  The patient does not have concerns regarding medicines other than what has been noted above.  The following changes have been made:  See above.  Labs/ tests ordered today include:   No orders of the defined types were placed in this encounter.    Disposition:   FU with *** in {gen number 6-81:[id number]} {Days to years:10300}.   Patient is agreeable to this plan and will call if any problems develop in the interim.   SignedTruitt Merle, NP  02/27/2018 7:12 AM  [facility] [address] [place], [place]  [address] Phone: [phone] Fax: [phone]

## 2018-02-27 NOTE — Telephone Encounter (Signed)
-----   Message from Tamsen Snider sent at 02/27/2018  3:54 PM EST ----- Sleep study  Snoring  shye doty [phone]  Thanks  danielle

## 2018-02-27 NOTE — Progress Notes (Signed)
CARDIOLOGY OFFICE NOTE  Date:  02/27/2018    [name] Date of Birth: Feb 21, 1955 Medical Record #[id number]  PCP:  Cari Caraway, MD  Cardiologist:  Marisa Cyphers    Chief Complaint  Patient presents with  . Atrial Fibrillation    2 month check. Seen for     History of Present Illness: Sarah Blackwell is a 63 y.o. female who presents today for a 2 month check. Seen for Dr. Marlou Porch .  She has a history of HTN, HLD, hypothyroidism, mild MVP and paroxysmal atrial fibrillation. She has been placed on Flecainide - had negative GXT in 12/2017. Alcohol and caffeine have been a noted factor for contributing to her AF. She has had glucose impairment - no actual DM. She was hypertensive with her GXT.   Comes in today. Here alone. She is doing well. Has only had one episode so far of AF since being on Flecainide. Notes her BP has been lower. She continues to struggle with weight loss. She does exercise. She feels like her diet is pretty good. She has daily alcohol use - more on the weekend. She does snore - feels like she is not refreshed when she wakes up and is tired thru out the day. No chest pain. She has some ortho limitations - has had prior hip surgeries.   Past Medical History:  Diagnosis Date  . Allergic rhinitis   . Anxiety   . Arthritis   . Cancer ([place])    skin cancer  . Complication of anesthesia    difficulty waking up after surgery as a child.   . Fluid retention   . Headache    migraines when she was younger  . Hyperlipidemia   . Hypertension    never has taken medications  . Hypothyroidism   . PCOS (polycystic ovarian syndrome)   . Perimenopausal   . PONV (postoperative nausea and vomiting)   . Pre-diabetes   . Rosacea   . Wears glasses     Past Surgical History:  Procedure Laterality Date  . COLONOSCOPY    . FERTILITY SURGERY    . MOHS SURGERY     x 2  . TONSILLECTOMY    . TOTAL HIP ARTHROPLASTY Right 06/30/2014   Procedure: RIGHT TOTAL HIP  ARTHROPLASTY ANTERIOR APPROACH;  Surgeon: Mcarthur Rossetti, MD;  Location: [facility];  Service: Orthopedics;  Laterality: Right;  . TOTAL HIP ARTHROPLASTY Left 03/16/2015   Procedure: LEFT TOTAL HIP ARTHROPLASTY ANTERIOR APPROACH;  Surgeon: Mcarthur Rossetti, MD;  Location: [facility];  Service: Orthopedics;  Laterality: Left;     Medications: Current Meds  Medication Sig  . Cholecalciferol (VITAMIN D3) 1000 units CAPS Take 2 capsules by mouth daily.  Marland Kitchen diltiazem (CARDIZEM CD) 120 MG 24 hr capsule TAKE ONCE DAILY AND AS NEEDED UP TO 2 PER DAY FOR EPISODES OF A FIB. Please make overdue appt with Dr. Marlou Porch. 1st attempt  . Eszopiclone (ESZOPICLONE) 3 MG TABS Take 3 mg by mouth at bedtime as needed (sleep).   . fexofenadine (ALLEGRA) 30 MG tablet Take 30 mg by mouth daily as needed (allergies).  . flecainide (TAMBOCOR) 50 MG tablet Take 1 tablet (50 mg total) by mouth 2 (two) times daily.  . fluticasone (FLONASE) 50 MCG/ACT nasal spray Place 1-2 sprays into both nostrils daily as needed for allergies.  Marland Kitchen ibuprofen (ADVIL,MOTRIN) 200 MG tablet Take 200 mg by mouth every 6 (six) hours as needed for moderate pain.  Mellody Drown  PO Take 1 tablet by mouth at bedtime.  Marland Kitchen levothyroxine (SYNTHROID, LEVOTHROID) 75 MCG tablet Take 75 mcg by mouth daily before breakfast.  . liothyronine (CYTOMEL) 5 MCG tablet Take 5 mcg by mouth daily.  . metFORMIN (GLUCOPHAGE-XR) 500 MG 24 hr tablet Take 500 mg by mouth daily with breakfast.   . NON FORMULARY [facility]  Anti-Inflammatory Cream- Diclofenac 3%, Baclofen 2%, Lidocaine 2% Apply 1-2 grams to affected area 3-4 times daily Qty. 120 gm 3 refills  . omega-3 acid ethyl esters (LOVAZA) 1 g capsule Take 1 g by mouth daily.  . rosuvastatin (CRESTOR) 10 MG tablet Take 10 mg by mouth daily.  . TURMERIC CURCUMIN PO Take 1 tablet by mouth 6 (six) times daily. curcumin     Allergies: Allergies  Allergen Reactions  . Sulfa Antibiotics Other (See  Comments)    unspecified    Social History: The patient  reports that she has never smoked. She has never used smokeless tobacco. She reports current alcohol use of about 14.0 standard drinks of alcohol per week. She reports that she does not use drugs.   Family History: The patient's family history includes Alcohol abuse in her paternal grandfather; Anemia in her mother; CVA in her father; Cancer in her father, maternal grandfather, paternal grandfather, and another family member; Heart attack in her maternal grandfather; Hyperlipidemia in her father, mother, and another family member; Hypertension in her mother and another family member; Stroke in her father and another family member.   Review of Systems: Please see the history of present illness.   Otherwise, the review of systems is positive for none.   All other systems are reviewed and negative.   Physical Exam: VS:  BP 110/70 (BP Location: Left Arm, Patient Position: Sitting, Cuff Size: Large)   Pulse 71   Ht 5\' 8"  (1.727 m)   Wt 215 lb (97.5 kg)   SpO2 97%   BMI 32.69 kg/m  .  BMI Body mass index is 32.69 kg/m.  Wt Readings from Last 3 Encounters:  02/27/18 215 lb (97.5 kg)  12/18/17 207 lb 3.2 oz (94 kg)  09/29/16 211 lb 9.6 oz (96 kg)    General: Pleasant. Obese. Alert and in no acute distress.   HEENT: Normal.  Neck: Supple, no JVD, carotid bruits, or masses noted.  Cardiac: Regular rate and rhythm. No murmurs, rubs, or gallops. No edema.  Respiratory:  Lungs are clear to auscultation bilaterally with normal work of breathing.  GI: Soft and nontender.  MS: No deformity or atrophy. Gait and ROM intact.  Skin: Warm and dry. Color is normal.  Neuro:  Strength and sensation are intact and no gross focal deficits noted.  Psych: Alert, appropriate and with normal affect.   LABORATORY DATA:  EKG:  EKG is not ordered today.  Lab Results  Component Value Date   WBC 5.2 09/26/2016   HGB 14.5 09/26/2016   HCT 42.7  09/26/2016   PLT 207 09/26/2016   GLUCOSE 161 (H) 09/26/2016   NA 141 09/26/2016   K 3.5 09/26/2016   CL 106 09/26/2016   CREATININE 0.69 09/26/2016   BUN 12 09/26/2016   CO2 28 09/26/2016   TSH 1.244 09/29/2016       BNP (last 3 results) No results for input(s): BNP in the last 8760 hours.  ProBNP (last 3 results) No results for input(s): PROBNP in the last 8760 hours.   Other Studies Reviewed Today:  GXT Study Highlights 12/2017  Blood pressure demonstrated a hypertensive response to exercise.  There was no ST segment deviation noted during stress.   1. Hypertensive BP response.  2. Normal exercise tolerance.  3. No evidence for ischemia by ST segment analysis.    Notes recorded by Jerline Pain, MD on 12/30/2017 at 9:25 AM EST No QRS widening on flecainide 50 mg twice a day. No adverse arrhythmias. Peak blood pressure 215/85 during exercise. Continue to treat hypertension. Candee Furbish, MD  ECHO 10/02/16: - Left ventricle: The cavity size was normal. Wall thickness was normal. Systolic function was normal. The estimated ejection fraction was in the range of 55% to 60%. Wall motion was normal; there were no regional wall motion abnormalities. - Mitral valve: Mild prolapse, involving the anterior leaflet. There was trivial regurgitation. - Left atrium: The atrium was normal in size.  Assessment/Plan:  1. PAF - triggered by caffeine, alcohol and lack of sleep - now on Flecainide. Had negative GXT. Normal heart structure and no CAD. Would recommend staying on current regimen. She is agreeable to sleep evaluation. Would not use Tumeric ( I have seen several patients with more AF when taking this). See back as planned. CHADSVASC is just one for gender. She does not have formal DM or HTN.   2. Mild MVP - Mild anterior leaflet. Trivial regurgitation. No need for any intervention.  3. Impaired glucose tolerance -Random glucose was 161 in emergency room.  She was nonfasting. She has seen endocrinology, Dr. Suzette Battiest. She has not been officially diagnosed with diabetes.  4. HTN - noted with her GXT only. BP now fine.   5. Obesity - my tips given. Reduction in alcohol is also recommended.   Current medicines are reviewed with the patient today.  The patient does not have concerns regarding medicines other than what has been noted above.  The following changes have been made:  See above.  Labs/ tests ordered today include:    Orders Placed This Encounter  Procedures  . Ambulatory referral to Sleep Studies     Disposition:   FU with Dr. Marlou Porch as planned in June.   Patient is agreeable to this plan and will call if any problems develop in the interim.   SignedTruitt Merle, NP  02/27/2018 3:54 PM  [facility] [address] [place], [place]  [address] Phone: [phone] Fax: [phone]

## 2018-02-28 ENCOUNTER — Telehealth: Payer: Self-pay | Admitting: *Deleted

## 2018-02-28 NOTE — Telephone Encounter (Signed)
  Lauralee Evener, CMA  Lourdes Kucharski, Edie denied in lab sleep study. Approved HST. Please order and schedule. Auth # [id number]. Valid dates 02/28/18 to 04/28/18.     ----- Message -----  From: Freada Bergeron, CMA  Sent: 02/27/2018  4:23 PM EST  To: [facility] Sleep Studies  Subject: precert

## 2018-02-28 NOTE — Telephone Encounter (Signed)
Staff message sent to [place] denied in lab sleep study. Approved HST. Please order and schedule. Auth # [id number]. Valid dates 02/28/18 to 04/28/27.

## 2018-02-28 NOTE — Telephone Encounter (Signed)
-----   Message from Freada Bergeron, [place] sent at 02/27/2018  4:23 PM EST ----- Regarding: precert  ----- Message ----- From: Tamsen Snider Sent: 02/27/2018   3:54 PM EST To: Freada Bergeron, CMA  Sleep study  Snoring  [name] [phone]  Thanks  danielle

## 2018-02-28 NOTE — Addendum Note (Signed)
Addended by: Freada Bergeron on: 02/28/2018 11:49 AM   Modules accepted: Orders

## 2018-03-07 NOTE — Telephone Encounter (Signed)
Patient is aware and agreeable to Home Sleep Study through [facility]. Patient is scheduled for 03/15/18 at 11 AM to pick up home sleep kit and meet with Respiratory therapist at [facility]. Patient is aware that if this appointment date and time does not work for them they should contact Artis Delay directly at [phone]. Patient is aware that a sleep packet will be sent from [facility] in week. Left detailed message on voicemail with date and time of HST and informed patient to call back to confirm or reschedule.

## 2018-03-10 ENCOUNTER — Other Ambulatory Visit: Payer: Self-pay | Admitting: Cardiology

## 2018-03-15 ENCOUNTER — Ambulatory Visit (HOSPITAL_BASED_OUTPATIENT_CLINIC_OR_DEPARTMENT_OTHER): Payer: BLUE CROSS/BLUE SHIELD | Attending: Cardiology | Admitting: Cardiology

## 2018-03-15 DIAGNOSIS — I4891 Unspecified atrial fibrillation: Secondary | ICD-10-CM | POA: Diagnosis not present

## 2018-03-15 DIAGNOSIS — R0683 Snoring: Secondary | ICD-10-CM | POA: Diagnosis not present

## 2018-03-15 DIAGNOSIS — G4733 Obstructive sleep apnea (adult) (pediatric): Secondary | ICD-10-CM | POA: Diagnosis not present

## 2018-03-27 DIAGNOSIS — L570 Actinic keratosis: Secondary | ICD-10-CM | POA: Diagnosis not present

## 2018-03-27 DIAGNOSIS — D2261 Melanocytic nevi of right upper limb, including shoulder: Secondary | ICD-10-CM | POA: Diagnosis not present

## 2018-03-27 DIAGNOSIS — L820 Inflamed seborrheic keratosis: Secondary | ICD-10-CM | POA: Diagnosis not present

## 2018-03-27 DIAGNOSIS — L821 Other seborrheic keratosis: Secondary | ICD-10-CM | POA: Diagnosis not present

## 2018-03-27 DIAGNOSIS — D225 Melanocytic nevi of trunk: Secondary | ICD-10-CM | POA: Diagnosis not present

## 2018-03-27 DIAGNOSIS — D2262 Melanocytic nevi of left upper limb, including shoulder: Secondary | ICD-10-CM | POA: Diagnosis not present

## 2018-04-01 ENCOUNTER — Telehealth: Payer: Self-pay | Admitting: *Deleted

## 2018-04-01 ENCOUNTER — Telehealth: Payer: Self-pay | Admitting: Cardiology

## 2018-04-01 DIAGNOSIS — G4733 Obstructive sleep apnea (adult) (pediatric): Secondary | ICD-10-CM

## 2018-04-01 NOTE — Telephone Encounter (Signed)
-----   Message from Sueanne Margarita, MD sent at 04/01/2018 12:34 PM EDT ----- Please let patient know that they have sleep apnea and recommend CPAP titration. Please place on call back list to set up for CPAP titration once sleep lab reopens from COVID-19

## 2018-04-01 NOTE — Telephone Encounter (Signed)
  Patient calling for sleep study results

## 2018-04-01 NOTE — Procedures (Signed)
   Patient Name: Sarah Blackwell, Sarah Blackwell Date: 03/15/2018 Gender: Female D.O.B: Oct 09, 1955 Age (years): 41 Referring Provider: Fransico Him MD, ABSM Height (inches): 41 Interpreting Physician: Fransico Him MD, ABSM Weight (lbs): 215 RPSGT: Jacolyn Reedy BMI: 33 MRN: [id number] Neck Size: 14.00  CLINICAL INFORMATION Sleep Study Type: HST  Indication for sleep study: Snoring  Epworth Sleepiness Score: 11  SLEEP STUDY TECHNIQUE A multi-channel overnight portable sleep study was performed. The channels recorded were: nasal airflow, thoracic respiratory movement, and oxygen saturation with a pulse oximetry. Snoring was also monitored.  MEDICATIONS Patient self administered medications include: N/A.  SLEEP ARCHITECTURE Patient was studied for 544.3 minutes. The sleep efficiency was 100.0 % and the patient was supine for 41.5%. The arousal index was 0.0 per hour.  RESPIRATORY PARAMETERS The overall AHI was 14.6 per hour, with a central apnea index of 0.0 per hour.  The oxygen nadir was 90% during sleep.  CARDIAC DATA Mean heart rate during sleep was 61.0 bpm.  IMPRESSIONS - Mild to moderate obstructive sleep apnea occurred during this study (AHI = 14.6/h). - No significant central sleep apnea occurred during this study (CAI = 0.0/h). - The patient had minimal or no oxygen desaturation during the study (Min O2 = 90%) - Patient snored 37.8% during the sleep.  DIAGNOSIS - Obstructive Sleep Apnea (327.23 [G47.33 ICD-10])  RECOMMENDATIONS - Therapeutic CPAP titration to determine optimal pressure required to alleviate sleep disordered breathing. - Positional therapy avoiding supine position during sleep. - Avoid alcohol, sedatives and other CNS depressants that may worsen sleep apnea and disrupt normal sleep architecture. - Sleep hygiene should be reviewed to assess factors that may improve sleep quality. - Weight management and regular exercise should be initiated or  continued.  [Electronically signed] 04/01/2018 12:31 PM  Fransico Him MD, ABSM Diplomate, American Board of Sleep Medicine

## 2018-04-01 NOTE — Telephone Encounter (Signed)
Informed patient of sleep study results and patient understanding was verbalized. Patient understands her sleep study showed they have sleep apnea and recommend CPAP titration. Will place her on call back list to set up for CPAP titration once sleep lab reopens from COVID-19. Pt is aware and agreeable to results.

## 2018-04-02 NOTE — Telephone Encounter (Signed)
Returned call:   Informed patient of sleep study results and patient understanding was verbalized. Patient understands her sleep study showed they have sleep apnea and recommend CPAP titration. Will place her on call back list to set up for CPAP titration once sleep lab reopens from COVID-19. Pt is aware and agreeable to results.

## 2018-04-19 NOTE — Addendum Note (Signed)
Addended by: Freada Bergeron on: 04/19/2018 10:16 AM   Modules accepted: Orders

## 2018-04-30 DIAGNOSIS — H0011 Chalazion right upper eyelid: Secondary | ICD-10-CM | POA: Diagnosis not present

## 2018-05-28 DIAGNOSIS — H04123 Dry eye syndrome of bilateral lacrimal glands: Secondary | ICD-10-CM | POA: Diagnosis not present

## 2018-05-28 DIAGNOSIS — H0102B Squamous blepharitis left eye, upper and lower eyelids: Secondary | ICD-10-CM | POA: Diagnosis not present

## 2018-05-28 DIAGNOSIS — H0102A Squamous blepharitis right eye, upper and lower eyelids: Secondary | ICD-10-CM | POA: Diagnosis not present

## 2018-05-28 DIAGNOSIS — H2513 Age-related nuclear cataract, bilateral: Secondary | ICD-10-CM | POA: Diagnosis not present

## 2018-06-09 ENCOUNTER — Other Ambulatory Visit: Payer: Self-pay | Admitting: Cardiology

## 2018-06-18 ENCOUNTER — Encounter (HOSPITAL_BASED_OUTPATIENT_CLINIC_OR_DEPARTMENT_OTHER): Payer: BLUE CROSS/BLUE SHIELD

## 2018-06-18 ENCOUNTER — Telehealth: Payer: Self-pay

## 2018-06-18 NOTE — Telephone Encounter (Signed)
YOUR CARDIOLOGY TEAM HAS ARRANGED FOR AN E-VISIT FOR YOUR APPOINTMENT - PLEASE REVIEW IMPORTANT INFORMATION BELOW SEVERAL DAYS PRIOR TO YOUR APPOINTMENT  Due to the recent COVID-19 pandemic, we are transitioning in-person office visits to tele-medicine visits in an effort to decrease unnecessary exposure to our patients, their families, and staff. These visits are billed to your insurance just like a normal visit is. We also encourage you to sign up for MyChart if you have not already done so. You will need a smartphone if possible. For patients that do not have this, we can still complete the visit using a regular telephone but do prefer a smartphone to enable video when possible. You may have a family member that lives with you that can help. If possible, we also ask that you have a blood pressure cuff and scale at home to measure your blood pressure, heart rate and weight prior to your scheduled appointment. Patients with clinical needs that need an in-person evaluation and testing will still be able to come to the office if absolutely necessary. If you have any questions, feel free to call our office.     YOUR PROVIDER WILL BE USING THE FOLLOWING PLATFORM TO COMPLETE YOUR VISIT: [email or web address]  . IF USING MYCHART - How to Download the MyChart App to Your SmartPhone   - If Apple, go to App Store and type in MyChart in the search bar and download the app. If Android, ask patient to go to Google Play Store and type in MyChart in the search bar and download the app. The app is free but as with any other app downloads, your phone may require you to verify saved payment information or Apple/Android password.  - You will need to then log into the app with your MyChart username and password, and select [facility] as your healthcare provider to link the account.  - When it is time for your visit, go to the MyChart app, find appointments, and click Begin Video Visit. Be sure to Select Allow for your device to  access the Microphone and Camera for your visit. You will then be connected, and your provider will be with you shortly.  **If you have any issues connecting or need assistance, please contact MyChart service desk [phone] ([phone])**  **If using a computer, in order to ensure the best quality for your visit, you will need to use either of the following Internet Browsers: Google Chrome or Microsoft Edge**  . IF USING DOXIMITY or [email or web address].ME - The staff will give you instructions on receiving your link to join the meeting the day of your visit.      2-3 DAYS BEFORE YOUR APPOINTMENT  You will receive a telephone call from one of our [facility] team members - your caller ID may say "Unknown caller." If this is a video visit, we will walk you through how to get the video launched on your phone. We will remind you check your blood pressure, heart rate and weight prior to your scheduled appointment. If you have an Apple Watch or Kardia, please upload any pertinent ECG strips the day before or morning of your appointment to MyChart. Our staff will also make sure you have reviewed the consent and agree to move forward with your scheduled tele-health visit.     THE DAY OF YOUR APPOINTMENT  Approximately 15 minutes prior to your scheduled appointment, you will receive a telephone call from one of HeartCare team - your caller ID may say "Unknown caller."    Our staff will confirm medications, vital signs for the day and any symptoms you may be experiencing. Please have this information available prior to the time of visit start. It may also be helpful for you to have a pad of paper and pen handy for any instructions given during your visit. They will also walk you through joining the smartphone meeting if this is a video visit.    CONSENT FOR TELE-HEALTH VISIT - PLEASE REVIEW  I hereby voluntarily request, consent and authorize [facility] and its employed or contracted physicians, physician  assistants, nurse practitioners or other licensed health care professionals (the Practitioner), to provide me with telemedicine health care services (the "Services") as deemed necessary by the treating Practitioner. I acknowledge and consent to receive the Services by the Practitioner via telemedicine. I understand that the telemedicine visit will involve communicating with the Practitioner through live audiovisual communication technology and the disclosure of certain medical information by electronic transmission. I acknowledge that I have been given the opportunity to request an in-person assessment or other available alternative prior to the telemedicine visit and am voluntarily participating in the telemedicine visit.  I understand that I have the right to withhold or withdraw my consent to the use of telemedicine in the course of my care at any time, without affecting my right to future care or treatment, and that the Practitioner or I may terminate the telemedicine visit at any time. I understand that I have the right to inspect all information obtained and/or recorded in the course of the telemedicine visit and may receive copies of available information for a reasonable fee.  I understand that some of the potential risks of receiving the Services via telemedicine include:  . Delay or interruption in medical evaluation due to technological equipment failure or disruption; . Information transmitted may not be sufficient (e.g. poor resolution of images) to allow for appropriate medical decision making by the Practitioner; and/or  . In rare instances, security protocols could fail, causing a breach of personal health information.  Furthermore, I acknowledge that it is my responsibility to provide information about my medical history, conditions and care that is complete and accurate to the best of my ability. I acknowledge that Practitioner's advice, recommendations, and/or decision may be based on  factors not within their control, such as incomplete or inaccurate data provided by me or distortions of diagnostic images or specimens that may result from electronic transmissions. I understand that the practice of medicine is not an exact science and that Practitioner makes no warranties or guarantees regarding treatment outcomes. I acknowledge that I will receive a copy of this consent concurrently upon execution via email to the email address I last provided but may also request a printed copy by calling the office of [facility].    I understand that my insurance will be billed for this visit.   I have read or had this consent read to me. . I understand the contents of this consent, which adequately explains the benefits and risks of the Services being provided via telemedicine.  . I have been provided ample opportunity to ask questions regarding this consent and the Services and have had my questions answered to my satisfaction. . I give my informed consent for the services to be provided through the use of telemedicine in my medical care  By participating in this telemedicine visit I agree to the above.  

## 2018-06-19 ENCOUNTER — Ambulatory Visit: Payer: BLUE CROSS/BLUE SHIELD | Admitting: Cardiology

## 2018-06-28 ENCOUNTER — Encounter: Payer: Self-pay | Admitting: Cardiology

## 2018-06-28 ENCOUNTER — Other Ambulatory Visit: Payer: Self-pay

## 2018-06-28 ENCOUNTER — Telehealth (INDEPENDENT_AMBULATORY_CARE_PROVIDER_SITE_OTHER): Payer: BC Managed Care – PPO | Admitting: Cardiology

## 2018-06-28 VITALS — BP 128/89 | HR 70 | Ht 68.0 in

## 2018-06-28 DIAGNOSIS — I10 Essential (primary) hypertension: Secondary | ICD-10-CM | POA: Diagnosis not present

## 2018-06-28 DIAGNOSIS — G4733 Obstructive sleep apnea (adult) (pediatric): Secondary | ICD-10-CM | POA: Diagnosis not present

## 2018-06-28 DIAGNOSIS — I480 Paroxysmal atrial fibrillation: Secondary | ICD-10-CM

## 2018-06-28 NOTE — Progress Notes (Signed)
Virtual Visit via Video Note   This visit type was conducted due to national recommendations for restrictions regarding the COVID-19 Pandemic (e.g. social distancing) in an effort to limit this patient's exposure and mitigate transmission in our community.  Due to her co-morbid illnesses, this patient is at least at moderate risk for complications without adequate follow up.  This format is felt to be most appropriate for this patient at this time.  All issues noted in this document were discussed and addressed.  A limited physical exam was performed with this format.  Please refer to the patient's chart for her consent to telehealth for [facility].   Date:  06/28/2018   ID:  Sarah Blackwell, DOB 10-11-1955, MRN [id number]  Patient Location: Home Provider Location: Home  PCP:  Cari Caraway, MD  Cardiologist:  Candee Furbish, MD  Electrophysiologist:  None   Evaluation Performed:  Follow-Up Visit  Chief Complaint: Atrial fibrillation  History of Present Illness:    Sarah Blackwell is a 63 y.o. female with dismal atrial fibrillation on flecainide doing well.  OSA detected on sleep study.  Overall she has been doing quite well.  No palpitations no fevers chills nausea vomiting syncope bleeding.  Several questions answered.  The patient does not have symptoms concerning for COVID-19 infection (fever, chills, cough, or new shortness of breath).    Past Medical History:  Diagnosis Date  . Allergic rhinitis   . Anxiety   . Arthritis   . Cancer ([place])    skin cancer  . Complication of anesthesia    difficulty waking up after surgery as a child.   . Fluid retention   . Headache    migraines when she was younger  . Hyperlipidemia   . Hypertension    never has taken medications  . Hypothyroidism   . PCOS (polycystic ovarian syndrome)   . Perimenopausal   . PONV (postoperative nausea and vomiting)   . Pre-diabetes   . Rosacea   . Wears glasses    Past Surgical History:   Procedure Laterality Date  . COLONOSCOPY    . FERTILITY SURGERY    . MOHS SURGERY     x 2  . TONSILLECTOMY    . TOTAL HIP ARTHROPLASTY Right 06/30/2014   Procedure: RIGHT TOTAL HIP ARTHROPLASTY ANTERIOR APPROACH;  Surgeon: Mcarthur Rossetti, MD;  Location: [facility];  Service: Orthopedics;  Laterality: Right;  . TOTAL HIP ARTHROPLASTY Left 03/16/2015   Procedure: LEFT TOTAL HIP ARTHROPLASTY ANTERIOR APPROACH;  Surgeon: Mcarthur Rossetti, MD;  Location: [facility];  Service: Orthopedics;  Laterality: Left;     Current Meds  Medication Sig  . amoxicillin (AMOXIL) 500 MG capsule Take 4 capsules by mouth as needed.  . Cholecalciferol (VITAMIN D3 PO) Take 5,000 Units by mouth daily.  Marland Kitchen diltiazem (CARDIZEM CD) 120 MG 24 hr capsule TAKE ONCE DAILY AND AS NEEDED UP TO 2 PER DAY FOR EPISODES OF AFIB. (Patient taking differently: Take 120 mg by mouth daily. )  . Eszopiclone (ESZOPICLONE) 3 MG TABS Take 3 mg by mouth at bedtime as needed (sleep).   . flecainide (TAMBOCOR) 50 MG tablet Take 1 tablet (50 mg total) by mouth 2 (two) times daily.  . fluticasone (FLONASE) 50 MCG/ACT nasal spray Place 1-2 sprays into both nostrils daily as needed for allergies.  Marland Kitchen ibuprofen (ADVIL,MOTRIN) 200 MG tablet Take 200 mg by mouth every 6 (six) hours as needed for moderate pain.  . L-TRYPTOPHAN PO Take 1 tablet by  mouth at bedtime.  Marland Kitchen levothyroxine (SYNTHROID, LEVOTHROID) 75 MCG tablet Take 75 mcg by mouth daily before breakfast.  . liothyronine (CYTOMEL) 5 MCG tablet Take 5 mcg by mouth daily.  . metFORMIN (GLUCOPHAGE-XR) 500 MG 24 hr tablet Take 500 mg by mouth at bedtime.   Salley Scarlet [facility]  Anti-Inflammatory Cream- Diclofenac 3%, Baclofen 2%, Lidocaine 2% Apply 1-2 grams to affected area 3-4 times daily Qty. 120 gm 3 refills  . omega-3 acid ethyl esters (LOVAZA) 1 g capsule Take 1 g by mouth daily.  . rosuvastatin (CRESTOR) 10 MG tablet Take 10 mg by mouth daily.     Allergies:   Sulfa  antibiotics   Social History   Tobacco Use  . Smoking status: Never Smoker  . Smokeless tobacco: Never Used  Substance Use Topics  . Alcohol use: Yes    Alcohol/week: 14.0 standard drinks    Types: 14 Glasses of wine per week    Comment: 1-2 glasses of wine daily  . Drug use: No     Family Hx: The patient's family history includes Alcohol abuse in her paternal grandfather; Anemia in her mother; CVA in her father; Cancer in her father, maternal grandfather, paternal grandfather, and another family member; Heart attack in her maternal grandfather; Hyperlipidemia in her father, mother, and another family member; Hypertension in her mother and another family member; Stroke in her father and another family member.  ROS:   Please see the history of present illness.     All other systems reviewed and are negative.   Prior CV studies:   The following studies were reviewed today:  ECHO 10/02/16: - Left ventricle: The cavity size was normal. Wall thickness was normal. Systolic function was normal. The estimated ejection fraction was in the range of 55% to 60%. Wall motion was normal; there were no regional wall motion abnormalities. - Mitral valve: Mild prolapse, involving the anterior leaflet. There was trivial regurgitation. - Left atrium: The atrium was normal in size.  Labs/Other Tests and Data Reviewed:    EKG: Most recent ECG sinus rhythm  Recent Labs: No results found for requested labs within last 8760 hours.   Recent Lipid Panel No results found for: CHOL, TRIG, HDL, CHOLHDL, LDLCALC, LDLDIRECT  Wt Readings from Last 3 Encounters:  02/27/18 215 lb (97.5 kg)  12/18/17 207 lb 3.2 oz (94 kg)  09/29/16 211 lb 9.6 oz (96 kg)     Objective:    Vital Signs:  BP 128/89   Pulse 70   Ht 5\' 8"  (1.727 m)   BMI 32.69 kg/m    VITAL SIGNS:  reviewed GEN:  no acute distress EYES:  sclerae anicteric, EOMI - Extraocular Movements Intact RESPIRATORY:  normal  respiratory effort, symmetric expansion SKIN:  no rash, lesions or ulcers. MUSCULOSKELETAL:  no obvious deformities. NEURO:  alert and oriented x 3, no obvious focal deficit PSYCH:  normal affect  ASSESSMENT & PLAN:    PAF  -Doing very well with low-dose flecainide and diltiazem.  She did ask me questions about long-term use of the medication and potential deleterious effects long-term.  I reassured her there is no data confirming this.  I also stated that if she wanted to talk to our ablation specialists and EP we could always do this.  Obstructive sleep apnea - Dr. Theodosia Blender visit has been postponed.  She will continue to reach out.  She does not have a mask at this time.  Essential hypertension -Doing very well.  It was noted only with her GXT.  Obesity - BMI 32.  Continue with weight loss.  Mild mitral valve prolapse -Anterior leaflet trivial regurgitation.  Continue to monitor.  COVID-19 Education: The signs and symptoms of COVID-19 were discussed with the patient and how to seek care for testing (follow up with PCP or arrange E-visit).  The importance of social distancing was discussed today.  Time:   Today, I have spent 15 minutes with the patient with telehealth technology discussing the above problems.     Medication Adjustments/Labs and Tests Ordered: Current medicines are reviewed at length with the patient today.  Concerns regarding medicines are outlined above.   Tests Ordered: No orders of the defined types were placed in this encounter.   Medication Changes: No orders of the defined types were placed in this encounter.   Follow Up:  Virtual Visit or In Person in 6 month(s)  Signed, Candee Furbish, MD  06/28/2018 11:15 AM    [place]

## 2018-06-28 NOTE — Patient Instructions (Signed)
Medication Instructions:  The current medical regimen is effective;  continue present plan and medications.  If you need a refill on your cardiac medications before your next appointment, please call your pharmacy.   Follow-Up: At [facility], you and your health needs are our priority.  As part of our continuing mission to provide you with exceptional heart care, we have created designated Provider Care Teams.  These Care Teams include your primary Cardiologist (physician) and Advanced Practice Providers (APPs -  Physician Assistants and Nurse Practitioners) who all work together to provide you with the care you need, when you need it. You will need a follow up appointment in 6 months  Truitt Merle, NP.  Please call our office 2 months in advance to schedule this appointment.  You may see Candee Furbish, MD or one of the following Advanced Practice Providers on your designated Care Team:   Truitt Merle, NP Cecilie Kicks, NP . Kathyrn Drown, NP  Thank you for choosing [facility]!!

## 2018-07-07 ENCOUNTER — Other Ambulatory Visit: Payer: Self-pay | Admitting: Cardiology

## 2018-07-25 DIAGNOSIS — H5711 Ocular pain, right eye: Secondary | ICD-10-CM | POA: Diagnosis not present

## 2018-07-25 DIAGNOSIS — H5789 Other specified disorders of eye and adnexa: Secondary | ICD-10-CM | POA: Diagnosis not present

## 2018-08-22 DIAGNOSIS — E039 Hypothyroidism, unspecified: Secondary | ICD-10-CM | POA: Diagnosis not present

## 2018-08-22 DIAGNOSIS — R7303 Prediabetes: Secondary | ICD-10-CM | POA: Diagnosis not present

## 2018-08-22 DIAGNOSIS — E782 Mixed hyperlipidemia: Secondary | ICD-10-CM | POA: Diagnosis not present

## 2018-08-29 DIAGNOSIS — E039 Hypothyroidism, unspecified: Secondary | ICD-10-CM | POA: Diagnosis not present

## 2018-08-29 DIAGNOSIS — E782 Mixed hyperlipidemia: Secondary | ICD-10-CM | POA: Diagnosis not present

## 2018-08-29 DIAGNOSIS — E559 Vitamin D deficiency, unspecified: Secondary | ICD-10-CM | POA: Diagnosis not present

## 2018-08-29 DIAGNOSIS — R945 Abnormal results of liver function studies: Secondary | ICD-10-CM | POA: Diagnosis not present

## 2018-09-27 DIAGNOSIS — E039 Hypothyroidism, unspecified: Secondary | ICD-10-CM | POA: Diagnosis not present

## 2018-09-27 DIAGNOSIS — Z23 Encounter for immunization: Secondary | ICD-10-CM | POA: Diagnosis not present

## 2018-09-27 DIAGNOSIS — R945 Abnormal results of liver function studies: Secondary | ICD-10-CM | POA: Diagnosis not present

## 2018-10-04 ENCOUNTER — Encounter: Payer: Self-pay | Admitting: Family

## 2018-10-04 ENCOUNTER — Ambulatory Visit (INDEPENDENT_AMBULATORY_CARE_PROVIDER_SITE_OTHER): Payer: BC Managed Care – PPO | Admitting: Family

## 2018-10-04 ENCOUNTER — Ambulatory Visit: Payer: Self-pay

## 2018-10-04 VITALS — Ht 68.0 in | Wt 215.0 lb

## 2018-10-04 DIAGNOSIS — M150 Primary generalized (osteo)arthritis: Secondary | ICD-10-CM | POA: Diagnosis not present

## 2018-10-04 DIAGNOSIS — M25511 Pain in right shoulder: Secondary | ICD-10-CM

## 2018-10-04 DIAGNOSIS — M159 Polyosteoarthritis, unspecified: Secondary | ICD-10-CM

## 2018-10-07 ENCOUNTER — Telehealth: Payer: Self-pay | Admitting: Nurse Practitioner

## 2018-10-07 NOTE — Telephone Encounter (Signed)
° °  Patient calling with questions regarding sleep study. She would like to know what the next step is in care

## 2018-10-08 ENCOUNTER — Telehealth: Payer: Self-pay | Admitting: *Deleted

## 2018-10-08 NOTE — Telephone Encounter (Signed)
Patient called back to ask why she had to go to the lab for a titration when she had a HST in the begining and I informed her that the titration has to be done in lab because a Therapeutic CPAP titration is ordered to determine optimal pressure required to alleviate her sleep disordered breathing. Pt was agreeable to treatment.

## 2018-10-08 NOTE — Telephone Encounter (Signed)
Ok to order ResMed CPAP on auto from 4 to 18cm H2O with heated humiidty and mask of choice with download in 2 weeks.  Followup with me virtual in 10 weeks

## 2018-10-08 NOTE — Telephone Encounter (Signed)
[employer] has denied the cpap titration and has approved an APAP. Please send orders please.

## 2018-10-08 NOTE — Telephone Encounter (Signed)
Returned the patients call. [email or web address] 

## 2018-10-08 NOTE — Telephone Encounter (Signed)
Faxed [employer] APAP auth to Gae Bon to send with orders to [facility]. CPAP titration was denied.

## 2018-10-08 NOTE — Progress Notes (Signed)
Office Visit Note   Patient: Sarah Blackwell           Date of Birth: 12-12-1955           MRN: [id number] Visit Date: 10/04/2018              Requested by: Cari Caraway, [place],  [place] [id number] PCP: Cari Caraway, MD  Chief Complaint  Patient presents with  . Right Shoulder - Pain      HPI: The patient is a 63 year old woman who presents today complaining of right collarbone pain as well as some shoulder pain denies any neck pain.  This is been going on intermittently for the last 4 weeks.  She has been doing Pilates but has not been doing any high-impact exercise and cannot recall any injury she is tried massaging the area without any help she is tried anti-inflammatories with minimal relief.  Does have some pain raising her shoulder above her head some anterior shoulder and bicep pain but the primary area of her concern is she is pointing just to the right of her sternum to her collarbone.  Assessment & Plan: Visit Diagnoses:  1. Pain of right sternoclavicular joint   2. Acute pain of right shoulder   3. Primary osteoarthritis involving multiple joints     Plan: Discussed osteoarthritis at length.  Feel that she is likely having some sternoclavicular arthritis.  She will continue with conservative measures encouraged naproxen twice daily x2 weeks.  Have provided some scapular strengthening as well as just the rotator cuff strengthening handout she will follow-up in the office as needed.  Discussed that this could possibly be injected if she is unable to get relief.  Follow-Up Instructions: Return in about 3 weeks (around 10/25/2018), or if symptoms worsen or fail to improve.   Back Exam   Tenderness  The patient is experiencing no tenderness.    Right Shoulder Exam   Tenderness  Right shoulder tenderness location: Very mild biceps tendon.  Range of Motion  The patient has normal right shoulder ROM.  Muscle Strength  The patient has normal  right shoulder strength.  Tests  Impingement: negative  Other  Erythema: absent      Patient is alert, oriented, no adenopathy, well-dressed, normal affect, normal respiratory effort.  He has swelling of sternal end of clavicle no erythema no warmth. Tenderness of the sternoclavicular joint as well as the clavicle.  Imaging: No results found. No images are attached to the encounter.  Labs: No results found for: HGBA1C, ESRSEDRATE, CRP, LABURIC, REPTSTATUS, GRAMSTAIN, CULT, LABORGA   No results found for: ALBUMIN, PREALBUMIN, LABURIC  Lab Results  Component Value Date   MG 2.1 09/26/2016   No results found for: VD25OH  No results found for: PREALBUMIN CBC EXTENDED Latest Ref Rng & Units 09/26/2016 03/17/2015 03/05/2015  WBC 4.0 - 10.5 K/uL 5.2 5.2 4.0  RBC 3.87 - 5.11 MIL/uL 4.67 3.35(L) 4.37  HGB 12.0 - 15.0 g/dL 14.5 10.4(L) 13.1  HCT 36.0 - 46.0 % 42.7 31.2(L) 40.1  PLT 150 - 400 K/uL 207 156 206     Body mass index is 32.69 kg/m.  Orders:  Orders Placed This Encounter  Procedures  . XR Shoulder Right   No orders of the defined types were placed in this encounter.    Procedures: No procedures performed  Clinical Data: No additional findings.  ROS:  All other systems negative, except as noted in the HPI. Review of Systems  Objective: Vital Signs: Ht 5\' 8"  (1.727 m)   Wt 215 lb (97.5 kg)   BMI 32.69 kg/m   Specialty Comments:  No specialty comments available.  PMFS History: Patient Active Problem List   Diagnosis Date Noted  . Osteoarthritis of left hip 03/16/2015  . Status post total replacement of left hip 03/16/2015  . Status post total replacement of right hip 06/30/2014  . Lumbar back pain with radiculopathy affecting right lower extremity 04/09/2014  . DDD (degenerative disc disease), lumbar 04/09/2014  . Chronic right hip pain 01/28/2014  . Osteoarthritis of right hip 01/28/2014   Past Medical History:  Diagnosis Date  . Allergic  rhinitis   . Anxiety   . Arthritis   . Cancer ([place])    skin cancer  . Complication of anesthesia    difficulty waking up after surgery as a child.   . Fluid retention   . Headache    migraines when she was younger  . Hyperlipidemia   . Hypertension    never has taken medications  . Hypothyroidism   . PCOS (polycystic ovarian syndrome)   . Perimenopausal   . PONV (postoperative nausea and vomiting)   . Pre-diabetes   . Rosacea   . Wears glasses     Family History  Problem Relation Age of Onset  . Anemia Mother   . Hyperlipidemia Mother   . Hypertension Mother   . Stroke Father   . Hyperlipidemia Father   . Cancer Father   . CVA Father   . Cancer Other   . Hypertension Other   . Hyperlipidemia Other   . Stroke Other   . Heart attack Maternal Grandfather   . Cancer Maternal Grandfather   . Cancer Paternal Grandfather   . Alcohol abuse Paternal Grandfather     Past Surgical History:  Procedure Laterality Date  . COLONOSCOPY    . FERTILITY SURGERY    . MOHS SURGERY     x 2  . TONSILLECTOMY    . TOTAL HIP ARTHROPLASTY Right 06/30/2014   Procedure: RIGHT TOTAL HIP ARTHROPLASTY ANTERIOR APPROACH;  Surgeon: Mcarthur Rossetti, MD;  Location: [facility];  Service: Orthopedics;  Laterality: Right;  . TOTAL HIP ARTHROPLASTY Left 03/16/2015   Procedure: LEFT TOTAL HIP ARTHROPLASTY ANTERIOR APPROACH;  Surgeon: Mcarthur Rossetti, MD;  Location: [facility];  Service: Orthopedics;  Laterality: Left;   Social History   Occupational History  . Not on file  Tobacco Use  . Smoking status: Never Smoker  . Smokeless tobacco: Never Used  Substance and Sexual Activity  . Alcohol use: Yes    Alcohol/week: 14.0 standard drinks    Types: 14 Glasses of wine per week    Comment: 1-2 glasses of wine daily  . Drug use: No  . Sexual activity: Not on file

## 2018-10-10 NOTE — Telephone Encounter (Signed)
Order placed to [facility] for APAP.

## 2018-10-10 NOTE — Telephone Encounter (Signed)
     Ok to order ResMed CPAP on auto from 4 to 18cm H2O with heated humiidty and mask of choice with download in 2 weeks.  Followup with me virtual in 10 weeks

## 2018-10-10 NOTE — Telephone Encounter (Signed)
Order placed to [facility] for APAP.

## 2018-10-15 DIAGNOSIS — I10 Essential (primary) hypertension: Secondary | ICD-10-CM | POA: Diagnosis not present

## 2018-10-16 DIAGNOSIS — Z803 Family history of malignant neoplasm of breast: Secondary | ICD-10-CM | POA: Diagnosis not present

## 2018-10-16 DIAGNOSIS — Z1231 Encounter for screening mammogram for malignant neoplasm of breast: Secondary | ICD-10-CM | POA: Diagnosis not present

## 2018-10-16 DIAGNOSIS — E2839 Other primary ovarian failure: Secondary | ICD-10-CM | POA: Diagnosis not present

## 2018-10-16 DIAGNOSIS — Z780 Asymptomatic menopausal state: Secondary | ICD-10-CM | POA: Diagnosis not present

## 2018-10-16 DIAGNOSIS — E119 Type 2 diabetes mellitus without complications: Secondary | ICD-10-CM | POA: Diagnosis not present

## 2018-10-16 DIAGNOSIS — R29890 Loss of height: Secondary | ICD-10-CM | POA: Diagnosis not present

## 2018-11-14 DIAGNOSIS — G4733 Obstructive sleep apnea (adult) (pediatric): Secondary | ICD-10-CM | POA: Diagnosis not present

## 2018-11-20 ENCOUNTER — Telehealth: Payer: Self-pay | Admitting: *Deleted

## 2018-11-20 NOTE — Telephone Encounter (Signed)
Patient has a 10 week follow up appointment scheduled for 01/20/19 12:40. Patient understands she needs to keep this appointment for insurance compliance. Patient was grateful for the call and thanked me.

## 2018-11-20 NOTE — Telephone Encounter (Signed)
DME selection is [facility]. Patient understands she will be contacted by [facility] to set up her cpap. Patient understands to call if [facility] does not contact her with new setup in a timely manner. Patient understands they will be called once confirmation has been received from [facility] that they have received their new machine to schedule 10 week follow up appointment.  [facility] notified of new cpap order  Please add to airview Patient was grateful for the call and thanked me.

## 2018-11-21 ENCOUNTER — Other Ambulatory Visit: Payer: Self-pay

## 2018-11-21 ENCOUNTER — Ambulatory Visit (INDEPENDENT_AMBULATORY_CARE_PROVIDER_SITE_OTHER): Payer: BC Managed Care – PPO | Admitting: Family Medicine

## 2018-11-21 ENCOUNTER — Ambulatory Visit: Payer: Self-pay

## 2018-11-21 ENCOUNTER — Encounter: Payer: Self-pay | Admitting: Family Medicine

## 2018-11-21 DIAGNOSIS — M25511 Pain in right shoulder: Secondary | ICD-10-CM | POA: Diagnosis not present

## 2018-11-21 NOTE — Progress Notes (Signed)
Office Visit Note   Patient: Sarah Blackwell           Date of Birth: Oct 05, 1955           MRN: [id number] Visit Date: 11/21/2018 Requested by: Cari Caraway, [place],  [place] [address] PCP: Cari Caraway, MD  Subjective: Chief Complaint  Patient presents with  . Right Shoulder - Pain    Pain in shoulder and into right clavicle since August. Has been to [facility], PT - been having dry needling and other therapy - requests Rx for this. HEP.    HPI: She is here at the request of Kym Groom for right shoulder pain.  Pain for 2 months with no injury.  She recently started getting dry needling treatments which have helped, but the pain is not going away.  Pain seems to be near the sternoclavicular joint and radiates outward.  She has noticed some puffiness in this area as well.  No fevers or chills.  No personal or family history of rheumatologic disease but patient does have a history of arthritis in multiple joints and has had both hips replaced.              ROS:   All other systems were reviewed and are negative.  Objective: Vital Signs: There were no vitals taken for this visit.  Physical Exam:  General:  Alert and oriented, in no acute distress. Pulm:  Breathing unlabored. Psy:  Normal mood, congruent affect. Skin: There is slight erythema over the clavicular joint. Right shoulder: Full active range of motion, negative AC crossover test, no tenderness at the [facility] joint.  She is moderately tender at the sternoclavicular joint and has bogginess there.  No crepitation.  Imaging: Limited diagnostic ultrasound: She has sternoclavicular synovial hypertrophy with hyperemia on power Doppler imaging.  Assessment & Plan: 1.  Right sternoclavicular synovitis, etiology uncertain. -We will draw labs to look for rheumatologic/inflammatory process.  If labs are totally normal, then MRI scan.     Procedures: No procedures performed  No notes on file     PMFS History: Patient Active Problem List   Diagnosis Date Noted  . Osteoarthritis of left hip 03/16/2015  . Status post total replacement of left hip 03/16/2015  . Status post total replacement of right hip 06/30/2014  . Lumbar back pain with radiculopathy affecting right lower extremity 04/09/2014  . DDD (degenerative disc disease), lumbar 04/09/2014  . Chronic right hip pain 01/28/2014  . Osteoarthritis of right hip 01/28/2014   Past Medical History:  Diagnosis Date  . Allergic rhinitis   . Anxiety   . Arthritis   . Cancer ([place])    skin cancer  . Complication of anesthesia    difficulty waking up after surgery as a child.   . Fluid retention   . Headache    migraines when she was younger  . Hyperlipidemia   . Hypertension    never has taken medications  . Hypothyroidism   . PCOS (polycystic ovarian syndrome)   . Perimenopausal   . PONV (postoperative nausea and vomiting)   . Pre-diabetes   . Rosacea   . Wears glasses     Family History  Problem Relation Age of Onset  . Anemia Mother   . Hyperlipidemia Mother   . Hypertension Mother   . Stroke Father   . Hyperlipidemia Father   . Cancer Father   . CVA Father   . Cancer Other   . Hypertension  Other   . Hyperlipidemia Other   . Stroke Other   . Heart attack Maternal Grandfather   . Cancer Maternal Grandfather   . Cancer Paternal Grandfather   . Alcohol abuse Paternal Grandfather     Past Surgical History:  Procedure Laterality Date  . COLONOSCOPY    . FERTILITY SURGERY    . MOHS SURGERY     x 2  . TONSILLECTOMY    . TOTAL HIP ARTHROPLASTY Right 06/30/2014   Procedure: RIGHT TOTAL HIP ARTHROPLASTY ANTERIOR APPROACH;  Surgeon: Mcarthur Rossetti, MD;  Location: [place];  Service: Orthopedics;  Laterality: Right;  . TOTAL HIP ARTHROPLASTY Left 03/16/2015   Procedure: LEFT TOTAL HIP ARTHROPLASTY ANTERIOR APPROACH;  Surgeon: Mcarthur Rossetti, MD;  Location: [place];  Service: Orthopedics;  Laterality:  Left;   Social History   Occupational History  . Not on file  Tobacco Use  . Smoking status: Never Smoker  . Smokeless tobacco: Never Used  Substance and Sexual Activity  . Alcohol use: Yes    Alcohol/week: 14.0 standard drinks    Types: 14 Glasses of wine per week    Comment: 1-2 glasses of wine daily  . Drug use: No  . Sexual activity: Not on file

## 2018-11-23 LAB — C-REACTIVE PROTEIN: CRP: 1 mg/L (ref <8.0)

## 2018-11-23 LAB — RHEUMATOID FACTOR: Rheumatoid fact SerPl-aCnc: <14 IU/mL (ref <14)

## 2018-11-23 LAB — ANA: Anti Nuclear Antibody (ANA): NEGATIVE

## 2018-11-23 LAB — SEDIMENTATION RATE: Sed Rate: 2 mm/h (ref 0–30)

## 2018-11-23 LAB — URIC ACID: Uric Acid, Serum: 5.2 mg/dL (ref 2.5–7.0)

## 2018-11-23 LAB — CYCLIC CITRUL PEPTIDE ANTIBODY, IGG: Cyclic Citrullin Peptide Ab: <16 UNITS

## 2018-11-25 ENCOUNTER — Telehealth: Payer: Self-pay | Admitting: Family Medicine

## 2018-11-25 DIAGNOSIS — R0789 Other chest pain: Secondary | ICD-10-CM

## 2018-11-25 NOTE — Telephone Encounter (Signed)
Patient left a voicemail message wanting to get the results of her bloodwork.  CB#[phone].  Thank you.

## 2018-11-25 NOTE — Telephone Encounter (Signed)
I called and spoke with the patient -see other message on this from today.

## 2018-11-25 NOTE — Telephone Encounter (Signed)
I called and advised the patient of her results and plan. She voiced understanding and is ok with proceeding with the MRI.

## 2018-11-25 NOTE — Telephone Encounter (Signed)
Labs were all normal.  I have ordered MRI of sternum to further evaluate.

## 2018-12-12 ENCOUNTER — Telehealth: Payer: Self-pay | Admitting: *Deleted

## 2018-12-12 DIAGNOSIS — L720 Epidermal cyst: Secondary | ICD-10-CM | POA: Diagnosis not present

## 2018-12-12 DIAGNOSIS — L820 Inflamed seborrheic keratosis: Secondary | ICD-10-CM | POA: Diagnosis not present

## 2018-12-12 DIAGNOSIS — L570 Actinic keratosis: Secondary | ICD-10-CM | POA: Diagnosis not present

## 2018-12-12 NOTE — Telephone Encounter (Signed)
Ivin Booty at [facility] called to say patient is still in her compliance period and only needs a virtual visit with you to document why she has not been compliant and her problems and then she can be switched to Bipap without going back to the lab. I have moved her 01/20/19 appointment up to 12/24/18 so she will not loose her unit for non-compliance.

## 2018-12-12 NOTE — Telephone Encounter (Signed)
  Freada Bergeron, CMA  P Cv Div Sleep Studies        BIPAP titraiton     ----- Message -----  From: Sueanne Margarita, MD  Sent: 12/04/2018 11:08 PM EST  To: Freada Bergeron, CMA  Subject: RE: Pressure too strong              I reviewed her download and her AHI is 9/hr on CPAP. I think she is going to need BiPAP in order to get a lower pressure to tolerate. Please order in lab BIPAP titraiton   Traci  ----- Message -----  From: Freada Bergeron, CMA  Sent: 12/03/2018  4:20 PM EST  To: Sueanne Margarita, MD  Subject: Pressure too strong                Patient called to say her pressure is blowing too hard when she first lies down and she feels claustrophobic then upon going to the bathroom she can not get back to sleep at all. She ends up removing the mask for the night eventually.  Please adfvise.

## 2018-12-14 DIAGNOSIS — G4733 Obstructive sleep apnea (adult) (pediatric): Secondary | ICD-10-CM | POA: Diagnosis not present

## 2018-12-15 ENCOUNTER — Other Ambulatory Visit: Payer: Self-pay

## 2018-12-15 ENCOUNTER — Ambulatory Visit
Admission: RE | Admit: 2018-12-15 | Discharge: 2018-12-15 | Disposition: A | Discharge status: Home / Self Care | Payer: BC Managed Care – PPO | Source: Ambulatory Visit | Attending: Family Medicine | Admitting: Family Medicine

## 2018-12-15 DIAGNOSIS — R0789 Other chest pain: Secondary | ICD-10-CM

## 2018-12-15 DIAGNOSIS — M19011 Primary osteoarthritis, right shoulder: Secondary | ICD-10-CM | POA: Diagnosis not present

## 2018-12-16 ENCOUNTER — Telehealth: Payer: Self-pay | Admitting: Family Medicine

## 2018-12-16 DIAGNOSIS — M8949 Other hypertrophic osteoarthropathy, multiple sites: Secondary | ICD-10-CM

## 2018-12-16 DIAGNOSIS — M25511 Pain in right shoulder: Secondary | ICD-10-CM

## 2018-12-16 DIAGNOSIS — M159 Polyosteoarthritis, unspecified: Secondary | ICD-10-CM

## 2018-12-16 NOTE — Telephone Encounter (Signed)
MRI shows inflammation in the sternoclavicular joint.  It's most likely from arthritis.  Radiologist raised the possibility of infection, but with normal inflammatory blood tests infection would be unlikely.  Treatment options could include doing a one-time cortisone injection, or else referral to a rheumatologist for further evaluation.

## 2018-12-17 ENCOUNTER — Telehealth: Payer: Self-pay | Admitting: Family Medicine

## 2018-12-17 MED ORDER — NABUMETONE 750 MG PO TABS: 750.0000 mg | ORAL_TABLET | Freq: Two times a day (BID) | ORAL | 6 refills | Status: DC | PRN

## 2018-12-17 NOTE — Telephone Encounter (Signed)
I called and advised the patient of her MRI results. She does not like the thought of a cortisone injection, as this did not help her hips (and she subsequently had them replaced). The patient had several questions: 1) She asked if taking an oral antiinflammatory instead would work in place of the injection. 2) Did dry needling make the area worse? If not, should she resume this? She continues to have this done every 2 weeks on her shoulder (with Kym Groom). 3) What else would a rheumatologist do for her since she has already had blood work for autoimmune diseases done?

## 2018-12-17 NOTE — Telephone Encounter (Signed)
If it's not helping after a couple weeks, then it likely won't help.

## 2018-12-17 NOTE — Telephone Encounter (Signed)
1.  Will call in relafen.  [place] to resume dry needling for muscles around the shoulder, but avoid the sternoclavicular joint itself.  3.  Rheumatologist might order more specialized tests.  But we can try #1 first.

## 2018-12-17 NOTE — Addendum Note (Signed)
Addended by: Hortencia Pilar on: 12/17/2018 11:44 AM   Modules accepted: Orders

## 2018-12-17 NOTE — Addendum Note (Signed)
Addended by: Hortencia Pilar on: 12/17/2018 03:23 PM   Modules accepted: Orders

## 2018-12-17 NOTE — Telephone Encounter (Signed)
I called the patient with the results - see other message on this from today.

## 2018-12-17 NOTE — Telephone Encounter (Signed)
I called and advised the patient of the plan. She will try the Relafen and let us know in a few weeks if she has seen any improvement or not.   She would like to go ahead and be referred to a rheumatologist.

## 2018-12-17 NOTE — Telephone Encounter (Signed)
Patient called needing the results of her MRI. The number to contact patient is [phone]

## 2018-12-17 NOTE — Telephone Encounter (Signed)
Done

## 2018-12-17 NOTE — Telephone Encounter (Signed)
How much time should she give the relafen to work?

## 2018-12-19 ENCOUNTER — Telehealth: Payer: Self-pay | Admitting: Family Medicine

## 2018-12-19 NOTE — Telephone Encounter (Signed)
Patient called and and stated to have a medication question. Patient asking for Dr. Park Liter nurse to give patient a call back. Patient phone number is [phone].

## 2018-12-19 NOTE — Telephone Encounter (Signed)
I called the patient. She read the medication information sheet that came with the nabumetone. In it, there was mention of a possible interaction between nabumetone and losartan. I advised her to check with her pharmacist on this to get more information on it. She will call us back if she would like to change to something else. Second, she would like to see Dr. Amil Amen (order for rheumatologist has already been placed). I sent this information on to the referral coordinator.

## 2018-12-24 ENCOUNTER — Other Ambulatory Visit: Payer: Self-pay

## 2018-12-24 ENCOUNTER — Telehealth (INDEPENDENT_AMBULATORY_CARE_PROVIDER_SITE_OTHER): Payer: BC Managed Care – PPO | Admitting: Cardiology

## 2018-12-24 ENCOUNTER — Encounter: Payer: Self-pay | Admitting: Cardiology

## 2018-12-24 VITALS — BP 120/76 | HR 72 | Ht 68.0 in | Wt 188.0 lb

## 2018-12-24 DIAGNOSIS — G4733 Obstructive sleep apnea (adult) (pediatric): Secondary | ICD-10-CM

## 2018-12-24 DIAGNOSIS — E669 Obesity, unspecified: Secondary | ICD-10-CM | POA: Diagnosis not present

## 2018-12-24 DIAGNOSIS — I10 Essential (primary) hypertension: Secondary | ICD-10-CM

## 2018-12-24 NOTE — Progress Notes (Signed)
Virtual Visit via Video Note   This visit type was conducted due to national recommendations for restrictions regarding the COVID-19 Pandemic (e.g. social distancing) in an effort to limit this patient's exposure and mitigate transmission in our community.  Due to her co-morbid illnesses, this patient is at least at moderate risk for complications without adequate follow up.  This format is felt to be most appropriate for this patient at this time.  All issues noted in this document were discussed and addressed.  A limited physical exam was performed with this format.  Please refer to the patient's chart for her consent to telehealth for [facility].   Evaluation Performed:  Follow-up visit  This visit type was conducted due to national recommendations for restrictions regarding the COVID-19 Pandemic (e.g. social distancing).  This format is felt to be most appropriate for this patient at this time.  All issues noted in this document were discussed and addressed.  No physical exam was performed (except for noted visual exam findings with Video Visits).  Please refer to the patient's chart (MyChart message for video visits and phone note for telephone visits) for the patient's consent to telehealth for [facility].  Date:  12/24/2018   ID:  Sarah Blackwell, DOB [birth year], MRN [id number]  Patient Location:  Home  Provider location:   [place]  PCP:  Cari Caraway, MD  Sleep Medicine:  Fransico Him, MD Electrophysiologist:  None   Chief Complaint:  OSA  History of Present Illness:    Sarah Blackwell is a 63 y.o. female who presents via audio/video conferencing for a telehealth visit today.    This is a 63yo female with a hx of HTN, HLD, PAF and preDM who was referred by Dr. Marlou Porch for evaluation of sleep apnea due to snoring, PAF and HTN.  She underwent home sleep study which revealed mild to moderate OSA with an AHI of 14.6/hr with no significant desaturations.  She was started  on Auto CPAP.  She started out on auto CPAP with a nasal pillow mask.  She is very claustraphobic and was not able to tolerate the nasal pillow mask and stopped using her device.  She has lost 25lbs and is trying to lose another 25lbs to see if it helps with her OSA and PAF.  She has not had any problems with palpitation since going on the Flecainide.    The patient does not have symptoms concerning for COVID-19 infection (fever, chills, cough, or new shortness of breath).    Prior CV studies:   The following studies were reviewed today:  Home sleep study  Past Medical History:  Diagnosis Date  . Allergic rhinitis   . Anxiety   . Arthritis   . Cancer ([place])    skin cancer  . Complication of anesthesia    difficulty waking up after surgery as a child.   . Fluid retention   . Headache    migraines when she was younger  . Hyperlipidemia   . Hypertension    never has taken medications  . Hypothyroidism   . PCOS (polycystic ovarian syndrome)   . Perimenopausal   . PONV (postoperative nausea and vomiting)   . Pre-diabetes   . Rosacea   . Wears glasses    Past Surgical History:  Procedure Laterality Date  . COLONOSCOPY    . FERTILITY SURGERY    . MOHS SURGERY     x 2  . TONSILLECTOMY    . TOTAL HIP ARTHROPLASTY Right  06/30/2014   Procedure: RIGHT TOTAL HIP ARTHROPLASTY ANTERIOR APPROACH;  Surgeon: Mcarthur Rossetti, MD;  Location: [place];  Service: Orthopedics;  Laterality: Right;  . TOTAL HIP ARTHROPLASTY Left 03/16/2015   Procedure: LEFT TOTAL HIP ARTHROPLASTY ANTERIOR APPROACH;  Surgeon: Mcarthur Rossetti, MD;  Location: [place];  Service: Orthopedics;  Laterality: Left;     Current Meds  Medication Sig  . ALPRAZolam (XANAX) 0.5 MG tablet Take 0.5 mg by mouth at bedtime as needed for sleep.   Marland Kitchen amoxicillin (AMOXIL) 500 MG capsule Take 4 capsules by mouth as needed (Before dental work).   . Cholecalciferol (VITAMIN D3 PO) Take 5,000 Units by mouth daily.  Marland Kitchen  diltiazem (CARDIZEM CD) 120 MG 24 hr capsule TAKE ONCE DAILY AND AS NEEDED UP TO 2 PER DAY FOR EPISODES OF AFIB. (Patient taking differently: Take 120 mg by mouth daily. )  . flecainide (TAMBOCOR) 50 MG tablet TAKE 1 TABLET BY MOUTH TWICE A DAY  . fluticasone (FLONASE) 50 MCG/ACT nasal spray Place 1-2 sprays into both nostrils daily as needed for allergies.  Marland Kitchen ibuprofen (ADVIL,MOTRIN) 200 MG tablet Take 200 mg by mouth every 6 (six) hours as needed for moderate pain.  Marland Kitchen levothyroxine (SYNTHROID, LEVOTHROID) 75 MCG tablet Take 75 mcg by mouth daily before breakfast.  . liothyronine (CYTOMEL) 5 MCG tablet Take 5 mcg by mouth daily.  Marland Kitchen losartan (COZAAR) 50 MG tablet Take 50 mg by mouth daily.  . Melatonin 10 MG TABS Take by mouth at bedtime.  . metFORMIN (GLUCOPHAGE-XR) 500 MG 24 hr tablet Take 500 mg by mouth at bedtime.   . nabumetone (RELAFEN) 750 MG tablet Take 1 tablet (750 mg total) by mouth 2 (two) times daily as needed.  . Omega-3 Fatty Acids (OMEGA-3 FISH OIL PO) Take by mouth daily.  . rosuvastatin (CRESTOR) 10 MG tablet Take 10 mg by mouth daily.     Allergies:   Sulfa antibiotics   Social History   Tobacco Use  . Smoking status: Never Smoker  . Smokeless tobacco: Never Used  Substance Use Topics  . Alcohol use: Yes    Alcohol/week: 14.0 standard drinks    Types: 14 Glasses of wine per week    Comment: 1-2 glasses of wine daily  . Drug use: No     Family Hx: The patient's family history includes Alcohol abuse in her paternal grandfather; Anemia in her mother; CVA in her father; Cancer in her father, maternal grandfather, paternal grandfather, and another family member; Heart attack in her maternal grandfather; Hyperlipidemia in her father, mother, and another family member; Hypertension in her mother and another family member; Stroke in her father and another family member.  ROS:   Please see the history of present illness.     All other systems reviewed and are negative.    Labs/Other Tests and Data Reviewed:    Recent Labs: No results found for requested labs within last 8760 hours.   Recent Lipid Panel No results found for: CHOL, TRIG, HDL, CHOLHDL, LDLCALC, LDLDIRECT  Wt Readings from Last 3 Encounters:  12/24/18 188 lb (85.3 kg)  10/04/18 215 lb (97.5 kg)  02/27/18 215 lb (97.5 kg)     Objective:    Vital Signs:  BP 120/76   Pulse 72   Ht 5\' 8"  (1.727 m)   Wt 188 lb (85.3 kg)   BMI 28.59 kg/m    CONSTITUTIONAL:  Well nourished, well developed female in no acute distress.  EYES: anicteric MOUTH:  oral mucosa is pink RESPIRATORY: Normal respiratory effort, symmetric expansion CARDIOVASCULAR: No peripheral edema SKIN: No rash, lesions or ulcers MUSCULOSKELETAL: no digital cyanosis NEURO: Cranial Nerves II-XII grossly intact, moves all extremities PSYCH: Intact judgement and insight.  A&O x 3, Mood/affect appropriate   ASSESSMENT & PLAN:    1.  OSA - Unfortunately she is intolerant to CPAP.  She is very claustraphobic and did not tolerate the nasal pillow mask.  Given this, I do not think a nasal mask or full face mask would be any better.  Since her OSA is mild to moderate with no significant drops in O2, I have recommended a referral to Augustina Mood, DDS for consultation for oral device.  I will see her back in 3 months to see how she is doing.   2.  HTN -BP controlled -continue Cardizem CD 120mg  daily and Losartan 50mg  daily.   3.  Obesity -I have encouraged her to get into a routine exercise program and cut back on carbs and portions. I would like to see her lose another 25lbs.    COVID-19 Education: The signs and symptoms of COVID-19 were discussed with the patient and how to seek care for testing (follow up with PCP or arrange E-visit).  The importance of social distancing was discussed today.  Patient Risk:   After full review of this patient's clinical status, I feel that they are at least moderate risk at this time.  Time:    Today, I have spent 20 minutes directly with the patient on telemedicine discussing medical problems including OSA, HTN, obesity.  We also reviewed the symptoms of COVID 19 and the ways to protect against contracting the virus with telehealth technology.  I spent an additional 5 minutes reviewing patient's chart including home sleep study.  Medication Adjustments/Labs and Tests Ordered: Current medicines are reviewed at length with the patient today.  Concerns regarding medicines are outlined above.  Tests Ordered: No orders of the defined types were placed in this encounter.  Medication Changes: No orders of the defined types were placed in this encounter.   Disposition:  Follow up in 3 months  Signed, Fransico Him, MD  12/24/2018 10:41 AM    [place]

## 2018-12-24 NOTE — Patient Instructions (Addendum)
Medication Instructions:  Your physician recommends that you continue on your current medications as directed. Please refer to the Current Medication list given to you today.  *If you need a refill on your cardiac medications before your next appointment, please call your pharmacy*  Follow-Up: At [facility], you and your health needs are our priority.  As part of our continuing mission to provide you with exceptional heart care, we have created designated Provider Care Teams.  These Care Teams include your primary Cardiologist (physician) and Advanced Practice Providers (APPs -  Physician Assistants and Nurse Practitioners) who all work together to provide you with the care you need, when you need it.  Your next appointment:   3 month(s)  The format for your next appointment:   Virtual Visit   Provider:   Fransico Him, MD  Other Instructions You have been referred to Dr. Augustina Mood, DDS for a consultation regarding an oral device for sleep apnea.

## 2018-12-25 ENCOUNTER — Telehealth: Payer: Self-pay | Admitting: *Deleted

## 2018-12-25 NOTE — Telephone Encounter (Signed)
-----   Message from Sueanne Margarita, MD sent at 12/24/2018 10:14 AM EST ----- Please let DME know that patient will send device back - she is intolerant to CPAP

## 2018-12-25 NOTE — Telephone Encounter (Signed)
[facility] [place]) has been notified.

## 2019-01-07 DIAGNOSIS — Z03818 Encounter for observation for suspected exposure to other biological agents ruled out: Secondary | ICD-10-CM | POA: Diagnosis not present

## 2019-01-07 DIAGNOSIS — Z20828 Contact with and (suspected) exposure to other viral communicable diseases: Secondary | ICD-10-CM | POA: Diagnosis not present

## 2019-01-07 DIAGNOSIS — J029 Acute pharyngitis, unspecified: Secondary | ICD-10-CM | POA: Diagnosis not present

## 2019-01-07 DIAGNOSIS — R5383 Other fatigue: Secondary | ICD-10-CM | POA: Diagnosis not present

## 2019-01-14 ENCOUNTER — Telehealth: Payer: Self-pay | Admitting: Family Medicine

## 2019-01-14 MED ORDER — DICLOFENAC SODIUM 75 MG PO TBEC: 75.0000 mg | DELAYED_RELEASE_TABLET | Freq: Two times a day (BID) | ORAL | 3 refills | Status: DC | PRN

## 2019-01-14 NOTE — Telephone Encounter (Signed)
Patient called requesting for Dr. Junius Roads nurse give a call back. Patient has questions about condition. Patient phone number is [phone].

## 2019-01-14 NOTE — Telephone Encounter (Signed)
The patient has now been on the Relafen for 4 weeks for her [place] joint. The pain in that area is no better (although her ankles feel better) and the area is actually bigger. She still is not thrilled about trying a cortisone injection (after having one in her hip in the past). Her appointment with the rheumatologist is still pending ( I gave her the number to that office so she can call and check on this, herself, since the referral has been sent to them). She would like to know what the next step is.

## 2019-01-14 NOTE — Telephone Encounter (Signed)
Should she just stop the Relafen?

## 2019-01-14 NOTE — Telephone Encounter (Signed)
Unfortunately, other than rheumatology referral and cortisone injection, I don't have much else to offer.  Could try a different NSAID, but not sure it will provide any more relief.

## 2019-01-14 NOTE — Telephone Encounter (Signed)
Yes, can stop it.  Will call in diclofenac tablets in case she wants to try something else.

## 2019-01-14 NOTE — Telephone Encounter (Signed)
I called the patient. She is choosing to try the diclofenac to see if it works better. She has not had time to try calling [facility] about the appointment yet, but plans to do so.

## 2019-01-20 ENCOUNTER — Telehealth: Payer: BC Managed Care – PPO | Admitting: Cardiology

## 2019-01-24 DIAGNOSIS — M25511 Pain in right shoulder: Secondary | ICD-10-CM | POA: Diagnosis not present

## 2019-01-24 DIAGNOSIS — M2550 Pain in unspecified joint: Secondary | ICD-10-CM | POA: Diagnosis not present

## 2019-02-12 DIAGNOSIS — Z03818 Encounter for observation for suspected exposure to other biological agents ruled out: Secondary | ICD-10-CM | POA: Diagnosis not present

## 2019-02-12 DIAGNOSIS — Z20828 Contact with and (suspected) exposure to other viral communicable diseases: Secondary | ICD-10-CM | POA: Diagnosis not present

## 2019-03-03 ENCOUNTER — Other Ambulatory Visit (HOSPITAL_COMMUNITY)
Admission: RE | Admit: 2019-03-03 | Discharge: 2019-03-03 | Disposition: A | Discharge status: Home / Self Care | Payer: BC Managed Care – PPO | Source: Ambulatory Visit | Attending: Family Medicine | Admitting: Family Medicine

## 2019-03-03 ENCOUNTER — Other Ambulatory Visit: Payer: Self-pay | Admitting: Family Medicine

## 2019-03-03 DIAGNOSIS — I10 Essential (primary) hypertension: Secondary | ICD-10-CM | POA: Diagnosis not present

## 2019-03-03 DIAGNOSIS — R7309 Other abnormal glucose: Secondary | ICD-10-CM | POA: Diagnosis not present

## 2019-03-03 DIAGNOSIS — Z124 Encounter for screening for malignant neoplasm of cervix: Secondary | ICD-10-CM | POA: Diagnosis not present

## 2019-03-03 DIAGNOSIS — E039 Hypothyroidism, unspecified: Secondary | ICD-10-CM | POA: Diagnosis not present

## 2019-03-03 DIAGNOSIS — Z0000 Encounter for general adult medical examination without abnormal findings: Secondary | ICD-10-CM | POA: Diagnosis not present

## 2019-03-03 DIAGNOSIS — E782 Mixed hyperlipidemia: Secondary | ICD-10-CM | POA: Diagnosis not present

## 2019-03-03 DIAGNOSIS — E559 Vitamin D deficiency, unspecified: Secondary | ICD-10-CM | POA: Diagnosis not present

## 2019-03-06 ENCOUNTER — Telehealth: Payer: Self-pay | Admitting: Cardiology

## 2019-03-06 NOTE — Telephone Encounter (Signed)
Patient was calling to check on the status of her referral to a specialist for a mouthpiece for her cpap

## 2019-03-06 NOTE — Telephone Encounter (Signed)
Referral to Dr Augustina Mood has been sent via fax to [facility].

## 2019-03-07 LAB — CYTOLOGY - PAP: Diagnosis: NEGATIVE

## 2019-04-16 ENCOUNTER — Other Ambulatory Visit: Payer: Self-pay | Admitting: Cardiology

## 2019-04-16 MED ORDER — DILTIAZEM HCL ER COATED BEADS 120 MG PO CP24: ORAL_CAPSULE | ORAL | 0 refills | Status: DC

## 2019-04-23 ENCOUNTER — Other Ambulatory Visit: Payer: Self-pay

## 2019-04-23 MED ORDER — DILTIAZEM HCL ER COATED BEADS 120 MG PO CP24: ORAL_CAPSULE | ORAL | 0 refills | Status: DC

## 2019-04-23 MED ORDER — FLECAINIDE ACETATE 50 MG PO TABS: 50.0000 mg | ORAL_TABLET | Freq: Two times a day (BID) | ORAL | 0 refills | Status: DC

## 2019-04-23 NOTE — Telephone Encounter (Signed)
Pt's medications were sent to pt's pharmacy as requested. Confirmation received.  

## 2019-07-17 ENCOUNTER — Ambulatory Visit: Payer: BC Managed Care – PPO | Admitting: Family Medicine

## 2019-07-20 ENCOUNTER — Other Ambulatory Visit: Payer: Self-pay | Admitting: Cardiology

## 2019-07-23 DIAGNOSIS — J029 Acute pharyngitis, unspecified: Secondary | ICD-10-CM | POA: Diagnosis not present

## 2019-07-23 DIAGNOSIS — Z03818 Encounter for observation for suspected exposure to other biological agents ruled out: Secondary | ICD-10-CM | POA: Diagnosis not present

## 2019-07-23 DIAGNOSIS — Z20822 Contact with and (suspected) exposure to covid-19: Secondary | ICD-10-CM | POA: Diagnosis not present

## 2019-07-24 ENCOUNTER — Other Ambulatory Visit: Payer: Self-pay | Admitting: Cardiology

## 2019-07-31 ENCOUNTER — Other Ambulatory Visit: Payer: Self-pay | Admitting: Cardiology

## 2019-08-12 ENCOUNTER — Ambulatory Visit: Payer: BC Managed Care – PPO | Admitting: Orthopaedic Surgery

## 2019-08-26 ENCOUNTER — Other Ambulatory Visit: Payer: Self-pay | Admitting: Cardiology

## 2019-08-28 ENCOUNTER — Encounter: Payer: Self-pay | Admitting: Orthopaedic Surgery

## 2019-08-28 ENCOUNTER — Ambulatory Visit: Payer: Self-pay

## 2019-08-28 ENCOUNTER — Ambulatory Visit (INDEPENDENT_AMBULATORY_CARE_PROVIDER_SITE_OTHER): Payer: BC Managed Care – PPO | Admitting: Orthopaedic Surgery

## 2019-08-28 DIAGNOSIS — Z96642 Presence of left artificial hip joint: Secondary | ICD-10-CM | POA: Diagnosis not present

## 2019-08-28 DIAGNOSIS — Z96641 Presence of right artificial hip joint: Secondary | ICD-10-CM | POA: Diagnosis not present

## 2019-08-28 DIAGNOSIS — M25551 Pain in right hip: Secondary | ICD-10-CM | POA: Diagnosis not present

## 2019-08-28 MED ORDER — LIDOCAINE HCL 1 % IJ SOLN
3.0000 mL | INTRAMUSCULAR | Status: AC | PRN
  Administered 2019-08-28: 3 mL

## 2019-08-28 MED ORDER — METHYLPREDNISOLONE ACETATE 40 MG/ML IJ SUSP
40.0000 mg | INTRAMUSCULAR | Status: AC | PRN
  Administered 2019-08-28: 40 mg via INTRA_ARTICULAR

## 2019-08-28 MED ORDER — ETODOLAC 500 MG PO TABS: 500.0000 mg | ORAL_TABLET | Freq: Two times a day (BID) | ORAL | 3 refills | Status: DC | PRN

## 2019-08-28 NOTE — Progress Notes (Signed)
Office Visit Note   Patient: Sarah Blackwell           Date of Birth: September 07, 1955           MRN: [id number] Visit Date: 08/28/2019              Requested by: Sarah Blackwell, [place],  [place] [address] PCP: Sarah Caraway, MD   Assessment & Plan: Visit Diagnoses:  1. Pain in right hip   2. History of total replacement of right hip   3. History of total left hip replacement     Plan: I would like to send her to physical therapy for trochanteric bursitis and IT band syndrome.  I did show her some stretching exercises to try and I recommended Voltaren gel.  I am also going to try etodolac as an anti-inflammatory.  All questions and concerns were answered and addressed.  I gave her a prescription for physical therapy at [facility] physical therapy.  I would like to see her back in 6 weeks to see how she is doing overall.  I did place a steroid injection over the trochanteric area as well which he tolerated well.  Follow-Up Instructions: Return in about 6 weeks (around 10/09/2019).   Orders:  Orders Placed This Encounter  Procedures  . Large Joint Inj: R greater trochanter  . XR HIP UNILAT W OR W/O PELVIS 1V RIGHT   Meds ordered this encounter  Medications  . etodolac (LODINE) 500 MG tablet    Sig: Take 1 tablet (500 mg total) by mouth 2 (two) times daily between meals as needed.    Dispense:  60 tablet    Refill:  3      Procedures: Large Joint Inj: R greater trochanter on 08/28/2019 1:28 PM Indications: pain and diagnostic evaluation Details: 22 G 1.5 in needle, lateral approach  Arthrogram: No  Medications: 3 mL lidocaine 1 %; 40 mg methylPREDNISolone acetate 40 MG/ML Outcome: tolerated well, no immediate complications Procedure, treatment alternatives, risks and benefits explained, specific risks discussed. Consent was given by the patient. Immediately prior to procedure a time out was called to verify the correct patient, procedure, equipment, support  staff and site/side marked as required. Patient was prepped and draped in the usual sterile fashion.       Clinical Data: No additional findings.   Subjective: Chief Complaint  Patient presents with  . Right Hip - Pain  The patient is well-known to me.  She has a history of bilateral hip replacements that were done with the most recent 2017.  The right hip is hurting her quite a bit since then.  She points the lateral aspect of her hip as well as her low back as a source of her pain.  There is some pain in the sciatic region.  She walks without assistive device.  She is 60 and very active and does play pickle ball and want to get back in the next after delay from playing tennis for some time due to her situation with her hips.  She has had some physical therapy in the past as well.  She is been no anti-inflammatories.  She walks without assistive device and is again very active.  There is been no acute change in medical status.  HPI  Review of Systems  there is currently no headache, chest pain, shortness of breath, fever, chills, nausea, vomiting  Objective: Vital Signs: There were no vitals taken for this visit.  Physical Exam  She is alert and orient x3 and in no acute distress Ortho Exam Examination of both hips show the move smoothly and fluidly.  When I have her lay in a supine position I feel like her leg lengths are equal.  When I have her lay in a decubitus position examined the right hip there is significant pain over the trochanteric area and the IT band.  There are some low back pain associated with this as well. Specialty Comments:  No specialty comments available.  Imaging: No results found.   PMFS History: Patient Active Problem List   Diagnosis Date Noted  . History of total replacement of right hip 08/28/2019  . History of total left hip replacement 08/28/2019  . Osteoarthritis of left hip 03/16/2015  . Status post total replacement of left hip 03/16/2015  .  Status post total replacement of right hip 06/30/2014  . Lumbar back pain with radiculopathy affecting right lower extremity 04/09/2014  . DDD (degenerative disc disease), lumbar 04/09/2014  . Chronic right hip pain 01/28/2014  . Osteoarthritis of right hip 01/28/2014   Past Medical History:  Diagnosis Date  . Allergic rhinitis   . Anxiety   . Arthritis   . Cancer ([place])    skin cancer  . Complication of anesthesia    difficulty waking up after surgery as a child.   . Fluid retention   . Headache    migraines when she was younger  . Hyperlipidemia   . Hypertension    never has taken medications  . Hypothyroidism   . PCOS (polycystic ovarian syndrome)   . Perimenopausal   . PONV (postoperative nausea and vomiting)   . Pre-diabetes   . Rosacea   . Wears glasses     Family History  Problem Relation Age of Onset  . Anemia Mother   . Hyperlipidemia Mother   . Hypertension Mother   . Stroke Father   . Hyperlipidemia Father   . Cancer Father   . CVA Father   . Cancer Other   . Hypertension Other   . Hyperlipidemia Other   . Stroke Other   . Heart attack Maternal Grandfather   . Cancer Maternal Grandfather   . Cancer Paternal Grandfather   . Alcohol abuse Paternal Grandfather     Past Surgical History:  Procedure Laterality Date  . COLONOSCOPY    . FERTILITY SURGERY    . MOHS SURGERY     x 2  . TONSILLECTOMY    . TOTAL HIP ARTHROPLASTY Right 06/30/2014   Procedure: RIGHT TOTAL HIP ARTHROPLASTY ANTERIOR APPROACH;  Surgeon: Mcarthur Rossetti, MD;  Location: [facility];  Service: Orthopedics;  Laterality: Right;  . TOTAL HIP ARTHROPLASTY Left 03/16/2015   Procedure: LEFT TOTAL HIP ARTHROPLASTY ANTERIOR APPROACH;  Surgeon: Mcarthur Rossetti, MD;  Location: [facility];  Service: Orthopedics;  Laterality: Left;   Social History   Occupational History  . Not on file  Tobacco Use  . Smoking status: Never Smoker  . Smokeless tobacco: Never Used  Vaping Use  . Vaping Use:  Never used  Substance and Sexual Activity  . Alcohol use: Yes    Alcohol/week: 14.0 standard drinks    Types: 14 Glasses of wine per week    Comment: 1-2 glasses of wine daily  . Drug use: No  . Sexual activity: Not on file

## 2019-08-29 ENCOUNTER — Encounter: Payer: Self-pay | Admitting: Cardiology

## 2019-08-29 NOTE — Telephone Encounter (Signed)
Error

## 2019-09-01 DIAGNOSIS — E039 Hypothyroidism, unspecified: Secondary | ICD-10-CM | POA: Diagnosis not present

## 2019-09-01 DIAGNOSIS — R7303 Prediabetes: Secondary | ICD-10-CM | POA: Diagnosis not present

## 2019-09-04 DIAGNOSIS — E559 Vitamin D deficiency, unspecified: Secondary | ICD-10-CM | POA: Diagnosis not present

## 2019-09-04 DIAGNOSIS — I10 Essential (primary) hypertension: Secondary | ICD-10-CM | POA: Diagnosis not present

## 2019-09-04 DIAGNOSIS — E039 Hypothyroidism, unspecified: Secondary | ICD-10-CM | POA: Diagnosis not present

## 2019-09-04 DIAGNOSIS — R7303 Prediabetes: Secondary | ICD-10-CM | POA: Diagnosis not present

## 2019-09-29 ENCOUNTER — Telehealth: Payer: Self-pay | Admitting: *Deleted

## 2019-09-29 DIAGNOSIS — Z7184 Encounter for health counseling related to travel: Secondary | ICD-10-CM | POA: Diagnosis not present

## 2019-09-29 MED ORDER — DILTIAZEM HCL ER COATED BEADS 120 MG PO CP24: ORAL_CAPSULE | ORAL | 0 refills | Status: DC

## 2019-09-29 NOTE — Telephone Encounter (Signed)
Pt called requesting a refill for Cardizem.  Pt was overdue for an appt, She did schedule to see Truitt Merle, NP 10/21/2019.

## 2019-09-30 ENCOUNTER — Telehealth: Payer: Self-pay

## 2019-09-30 NOTE — Telephone Encounter (Signed)
Patient called in advised that she has not had her ref for pt , said blackman was supposed to put in ref not hilts.... wats to get PT started asap.

## 2019-09-30 NOTE — Telephone Encounter (Signed)
LMOM for patient letting her know Ninfa Linden gave her a Rx for [facility] PT, she is to call and make her own appt

## 2019-10-12 NOTE — Progress Notes (Signed)
CARDIOLOGY OFFICE NOTE   Date:  10/21/2019    Sarah Blackwell Date of Birth: 1955/11/24 Medical Record #[id number]  PCP:  Sarah Caraway, Blackwell  Cardiologist:  Sarah Blackwell   Chief Complaint  Patient presents with  . Follow-up    Seen for Sarah Blackwell    History of Present Illness: Sarah Blackwell is a 64 y.o. female who presents today for a follow up visit. Seen for Sarah Blackwell.   She has a history of PAF - on Flecainide, OSA - on CPAP (followed by Sarah Blackwell).   Last seen by Sarah Blackwell in June of 2020 for a telehealth visit.   Comes in today. Here alone. BP is high here today. She has been using NSAID. Little headache recently that she thought was more allergies. No chest pain. No AF - no significant palpitations. Her primary concern is that her hair is falling out. She is seeing Sarah Blackwell - she has been placed on Proscar - has follow up next month. Wondering if some of our medicines are the culprit. She notes that after she turned 62 "she fell apart". No real exercise program. Thyroid checked recently and was ok.     Past Medical History:  Diagnosis Date  . Allergic rhinitis   . Anxiety   . Arthritis   . Cancer ([place])    skin cancer  . Complication of anesthesia    difficulty waking up after surgery as a child.   . Fluid retention   . Headache    migraines when she was younger  . Hyperlipidemia   . Hypertension    never has taken medications  . Hypothyroidism   . PCOS (polycystic ovarian syndrome)   . Perimenopausal   . PONV (postoperative nausea and vomiting)   . Pre-diabetes   . Rosacea   . Wears glasses     Past Surgical History:  Procedure Laterality Date  . COLONOSCOPY    . FERTILITY SURGERY    . MOHS SURGERY     x 2  . TONSILLECTOMY    . TOTAL HIP ARTHROPLASTY Right 06/30/2014   Procedure: RIGHT TOTAL HIP ARTHROPLASTY ANTERIOR APPROACH;  Surgeon: Sarah Blackwell;  [facility]: [facility];  Service: Sarah Blackwell;  Laterality: Right;  . TOTAL HIP  ARTHROPLASTY Left 03/16/2015   Procedure: LEFT TOTAL HIP ARTHROPLASTY ANTERIOR APPROACH;  Surgeon: Sarah Blackwell;  [facility]: [facility];  Service: Sarah Blackwell;  Laterality: Left;     Medications: Current Meds  Medication Sig  . ALPRAZolam (XANAX) 0.5 MG tablet Take 0.5 mg by mouth at bedtime as needed for sleep.   Marland Kitchen amoxicillin (AMOXIL) 500 MG capsule Take 4 capsules by mouth as needed (Before dental work).   . Cholecalciferol (VITAMIN D3 PO) Take 5,000 Units by mouth daily.  . diclofenac (VOLTAREN) 75 MG EC tablet Take 1 tablet (75 mg total) by mouth 2 (two) times daily as needed.  . diltiazem (CARTIA XT) 120 MG 24 hr capsule TAKE ONE CAPSULE BY MOUTH DAILY AND AS NEEDED UP TO TWO PER DAY FOR EPISODES OF AFIB  . etodolac (LODINE) 500 MG tablet Take 1 tablet (500 mg total) by mouth 2 (two) times daily between meals as needed.  . flecainide (TAMBOCOR) 50 MG tablet TAKE ONE TABLET BY MOUTH TWICE A DAY *MUST MAKE YEARLY APPOINTMENT WITH Sarah Blackwell*  . fluticasone (FLONASE) 50 MCG/ACT nasal spray Place 1-2 sprays into both nostrils daily as needed for allergies.  Marland Kitchen ibuprofen (ADVIL,MOTRIN) 200 MG tablet  Take 200 mg by mouth every 6 (six) hours as needed for moderate pain.  Marland Kitchen levothyroxine (SYNTHROID, LEVOTHROID) 75 MCG tablet Take 75 mcg by mouth daily before breakfast.  . liothyronine (CYTOMEL) 5 MCG tablet Take 5 mcg by mouth daily.  Marland Kitchen losartan (COZAAR) 50 MG tablet Take 50 mg by mouth daily.  . Melatonin 10 MG TABS Take by mouth at bedtime.  . metFORMIN (GLUCOPHAGE-XR) 500 MG 24 hr tablet Take 500 mg by mouth at bedtime.   . nabumetone (RELAFEN) 750 MG tablet Take 1 tablet (750 mg total) by mouth 2 (two) times daily as needed.  . Omega-3 Fatty Acids (OMEGA-3 FISH OIL PO) Take by mouth daily.  . rosuvastatin (CRESTOR) 10 MG tablet Take 10 mg by mouth daily.  . [DISCONTINUED] flecainide (TAMBOCOR) 50 MG tablet TAKE ONE TABLET BY MOUTH TWICE A DAY *MUST MAKE YEARLY APPOINTMENT WITH DR.  Marlou Blackwell*     Allergies: Allergies  Allergen Reactions  . Sulfa Antibiotics Other (See Comments)    unspecified    Social History: The patient  reports that she has never smoked. She has never used smokeless tobacco. She reports current alcohol use of about 14.0 standard drinks of alcohol per week. She reports that she does not use drugs.   Family History: The patient's family history includes Alcohol abuse in her paternal grandfather; Anemia in her mother; CVA in her father; Cancer in her father, maternal grandfather, paternal grandfather, and another family member; Heart attack in her maternal grandfather; Hyperlipidemia in her father, mother, and another family member; Hypertension in her mother and another family member; Stroke in her father and another family member.   Review of Systems: Please see the history of present illness.   All other systems are reviewed and negative.   Physical Exam: VS:  BP (!) 142/100   Pulse 64   Ht 5\' 8"  (1.727 m)   Wt 209 lb (94.8 kg)   SpO2 96%   BMI 31.78 kg/m  .  BMI Body mass index is 31.78 kg/m.  Wt Readings from Last 3 Encounters:  10/21/19 209 lb (94.8 kg)  12/24/18 188 lb (85.3 kg)  10/04/18 215 lb (97.5 kg)   Recheck of her BP is 150/90 by me.  General: Alert and in no acute distress.   Cardiac: Regular rate and rhythm. No murmurs, rubs, or gallops. No edema.  Respiratory:  Lungs are clear to auscultation bilaterally with normal work of breathing.  GI: Soft and nontender.  MS: No deformity or atrophy. Gait and ROM intact.  Skin: Warm and dry. Color is normal.  Neuro:  Strength and sensation are intact and no gross focal deficits noted.  Psych: Alert, appropriate and with normal affect.   LABORATORY DATA:  EKG:  EKG is ordered today.  Personally reviewed by me. This demonstrates NSR.  Lab Results  Component Value Date   WBC 5.2 09/26/2016   HGB 14.5 09/26/2016   HCT 42.7 09/26/2016   PLT 207 09/26/2016   GLUCOSE 161  (H) 09/26/2016   NA 141 09/26/2016   K 3.5 09/26/2016   CL 106 09/26/2016   CREATININE 0.69 09/26/2016   BUN 12 09/26/2016   CO2 28 09/26/2016   TSH 1.244 09/29/2016       BNP (last 3 results) No results for input(s): BNP in the last 8760 hours.  ProBNP (last 3 results) No results for input(s): PROBNP in the last 8760 hours.   Other Studies Reviewed Today:  GXT Study Highlights 12/2017  Blood pressure demonstrated a hypertensive response to exercise.  There was no ST segment deviation noted during stress.   1. Hypertensive BP response.  2. Normal exercise tolerance.  3. No evidence for ischemia by ST segment analysis.    Echo Study Conclusions 09/2016  - Left ventricle: The cavity size was normal. Wall thickness was  normal. Systolic function was normal. The estimated ejection  fraction was in the range of 55% to 60%. Wall motion was normal;  there were no regional wall motion abnormalities.  - Mitral valve: Mild prolapse, involving the anterior leaflet.  There was trivial regurgitation.  - Left atrium: The atrium was normal in size.    ASSESSMENT & PLAN:    1. PAF - remains in sinus - remains on Flecainide and CCB therapy. No real issues noted.   2. OSA - followed by Sarah Blackwell.   3. HTN - BP not controlled - she has not been checking at home - she will start - advised to limit the NSAID use - call/message [place] with an update in about 1 week.   4. Hair loss - doubtful from our medicines - but ok to take drug holiday from her statin if she wishes. She is not on beta blocker. Will see if Sarah Blackwell has other recommendations. She follows back up with dermatology next month.   5. Obesity - she was successful with Optiva - not able to stay on long term - discussed being more "habit" focused. Exercise is encouraged - she is limited by her orthopedic issues.   6. Mild MVP  Current medicines are reviewed with the patient today.  The patient does not have  concerns regarding medicines other than what has been noted above.  The following changes have been made:  See above.  Labs/ tests ordered today include:    Orders Placed This Encounter  Procedures  . EKG 12-Lead     Disposition:   FU with Sarah Blackwell in 4 months. Monitor BP at home - may need further increase in her ARB therapy.    Patient is agreeable to this plan and will call if any problems develop in the interim.   SignedTruitt Merle, NP  10/21/2019 11:31 AM  [facility] [address] [place], [place]  [address] Phone: [phone] Fax: [phone]

## 2019-10-15 ENCOUNTER — Ambulatory Visit: Payer: Self-pay | Admitting: Orthopaedic Surgery

## 2019-10-21 ENCOUNTER — Other Ambulatory Visit: Payer: Self-pay

## 2019-10-21 ENCOUNTER — Ambulatory Visit (INDEPENDENT_AMBULATORY_CARE_PROVIDER_SITE_OTHER): Payer: BC Managed Care – PPO | Admitting: Nurse Practitioner

## 2019-10-21 ENCOUNTER — Encounter: Payer: Self-pay | Admitting: Nurse Practitioner

## 2019-10-21 VITALS — BP 142/100 | HR 64 | Ht 68.0 in | Wt 209.0 lb

## 2019-10-21 DIAGNOSIS — I480 Paroxysmal atrial fibrillation: Secondary | ICD-10-CM

## 2019-10-21 DIAGNOSIS — I10 Essential (primary) hypertension: Secondary | ICD-10-CM | POA: Diagnosis not present

## 2019-10-21 DIAGNOSIS — G4733 Obstructive sleep apnea (adult) (pediatric): Secondary | ICD-10-CM

## 2019-10-21 DIAGNOSIS — L659 Nonscarring hair loss, unspecified: Secondary | ICD-10-CM

## 2019-10-21 MED ORDER — FLECAINIDE ACETATE 50 MG PO TABS: ORAL_TABLET | ORAL | 2 refills | Status: DC

## 2019-10-21 NOTE — Patient Instructions (Addendum)
After Visit Summary:  We will be checking the following labs today - NONE   Medication Instructions:    Continue with your current medicines.    If you need a refill on your cardiac medications before your next appointment, please call your pharmacy.     Testing/Procedures To Be Arranged:  N/A  Follow-Up:   See Dr. Marlou Porch in about 4 months    At [facility], you and your health needs are our priority.  As part of our continuing mission to provide you with exceptional heart care, we have created designated Provider Care Teams.  These Care Teams include your primary Cardiologist (physician) and Advanced Practice Providers (APPs -  Physician Assistants and Nurse Practitioners) who all work together to provide you with the care you need, when you need it.  Special Instructions:  . Stay safe, wash your hands for at least 20 seconds and wear a mask when needed.  . It was good to talk with you today.  . Think about what we talked about today . Monitor your BP at home - call us with an update in about a week or so - or send [place] a message thru My Chart.  . Would try to minimize the NSAID use (Voltaren, Lodine, etc)   Call the [place] office at [phone] if you have any questions, problems or concerns.

## 2019-11-12 ENCOUNTER — Telehealth: Payer: Self-pay | Admitting: Cardiology

## 2019-11-12 DIAGNOSIS — L649 Androgenic alopecia, unspecified: Secondary | ICD-10-CM | POA: Diagnosis not present

## 2019-11-12 NOTE — Telephone Encounter (Signed)
Patient calling with BP readings and Pulse rates  125/77; 85 120/77; 89 113/79; 82 139/91; 80 116/79; 77 143/96; 76 138/92; 77 137/79; 64 144/77; 64 115/78; 62 127/78; 69 145/86

## 2019-11-12 NOTE — Telephone Encounter (Signed)
Overall readings look ok.  Would stay on current regimen. Limit use of NSAIDs Monitor occasionally. See back as planned.   Cecille Rubin

## 2019-11-25 DIAGNOSIS — G4733 Obstructive sleep apnea (adult) (pediatric): Secondary | ICD-10-CM | POA: Diagnosis not present

## 2020-01-16 DIAGNOSIS — U071 COVID-19: Secondary | ICD-10-CM | POA: Diagnosis not present

## 2020-01-21 DIAGNOSIS — L649 Androgenic alopecia, unspecified: Secondary | ICD-10-CM | POA: Diagnosis not present

## 2020-02-26 DIAGNOSIS — I10 Essential (primary) hypertension: Secondary | ICD-10-CM | POA: Diagnosis not present

## 2020-02-26 DIAGNOSIS — R7303 Prediabetes: Secondary | ICD-10-CM | POA: Diagnosis not present

## 2020-02-26 DIAGNOSIS — E559 Vitamin D deficiency, unspecified: Secondary | ICD-10-CM | POA: Diagnosis not present

## 2020-02-26 DIAGNOSIS — E039 Hypothyroidism, unspecified: Secondary | ICD-10-CM | POA: Diagnosis not present

## 2020-03-03 DIAGNOSIS — D2262 Melanocytic nevi of left upper limb, including shoulder: Secondary | ICD-10-CM | POA: Diagnosis not present

## 2020-03-03 DIAGNOSIS — D225 Melanocytic nevi of trunk: Secondary | ICD-10-CM | POA: Diagnosis not present

## 2020-03-03 DIAGNOSIS — D485 Neoplasm of uncertain behavior of skin: Secondary | ICD-10-CM | POA: Diagnosis not present

## 2020-03-03 DIAGNOSIS — L821 Other seborrheic keratosis: Secondary | ICD-10-CM | POA: Diagnosis not present

## 2020-03-03 DIAGNOSIS — D2261 Melanocytic nevi of right upper limb, including shoulder: Secondary | ICD-10-CM | POA: Diagnosis not present

## 2020-03-04 DIAGNOSIS — Z0000 Encounter for general adult medical examination without abnormal findings: Secondary | ICD-10-CM | POA: Diagnosis not present

## 2020-03-04 DIAGNOSIS — I10 Essential (primary) hypertension: Secondary | ICD-10-CM | POA: Diagnosis not present

## 2020-03-04 DIAGNOSIS — E039 Hypothyroidism, unspecified: Secondary | ICD-10-CM | POA: Diagnosis not present

## 2020-03-04 DIAGNOSIS — Z23 Encounter for immunization: Secondary | ICD-10-CM | POA: Diagnosis not present

## 2020-03-04 DIAGNOSIS — R7303 Prediabetes: Secondary | ICD-10-CM | POA: Diagnosis not present

## 2020-03-04 DIAGNOSIS — Z1389 Encounter for screening for other disorder: Secondary | ICD-10-CM | POA: Diagnosis not present

## 2020-03-04 DIAGNOSIS — E782 Mixed hyperlipidemia: Secondary | ICD-10-CM | POA: Diagnosis not present

## 2020-03-26 ENCOUNTER — Ambulatory Visit (INDEPENDENT_AMBULATORY_CARE_PROVIDER_SITE_OTHER): Payer: BC Managed Care – PPO | Admitting: Cardiology

## 2020-03-26 ENCOUNTER — Encounter: Payer: Self-pay | Admitting: Cardiology

## 2020-03-26 ENCOUNTER — Other Ambulatory Visit: Payer: Self-pay

## 2020-03-26 VITALS — BP 110/70 | HR 77 | Ht 68.0 in | Wt 216.0 lb

## 2020-03-26 DIAGNOSIS — I480 Paroxysmal atrial fibrillation: Secondary | ICD-10-CM | POA: Diagnosis not present

## 2020-03-26 DIAGNOSIS — Z8249 Family history of ischemic heart disease and other diseases of the circulatory system: Secondary | ICD-10-CM

## 2020-03-26 DIAGNOSIS — G4733 Obstructive sleep apnea (adult) (pediatric): Secondary | ICD-10-CM

## 2020-03-26 NOTE — Progress Notes (Signed)
Cardiology Office Note:    Date:  03/26/2020   ID:  Sarah Blackwell, DOB [birth year], MRN [id number]  PCP:  Cari Caraway, MD   [place]  Cardiologist:  Candee Furbish, MD  Advanced Practice Provider:  No care team member to display Electrophysiologist:  None       Referring MD: Cari Caraway, MD    History of Present Illness:    Sarah Blackwell is a 65 y.o. female   Past Medical History:  Diagnosis Date  . Allergic rhinitis   . Anxiety   . Arthritis   . Cancer ([place])    skin cancer  . Complication of anesthesia    difficulty waking up after surgery as a child.   . Fluid retention   . Headache    migraines when she was younger  . Hyperlipidemia   . Hypertension    never has taken medications  . Hypothyroidism   . PCOS (polycystic ovarian syndrome)   . Perimenopausal   . PONV (postoperative nausea and vomiting)   . Pre-diabetes   . Rosacea   . Wears glasses     Past Surgical History:  Procedure Laterality Date  . COLONOSCOPY    . FERTILITY SURGERY    . MOHS SURGERY     x 2  . TONSILLECTOMY    . TOTAL HIP ARTHROPLASTY Right 06/30/2014   Procedure: RIGHT TOTAL HIP ARTHROPLASTY ANTERIOR APPROACH;  Surgeon: Mcarthur Rossetti, MD;  Location: [place] [facility];  Service: Orthopedics;  Laterality: Right;  . TOTAL HIP ARTHROPLASTY Left 03/16/2015   Procedure: LEFT TOTAL HIP ARTHROPLASTY ANTERIOR APPROACH;  Surgeon: Mcarthur Rossetti, MD;  Location: [facility];  Service: Orthopedics;  Laterality: Left;    Current Medications: Current Meds  Medication Sig  . ALPRAZolam (XANAX) 0.5 MG tablet Take 0.5 mg by mouth at bedtime as needed for sleep.   Marland Kitchen amoxicillin (AMOXIL) 500 MG capsule Take 4 capsules by mouth as needed (Before dental work).   . Cholecalciferol (VITAMIN D3 PO) Take 5,000 Units by mouth daily.  . diclofenac (VOLTAREN) 75 MG EC tablet Take 1 tablet (75 mg total) by mouth 2 (two) times daily as needed.  . diltiazem (CARTIA XT) 120 MG 24 hr  capsule TAKE ONE CAPSULE BY MOUTH DAILY AND AS NEEDED UP TO TWO PER DAY FOR EPISODES OF AFIB  . etodolac (LODINE) 500 MG tablet Take 1 tablet (500 mg total) by mouth 2 (two) times daily between meals as needed.  . finasteride (PROSCAR) 5 MG tablet Take 5 mg by mouth daily.  . flecainide (TAMBOCOR) 50 MG tablet TAKE ONE TABLET BY MOUTH TWICE A DAY *MUST MAKE YEARLY APPOINTMENT WITH DR. Marlou Porch*  . fluticasone (FLONASE) 50 MCG/ACT nasal spray Place 1-2 sprays into both nostrils daily as needed for allergies.  Marland Kitchen ibuprofen (ADVIL,MOTRIN) 200 MG tablet Take 200 mg by mouth every 6 (six) hours as needed for moderate pain.  Marland Kitchen levothyroxine (SYNTHROID, LEVOTHROID) 75 MCG tablet Take 75 mcg by mouth daily before breakfast.  . liothyronine (CYTOMEL) 5 MCG tablet Take 5 mcg by mouth daily.  Marland Kitchen losartan (COZAAR) 50 MG tablet Take 50 mg by mouth daily.  . Melatonin 10 MG TABS Take by mouth at bedtime.  . metFORMIN (GLUCOPHAGE-XR) 500 MG 24 hr tablet Take 500 mg by mouth at bedtime.   . nabumetone (RELAFEN) 750 MG tablet Take 1 tablet (750 mg total) by mouth 2 (two) times daily as needed.  . Omega-3 Fatty Acids (OMEGA-3 FISH OIL  PO) Take by mouth daily.  . rosuvastatin (CRESTOR) 10 MG tablet Take 10 mg by mouth daily.     Allergies:   Sulfa antibiotics   Social History   Socioeconomic History  . Marital status: Married    Spouse name: Not on file  . Number of children: Not on file  . Years of education: Not on file  . Highest education level: Not on file  Occupational History  . Not on file  Tobacco Use  . Smoking status: Never Smoker  . Smokeless tobacco: Never Used  Vaping Use  . Vaping Use: Never used  Substance and Sexual Activity  . Alcohol use: Yes    Alcohol/week: 14.0 standard drinks    Types: 14 Glasses of wine per week    Comment: 1-2 glasses of wine daily  . Drug use: No  . Sexual activity: Not on file  Other Topics Concern  . Not on file  Social History Narrative  . Not on file    Social Determinants of Health   Financial Resource Strain: Not on file  Food Insecurity: Not on file  Transportation Needs: Not on file  Physical Activity: Not on file  Stress: Not on file  Social Connections: Not on file     Family History: The patient's family history includes Alcohol abuse in her paternal grandfather; Anemia in her mother; CVA in her father; Cancer in her father, maternal grandfather, paternal grandfather, and another family member; Heart attack in her maternal grandfather; Hyperlipidemia in her father, mother, and another family member; Hypertension in her mother and another family member; Stroke in her father and another family member.  ROS:   Please see the history of present illness.     All other systems reviewed and are negative.  EKGs/Labs/Other Studies Reviewed:      Recent Labs: No results found for requested labs within last 8760 hours.  Recent Lipid Panel No results found for: CHOL, TRIG, HDL, CHOLHDL, VLDL, LDLCALC, LDLDIRECT   Risk Assessment/Calculations:      Physical Exam:    VS:  BP 110/70 (BP Location: Left Arm, Patient Position: Sitting, Cuff Size: Normal)   Pulse 77   Ht 5\' 8"  (1.727 m)   Wt 216 lb (98 kg)   SpO2 96%   BMI 32.84 kg/m     Wt Readings from Last 3 Encounters:  03/26/20 216 lb (98 kg)  10/21/19 209 lb (94.8 kg)  12/24/18 188 lb (85.3 kg)     GEN:  Well nourished, well developed in no acute distress HEENT: Normal NECK: No JVD; No carotid bruits LYMPHATICS: No lymphadenopathy CARDIAC: RRR, no murmurs, rubs, gallops RESPIRATORY:  Clear to auscultation without rales, wheezing or rhonchi  ABDOMEN: Soft, non-tender, non-distended MUSCULOSKELETAL:  No edema; No deformity  SKIN: Warm and dry NEUROLOGIC:  Alert and oriented x 3 PSYCHIATRIC:  Normal affect   ASSESSMENT:    1. Paroxysmal atrial fibrillation (HCC)   2. OSA (obstructive sleep apnea)   3. Family history of coronary artery disease    PLAN:     In order of problems listed above:  Family history of CAD -Recently had a cousin her same age that had a heart attack and multiple vessels disease. -We will check a coronary calcium score, $99.  If calcified plaque is noted, will intensify statin therapy and add low-dose aspirin.  Paroxysmal atrial fibrillation -On flecainide.  Doing well. Rare episode. Wine with dehydration can trigger.   Obstructive sleep apnea -On CPAP.  Followed  by Dr. Radford Pax.  Essential hypertension -Challenging to control BP.  Hair loss -She is not on beta-blocker.  She was told that she could try a holiday from statin if she wishes.  She was going to discuss further with Dermatology as well.  Mild MVP -Trace MR - next year ok to check ECHO       Medication Adjustments/Labs and Tests Ordered: Current medicines are reviewed at length with the patient today.  Concerns regarding medicines are outlined above.  Orders Placed This Encounter  Procedures  . CT CARDIAC SCORING (SELF PAY ONLY)   No orders of the defined types were placed in this encounter.   Patient Instructions  Medication Instructions:  The current medical regimen is effective;  continue present plan and medications.  *If you need a refill on your cardiac medications before your next appointment, please call your pharmacy*  Testing/Procedures: Your physician has requested that you have Coronary Calcium score which is completed by CT. Cardiac computed tomography (CT) is a painless test that uses an x-ray machine to take clear, detailed pictures of your heart. This test is completed here at this office.  There are no restrictions or instructions.  Follow-Up: At [facility], you and your health needs are our priority.  As part of our continuing mission to provide you with exceptional heart care, we have created designated Provider Care Teams.  These Care Teams include your primary Cardiologist (physician) and Advanced Practice Providers  (APPs -  Physician Assistants and Nurse Practitioners) who all work together to provide you with the care you need, when you need it.  We recommend signing up for the patient portal called "MyChart".  Sign up information is provided on this After Visit Summary.  MyChart is used to connect with patients for Virtual Visits (Telemedicine).  Patients are able to view lab/test results, encounter notes, upcoming appointments, etc.  Non-urgent messages can be sent to your provider as well.   To learn more about what you can do with MyChart, go to [email or web address].    Your next appointment:   6 month(s)  The format for your next appointment:   In Person  Provider:   Candee Furbish, MD   Thank you for choosing [facility]!!        Signed, Candee Furbish, MD  03/26/2020 4:43 PM    [place]

## 2020-03-26 NOTE — Patient Instructions (Signed)
Medication Instructions:  The current medical regimen is effective;  continue present plan and medications.  *If you need a refill on your cardiac medications before your next appointment, please call your pharmacy*  Testing/Procedures: Your physician has requested that you have Coronary Calcium score which is completed by CT. Cardiac computed tomography (CT) is a painless test that uses an x-ray machine to take clear, detailed pictures of your heart. This test is completed here at this office.  There are no restrictions or instructions.  Follow-Up: At [facility], you and your health needs are our priority.  As part of our continuing mission to provide you with exceptional heart care, we have created designated Provider Care Teams.  These Care Teams include your primary Cardiologist (physician) and Advanced Practice Providers (APPs -  Physician Assistants and Nurse Practitioners) who all work together to provide you with the care you need, when you need it.  We recommend signing up for the patient portal called "MyChart".  Sign up information is provided on this After Visit Summary.  MyChart is used to connect with patients for Virtual Visits (Telemedicine).  Patients are able to view lab/test results, encounter notes, upcoming appointments, etc.  Non-urgent messages can be sent to your provider as well.   To learn more about what you can do with MyChart, go to [email or web address].    Your next appointment:   6 month(s)  The format for your next appointment:   In Person  Provider:   Candee Furbish, MD   Thank you for choosing [facility]!!

## 2020-04-20 ENCOUNTER — Ambulatory Visit (INDEPENDENT_AMBULATORY_CARE_PROVIDER_SITE_OTHER)
Admission: RE | Admit: 2020-04-20 | Discharge: 2020-04-20 | Disposition: A | Discharge status: Home / Self Care | Payer: Self-pay | Source: Ambulatory Visit | Attending: Cardiology | Admitting: Cardiology

## 2020-04-20 ENCOUNTER — Other Ambulatory Visit: Payer: Self-pay

## 2020-04-20 DIAGNOSIS — I480 Paroxysmal atrial fibrillation: Secondary | ICD-10-CM

## 2020-04-20 DIAGNOSIS — Z8249 Family history of ischemic heart disease and other diseases of the circulatory system: Secondary | ICD-10-CM

## 2020-04-21 ENCOUNTER — Inpatient Hospital Stay: Admission: RE | Admit: 2020-04-21 | Payer: Self-pay | Source: Ambulatory Visit

## 2020-04-22 ENCOUNTER — Telehealth: Payer: Self-pay | Admitting: *Deleted

## 2020-04-22 DIAGNOSIS — Z79899 Other long term (current) drug therapy: Secondary | ICD-10-CM

## 2020-04-22 DIAGNOSIS — R931 Abnormal findings on diagnostic imaging of heart and coronary circulation: Secondary | ICD-10-CM

## 2020-04-22 MED ORDER — ROSUVASTATIN CALCIUM 20 MG PO TABS: 20.0000 mg | ORAL_TABLET | Freq: Every day | ORAL | 3 refills | Status: AC

## 2020-04-22 NOTE — Telephone Encounter (Signed)
Calcified plaque in the left anterior descending artery with calcium score of 22, which places her at 58th percentile.  Currently on Crestor 10 mg. It is reasonable continue with this however to obtain more protection, increasing to Crestor 20 mg a day, high intensity dose, would likely reduce risk.   If she decides to increase to 20 mg a day, repeat lipid panel in 3 months with ALT. She should tolerate it just fine since she is on 10 mg.   Sarah Furbish, MD    Pt is aware of results and is increasing Crestor to 20 mg a day.  She is scheduled for repeat lab 07/21/2020

## 2020-04-29 DIAGNOSIS — L649 Androgenic alopecia, unspecified: Secondary | ICD-10-CM | POA: Diagnosis not present

## 2020-06-08 ENCOUNTER — Other Ambulatory Visit: Payer: Self-pay

## 2020-06-08 MED ORDER — DILTIAZEM HCL ER COATED BEADS 120 MG PO CP24: ORAL_CAPSULE | ORAL | 3 refills | Status: DC

## 2020-07-28 ENCOUNTER — Other Ambulatory Visit: Payer: BC Managed Care – PPO

## 2020-07-28 ENCOUNTER — Other Ambulatory Visit: Payer: Self-pay

## 2020-07-28 DIAGNOSIS — Z79899 Other long term (current) drug therapy: Secondary | ICD-10-CM | POA: Diagnosis not present

## 2020-07-28 DIAGNOSIS — R931 Abnormal findings on diagnostic imaging of heart and coronary circulation: Secondary | ICD-10-CM

## 2020-07-28 LAB — ALT: ALT: 36 IU/L — ABNORMAL HIGH (ref 0–32)

## 2020-07-28 LAB — LIPID PANEL
Chol/HDL Ratio: 2.9 ratio (ref 0.0–4.4)
Cholesterol, Total: 158 mg/dL (ref 100–199)
HDL: 54 mg/dL (ref >39)
LDL Chol Calc (NIH): 73 mg/dL (ref 0–99)
Triglycerides: 185 mg/dL — ABNORMAL HIGH (ref 0–149)
VLDL Cholesterol Cal: 31 mg/dL (ref 5–40)

## 2020-08-30 ENCOUNTER — Other Ambulatory Visit: Payer: Self-pay | Admitting: *Deleted

## 2020-08-30 MED ORDER — FLECAINIDE ACETATE 50 MG PO TABS: ORAL_TABLET | ORAL | 1 refills | Status: DC

## 2020-09-02 DIAGNOSIS — R7301 Impaired fasting glucose: Secondary | ICD-10-CM | POA: Diagnosis not present

## 2020-09-02 DIAGNOSIS — E782 Mixed hyperlipidemia: Secondary | ICD-10-CM | POA: Diagnosis not present

## 2020-09-02 DIAGNOSIS — E039 Hypothyroidism, unspecified: Secondary | ICD-10-CM | POA: Diagnosis not present

## 2020-09-02 DIAGNOSIS — E559 Vitamin D deficiency, unspecified: Secondary | ICD-10-CM | POA: Diagnosis not present

## 2020-09-09 DIAGNOSIS — I10 Essential (primary) hypertension: Secondary | ICD-10-CM | POA: Diagnosis not present

## 2020-09-09 DIAGNOSIS — I2510 Atherosclerotic heart disease of native coronary artery without angina pectoris: Secondary | ICD-10-CM | POA: Diagnosis not present

## 2020-09-09 DIAGNOSIS — Z23 Encounter for immunization: Secondary | ICD-10-CM | POA: Diagnosis not present

## 2020-09-09 DIAGNOSIS — E669 Obesity, unspecified: Secondary | ICD-10-CM | POA: Diagnosis not present

## 2020-09-29 ENCOUNTER — Encounter: Payer: Self-pay | Admitting: Cardiology

## 2020-09-29 ENCOUNTER — Other Ambulatory Visit: Payer: Self-pay

## 2020-09-29 ENCOUNTER — Ambulatory Visit (INDEPENDENT_AMBULATORY_CARE_PROVIDER_SITE_OTHER): Payer: BC Managed Care – PPO | Admitting: Cardiology

## 2020-09-29 VITALS — BP 110/80 | HR 62 | Ht 68.0 in | Wt 210.4 lb

## 2020-09-29 DIAGNOSIS — I2584 Coronary atherosclerosis due to calcified coronary lesion: Secondary | ICD-10-CM

## 2020-09-29 DIAGNOSIS — I2510 Atherosclerotic heart disease of native coronary artery without angina pectoris: Secondary | ICD-10-CM

## 2020-09-29 DIAGNOSIS — I059 Rheumatic mitral valve disease, unspecified: Secondary | ICD-10-CM

## 2020-09-29 DIAGNOSIS — I480 Paroxysmal atrial fibrillation: Secondary | ICD-10-CM | POA: Insufficient documentation

## 2020-09-29 NOTE — Progress Notes (Signed)
Cardiology Office Note:    Date:  09/29/2020   ID:  Sarah Blackwell, DOB 01/31/1955, MRN [id number]  PCP:  Cari Caraway, MD   [facility] Providers Cardiologist:  Candee Furbish, MD     Referring MD: Cari Caraway, MD     History of Present Illness:    Sarah Blackwell is a 65 y.o. female here for follow-up of paroxysmal atrial fibrillation on flecainide with rare episodes.  Also has essential hypertension, prior hair loss mild mitral valve prolapse obstructive sleep apnea and family history of CAD.  Overall doing well without any fevers chills nausea vomiting syncope bleeding.  Past Medical History:  Diagnosis Date   Allergic rhinitis    Anxiety    Arthritis    Cancer ([place])    skin cancer   Complication of anesthesia    difficulty waking up after surgery as a child.    Fluid retention    Headache    migraines when she was younger   Hyperlipidemia    Hypertension    never has taken medications   Hypothyroidism    PCOS (polycystic ovarian syndrome)    Perimenopausal    PONV (postoperative nausea and vomiting)    Pre-diabetes    Rosacea    Wears glasses     Past Surgical History:  Procedure Laterality Date   COLONOSCOPY     FERTILITY SURGERY     MOHS SURGERY     x 2   TONSILLECTOMY     TOTAL HIP ARTHROPLASTY Right 06/30/2014   Procedure: RIGHT TOTAL HIP ARTHROPLASTY ANTERIOR APPROACH;  Surgeon: Mcarthur Rossetti, MD;  Location: [place];  Service: Orthopedics;  Laterality: Right;   TOTAL HIP ARTHROPLASTY Left 03/16/2015   Procedure: LEFT TOTAL HIP ARTHROPLASTY ANTERIOR APPROACH;  Surgeon: Mcarthur Rossetti, MD;  Location: [place];  Service: Orthopedics;  Laterality: Left;    Current Medications: Current Meds  Medication Sig   diltiazem (CARTIA XT) 120 MG 24 hr capsule TAKE ONE CAPSULE BY MOUTH DAILY AND AS NEEDED UP TO TWO PER DAY FOR EPISODES OF AFIB   finasteride (PROSCAR) 5 MG tablet Take 5 mg by mouth daily.   flecainide (TAMBOCOR) 50 MG tablet TAKE  ONE TABLET BY MOUTH TWICE A DAY   levothyroxine (SYNTHROID, LEVOTHROID) 75 MCG tablet Take 75 mcg by mouth daily before breakfast.   liothyronine (CYTOMEL) 5 MCG tablet Take 5 mcg by mouth daily.   losartan (COZAAR) 50 MG tablet Take 50 mg by mouth daily.   Melatonin 10 MG TABS Take by mouth at bedtime.   metFORMIN (GLUCOPHAGE-XR) 500 MG 24 hr tablet Take 1,500 mg by mouth at bedtime. Pt take three tablet with largest meal once daily   Omega-3 Fatty Acids (OMEGA-3 FISH OIL PO) Take by mouth daily.   rosuvastatin (CRESTOR) 20 MG tablet Take 1 tablet (20 mg total) by mouth daily.     Allergies:   Sulfa antibiotics   Social History   Socioeconomic History   Marital status: Married    Spouse name: Not on file   Number of children: Not on file   Years of education: Not on file   Highest education level: Not on file  Occupational History   Not on file  Tobacco Use   Smoking status: Never   Smokeless tobacco: Never  Vaping Use   Vaping Use: Never used  Substance and Sexual Activity   Alcohol use: Yes    Alcohol/week: 14.0 standard drinks    Types: 14 Glasses of  wine per week    Comment: 1-2 glasses of wine daily   Drug use: No   Sexual activity: Not on file  Other Topics Concern   Not on file  Social History Narrative   Not on file   Social Determinants of Health   Financial Resource Strain: Not on file  Food Insecurity: Not on file  Transportation Needs: Not on file  Physical Activity: Not on file  Stress: Not on file  Social Connections: Not on file     Family History: The patient's family history includes Alcohol abuse in her paternal grandfather; Anemia in her mother; CVA in her father; Cancer in her father, maternal grandfather, paternal grandfather, and another family member; Heart attack in her maternal grandfather; Hyperlipidemia in her father, mother, and another family member; Hypertension in her mother and another family member; Stroke in her father and another  family member.  ROS:   Please see the history of present illness.    Denies any fevers chills nausea vomiting syncope all other systems reviewed and are negative.  EKGs/Labs/Other Studies Reviewed:    The following studies were reviewed today:  Coronary calcium score 04/20/2020: Coronary calcium score of 22. This was 58th percentile for age-, race-, and sex-matched controls  ECHO 2018: - Left ventricle: The cavity size was normal. Wall thickness was    normal. Systolic function was normal. The estimated ejection    fraction was in the range of 55% to 60%. Wall motion was normal;    there were no regional wall motion abnormalities.  - Mitral valve: Mild prolapse, involving the anterior leaflet.    There was trivial regurgitation.  - Left atrium: The atrium was normal in size.   EKG:  EKG is  ordered today.  The ekg ordered today demonstrates SR 62 normal intervals  Recent Labs: 07/28/2020: ALT 36  Recent Lipid Panel    Component Value Date/Time   CHOL 158 07/28/2020 0842   TRIG 185 (H) 07/28/2020 0842   HDL 54 07/28/2020 0842   CHOLHDL 2.9 07/28/2020 0842   LDLCALC 73 07/28/2020 0842     Risk Assessment/Calculations:          Physical Exam:    VS:  BP 110/80   Pulse 62   Ht 5\' 8"  (1.727 m)   Wt 210 lb 6.4 oz (95.4 kg)   SpO2 96%   BMI 31.99 kg/m     Wt Readings from Last 3 Encounters:  09/29/20 210 lb 6.4 oz (95.4 kg)  03/26/20 216 lb (98 kg)  10/21/19 209 lb (94.8 kg)     GEN:  Well nourished, well developed in no acute distress HEENT: Normal NECK: No JVD; No carotid bruits LYMPHATICS: No lymphadenopathy CARDIAC: RRR, no murmurs, rubs, gallops RESPIRATORY:  Clear to auscultation without rales, wheezing or rhonchi  ABDOMEN: Soft, non-tender, non-distended MUSCULOSKELETAL:  No edema; No deformity  SKIN: Warm and dry NEUROLOGIC:  Alert and oriented x 3 PSYCHIATRIC:  Normal affect   ASSESSMENT:    1. Mitral valve disorder   2. PAF (paroxysmal atrial  fibrillation) ([place])   3. Coronary artery calcification    PLAN:    PAF (paroxysmal atrial fibrillation) (HCC) Currently on flecainide 50 mg twice a day as well as diltiazem CD1 120 mg once a day.  She has experienced increased episodes.  She notices this for instance when drinking 2 glasses of wine.  Structurally normal heart back in 2018.  We will repeat echocardiogram.  I think she would  be a prime candidate for atrial fibrillation ablation.  I will refer to electrophysiology for further discussion.  We discussed today in clinic as well.  If she does have an episode of atrial fibrillation, she could take an additional dose of her flecainide 50 mg.  She is not currently on chronic anticoagulation given her CHA2DS2-VASc score of 2 for female, hypertension.  She is on losartan 50 mg a day.  When she hits 15, she will have a score of 3 and require anticoagulation.  Coronary artery calcification Has family history of CAD.  Coronary artery calcium formed, 22 in the LAD.  58 percentile.  She is now on rosuvastatin 20 mg daily.  No myalgias.  Doing well.  She has had friends who have had sudden heart attacks and died.  In order of problems listed above:  Medication Adjustments/Labs and Tests Ordered: Current medicines are reviewed at length with the patient today.  Concerns regarding medicines are outlined above.  Orders Placed This Encounter  Procedures   Ambulatory referral to Cardiac Electrophysiology   EKG 12-Lead   ECHOCARDIOGRAM COMPLETE    No orders of the defined types were placed in this encounter.   Patient Instructions  Medication Instructions:   Your physician recommends that you continue on your current medications as directed. Please refer to the Current Medication list given to you today.   *If you need a refill on your cardiac medications before your next appointment, please call your pharmacy*   Lab Work: [place]   If you have labs (blood work) drawn today  and your tests are completely normal, you will receive your results only by: [place] (if you have MyChart) OR A paper copy in the mail If you have any lab test that is abnormal or we need to change your treatment, we will call you to review the results.   Testing/Procedures: Your physician has requested that you have an echocardiogram. Echocardiography is a painless test that uses sound waves to create images of your heart. It provides your doctor with information about the size and shape of your heart and how well your heart's chambers and valves are working. This procedure takes approximately one hour. There are no restrictions for this procedure.   Follow-Up: At [facility], you and your health needs are our priority.  As part of our continuing mission to provide you with exceptional heart care, we have created designated Provider Care Teams.  These Care Teams include your primary Cardiologist (physician) and Advanced Practice Providers (APPs -  Physician Assistants and Nurse Practitioners) who all work together to provide you with the care you need, when you need it.  We recommend signing up for the patient portal called "MyChart".  Sign up information is provided on this After Visit Summary.  MyChart is used to connect with patients for Virtual Visits (Telemedicine).  Patients are able to view lab/test results, encounter notes, upcoming appointments, etc.  Non-urgent messages can be sent to your provider as well.   To learn more about what you can do with MyChart, go to [email or web address].    Your next appointment:    You have been referred to Dr. Curt Bears (EP) [place]  to discuss ATRIAL FIBRILLATION/ABLATION  1 year(s)  The format for your next appointment:   In Person  Provider:   Candee Furbish, MD   Other Instructions     Signed, Candee Furbish, MD  09/29/2020 11:01 AM    [place]

## 2020-09-29 NOTE — Patient Instructions (Addendum)
Medication Instructions:   Your physician recommends that you continue on your current medications as directed. Please refer to the Current Medication list given to you today.   *If you need a refill on your cardiac medications before your next appointment, please call your pharmacy*   Lab Work: [place]   If you have labs (blood work) drawn today and your tests are completely normal, you will receive your results only by: [place] (if you have MyChart) OR A paper copy in the mail If you have any lab test that is abnormal or we need to change your treatment, we will call you to review the results.   Testing/Procedures: Your physician has requested that you have an echocardiogram. Echocardiography is a painless test that uses sound waves to create images of your heart. It provides your doctor with information about the size and shape of your heart and how well your heart's chambers and valves are working. This procedure takes approximately one hour. There are no restrictions for this procedure.   Follow-Up: At [facility], you and your health needs are our priority.  As part of our continuing mission to provide you with exceptional heart care, we have created designated Provider Care Teams.  These Care Teams include your primary Cardiologist (physician) and Advanced Practice Providers (APPs -  Physician Assistants and Nurse Practitioners) who all work together to provide you with the care you need, when you need it.  We recommend signing up for the patient portal called "MyChart".  Sign up information is provided on this After Visit Summary.  MyChart is used to connect with patients for Virtual Visits (Telemedicine).  Patients are able to view lab/test results, encounter notes, upcoming appointments, etc.  Non-urgent messages can be sent to your provider as well.   To learn more about what you can do with MyChart, go to [email or web address].    Your next appointment:     You have been referred to Dr. Curt Bears (EP) [place]  to discuss [place]  1 year(s)  The format for your next appointment:   In Person  Provider:   Candee Furbish, MD   Other Instructions

## 2020-09-29 NOTE — Assessment & Plan Note (Signed)
Currently on flecainide 50 mg twice a day as well as diltiazem CD1 120 mg once a day.  She has experienced increased episodes.  She notices this for instance when drinking 2 glasses of wine.  Structurally normal heart back in 2018.  We will repeat echocardiogram.  I think she would be a prime candidate for atrial fibrillation ablation.  I will refer to electrophysiology for further discussion.  We discussed today in clinic as well.  If she does have an episode of atrial fibrillation, she could take an additional dose of her flecainide 50 mg.  She is not currently on chronic anticoagulation given her CHA2DS2-VASc score of 2 for female, hypertension.  She is on losartan 50 mg a day.  When she hits 54, she will have a score of 3 and require anticoagulation.

## 2020-09-29 NOTE — Assessment & Plan Note (Signed)
Has family history of CAD.  Coronary artery calcium formed, 22 in the LAD.  58 percentile.  She is now on rosuvastatin 20 mg daily.  No myalgias.  Doing well.  She has had friends who have had sudden heart attacks and died.

## 2020-10-13 ENCOUNTER — Ambulatory Visit (HOSPITAL_COMMUNITY): Payer: BC Managed Care – PPO | Attending: Cardiovascular Disease

## 2020-10-13 ENCOUNTER — Other Ambulatory Visit: Payer: Self-pay

## 2020-10-13 DIAGNOSIS — I059 Rheumatic mitral valve disease, unspecified: Secondary | ICD-10-CM | POA: Diagnosis not present

## 2020-10-13 DIAGNOSIS — I480 Paroxysmal atrial fibrillation: Secondary | ICD-10-CM

## 2020-10-13 LAB — ECHOCARDIOGRAM COMPLETE
Area-P 1/2: 2.84 cm2
S' Lateral: 3 cm

## 2020-10-15 ENCOUNTER — Telehealth: Payer: Self-pay | Admitting: Cardiology

## 2020-10-15 NOTE — Telephone Encounter (Signed)
Pt is following up to get her Echo results from 10/13/20.Please advise pt further

## 2020-10-15 NOTE — Telephone Encounter (Signed)
Message Received: Geoffery Lyons, Thana Farr, MD  Shellia Cleverly, RN Cc: P Cv Div Ch St Triage Excellent ECHO  Normal pump function and only trivial mitral valve regurgitation.   Reassuring  Candee Furbish, MD     The patient has been notified of the result and verbalized understanding.  All questions (if any) were answered. Nuala Alpha, LPN 75/09/1636 46:65 PM

## 2020-10-18 DIAGNOSIS — M65322 Trigger finger, left index finger: Secondary | ICD-10-CM | POA: Diagnosis not present

## 2020-10-18 DIAGNOSIS — M19031 Primary osteoarthritis, right wrist: Secondary | ICD-10-CM | POA: Diagnosis not present

## 2020-11-15 ENCOUNTER — Institutional Professional Consult (permissible substitution): Payer: BC Managed Care – PPO | Admitting: Cardiology

## 2020-11-18 DIAGNOSIS — Z1231 Encounter for screening mammogram for malignant neoplasm of breast: Secondary | ICD-10-CM | POA: Diagnosis not present

## 2020-11-24 DIAGNOSIS — R922 Inconclusive mammogram: Secondary | ICD-10-CM | POA: Diagnosis not present

## 2020-11-24 DIAGNOSIS — N644 Mastodynia: Secondary | ICD-10-CM | POA: Diagnosis not present

## 2020-12-17 DIAGNOSIS — M25561 Pain in right knee: Secondary | ICD-10-CM | POA: Diagnosis not present

## 2020-12-21 DIAGNOSIS — M25562 Pain in left knee: Secondary | ICD-10-CM | POA: Diagnosis not present

## 2020-12-31 DIAGNOSIS — M25561 Pain in right knee: Secondary | ICD-10-CM | POA: Diagnosis not present

## 2021-01-14 ENCOUNTER — Institutional Professional Consult (permissible substitution): Payer: BC Managed Care – PPO | Admitting: Cardiology

## 2021-01-14 NOTE — Progress Notes (Deleted)
Electrophysiology Office Note   Date:  01/14/2021   ID:  Sarah Blackwell, DOB Jan 15, 1955, MRN [id number]  PCP:  Cari Caraway, MD  Cardiologist:  Marlou Porch Primary Electrophysiologist:  Daisie Haft Meredith Leeds, MD    Chief Complaint: AF   History of Present Illness: Sarah Blackwell is a 66 y.o. female who is being seen today for the evaluation of AF at the request of Jerline Pain, MD. Presenting today for electrophysiology evaluation.  She has a history significant for paroxysmal atrial fibrillation, hypertension, mitral valve prolapse, obstructive sleep apnea.  Today, she denies*** symptoms of palpitations, chest pain, shortness of breath, orthopnea, PND, lower extremity edema, claudication, dizziness, presyncope, syncope, bleeding, or neurologic sequela. The patient is tolerating medications without difficulties.    Past Medical History:  Diagnosis Date   Allergic rhinitis    Anxiety    Arthritis    Cancer ([place])    skin cancer   Complication of anesthesia    difficulty waking up after surgery as a child.    Fluid retention    Headache    migraines when she was younger   Hyperlipidemia    Hypertension    never has taken medications   Hypothyroidism    PCOS (polycystic ovarian syndrome)    Perimenopausal    PONV (postoperative nausea and vomiting)    Pre-diabetes    Rosacea    Wears glasses    Past Surgical History:  Procedure Laterality Date   COLONOSCOPY     FERTILITY SURGERY     MOHS SURGERY     x 2   TONSILLECTOMY     TOTAL HIP ARTHROPLASTY Right 06/30/2014   Procedure: RIGHT TOTAL HIP ARTHROPLASTY ANTERIOR APPROACH;  Surgeon: Mcarthur Rossetti, MD;  Location: [facility];  Service: Orthopedics;  Laterality: Right;   TOTAL HIP ARTHROPLASTY Left 03/16/2015   Procedure: LEFT TOTAL HIP ARTHROPLASTY ANTERIOR APPROACH;  Surgeon: Mcarthur Rossetti, MD;  Location: [facility];  Service: Orthopedics;  Laterality: Left;     Current Outpatient Medications  Medication Sig  Dispense Refill   ALPRAZolam (XANAX) 0.5 MG tablet Take 0.5 mg by mouth at bedtime as needed for sleep.  (Patient not taking: Reported on 09/29/2020)     amoxicillin (AMOXIL) 500 MG capsule Take 4 capsules by mouth as needed (Before dental work).  (Patient not taking: Reported on 09/29/2020)     Cholecalciferol (VITAMIN D3 PO) Take 5,000 Units by mouth daily. (Patient not taking: Reported on 09/29/2020)     diclofenac (VOLTAREN) 75 MG EC tablet Take 1 tablet (75 mg total) by mouth 2 (two) times daily as needed. (Patient not taking: Reported on 09/29/2020) 60 tablet 3   diltiazem (CARTIA XT) 120 MG 24 hr capsule TAKE ONE CAPSULE BY MOUTH DAILY AND AS NEEDED UP TO TWO PER DAY FOR EPISODES OF AFIB 135 capsule 3   etodolac (LODINE) 500 MG tablet Take 1 tablet (500 mg total) by mouth 2 (two) times daily between meals as needed. (Patient not taking: Reported on 09/29/2020) 60 tablet 3   finasteride (PROSCAR) 5 MG tablet Take 5 mg by mouth daily.     flecainide (TAMBOCOR) 50 MG tablet TAKE ONE TABLET BY MOUTH TWICE A DAY 180 tablet 1   fluticasone (FLONASE) 50 MCG/ACT nasal spray Place 1-2 sprays into both nostrils daily as needed for allergies. (Patient not taking: Reported on 09/29/2020)     ibuprofen (ADVIL,MOTRIN) 200 MG tablet Take 200 mg by mouth every 6 (six) hours as needed for moderate  pain. (Patient not taking: Reported on 09/29/2020)     levothyroxine (SYNTHROID, LEVOTHROID) 75 MCG tablet Take 75 mcg by mouth daily before breakfast.     liothyronine (CYTOMEL) 5 MCG tablet Take 5 mcg by mouth daily.     losartan (COZAAR) 50 MG tablet Take 50 mg by mouth daily.     Melatonin 10 MG TABS Take by mouth at bedtime.     metFORMIN (GLUCOPHAGE-XR) 500 MG 24 hr tablet Take 1,500 mg by mouth at bedtime. Pt take three tablet with largest meal once daily     nabumetone (RELAFEN) 750 MG tablet Take 1 tablet (750 mg total) by mouth 2 (two) times daily as needed. (Patient not taking: Reported on 09/29/2020) 60 tablet 6    Omega-3 Fatty Acids (OMEGA-3 FISH OIL PO) Take by mouth daily.     rosuvastatin (CRESTOR) 20 MG tablet Take 1 tablet (20 mg total) by mouth daily. 90 tablet 3   No current facility-administered medications for this visit.    Allergies:   Sulfa antibiotics   Social History:  The patient  reports that she has never smoked. She has never used smokeless tobacco. She reports current alcohol use of about 14.0 standard drinks per week. She reports that she does not use drugs.   Family History:  The patient's family history includes Alcohol abuse in her paternal grandfather; Anemia in her mother; CVA in her father; Cancer in her father, maternal grandfather, paternal grandfather, and another family member; Heart attack in her maternal grandfather; Hyperlipidemia in her father, mother, and another family member; Hypertension in her mother and another family member; Stroke in her father and another family member.    ROS:  Please see the history of present illness.   Otherwise, review of systems is positive for none.   All other systems are reviewed and negative.    PHYSICAL EXAM: VS:  There were no vitals taken for this visit. , BMI There is no height or weight on file to calculate BMI. GEN: Well nourished, well developed, in no acute distress  HEENT: normal  Neck: no JVD, carotid bruits, or masses Cardiac: ***RRR; no murmurs, rubs, or gallops,no edema  Respiratory:  clear to auscultation bilaterally, normal work of breathing GI: soft, nontender, nondistended, + BS MS: no deformity or atrophy  Skin: warm and dry Neuro:  Strength and sensation are intact Psych: euthymic mood, full affect  EKG:  EKG {ACTION; IS/IS ZOX:[id number]} ordered today. Personal review of the ekg ordered *** shows ***  Recent Labs: 07/28/2020: ALT 36    Lipid Panel     Component Value Date/Time   CHOL 158 07/28/2020 0842   TRIG 185 (H) 07/28/2020 0842   HDL 54 07/28/2020 0842   CHOLHDL 2.9 07/28/2020 0842    LDLCALC 73 07/28/2020 0842     Wt Readings from Last 3 Encounters:  09/29/20 210 lb 6.4 oz (95.4 kg)  03/26/20 216 lb (98 kg)  10/21/19 209 lb (94.8 kg)      Other studies Reviewed: Additional studies/ records that were reviewed today include: TTE 10/13/20  Review of the above records today demonstrates:   1. Left ventricular ejection fraction, by estimation, is 60 to 65%. The  left ventricle has normal function. The left ventricle has no regional  wall motion abnormalities. Left ventricular diastolic parameters were  normal.   2. Right ventricular systolic function is normal. The right ventricular  size is normal. There is normal pulmonary artery systolic pressure.   3. The  mitral valve leaflets were not seen very well. There was no  prolapse seen on this echo . Marland Kitchen The mitral valve is grossly normal. Trivial  mitral valve regurgitation.   4. The aortic valve is normal in structure. Aortic valve regurgitation is  not visualized.    ASSESSMENT AND PLAN:  1.  Paroxysmal atrial fibrillation: Currently on flecainide 50 mg twice daily, diltiazem 120 mg daily.  CHA2DS2-VASc of 1.***  Risk, benefits, and alternatives to EP study and radiofrequency ablation for afib were also discussed in detail today. These risks include but are not limited to stroke, bleeding, vascular damage, tamponade, perforation, damage to the esophagus, lungs, and other structures, pulmonary vein stenosis, worsening renal function, and death. The patient understands these risk and wishes to proceed.  We Dorotea Hand therefore proceed with catheter ablation at the next available time.  Carto, ICE, anesthesia are requested for the procedure.  Salinda Snedeker also obtain CT PV protocol prior to the procedure to exclude LAA thrombus and further evaluate atrial anatomy.   2.  Coronary artery calcifications: Calcium score of 22.  Currently on Crestor 20 mg.  Case discussed with primary cardiology  Current medicines are reviewed at length  with the patient today.   The patient {ACTIONS; HAS/DOES NOT HAVE:19233} concerns regarding her medicines.  The following changes were made today:  {NONE DEFAULTED:18576}  Labs/ tests ordered today include: *** No orders of the defined types were placed in this encounter.    Disposition:   FU with Theora Vankirk {gen number [id number]} {Days to years:10300}  Signed, Phebe Dettmer Meredith Leeds, MD  01/14/2021 8:56 AM     [facility] [address] [place] [address] [phone] (office) [phone] (fax)

## 2021-01-19 ENCOUNTER — Encounter: Payer: Self-pay | Admitting: Cardiology

## 2021-01-19 NOTE — Telephone Encounter (Signed)
Error

## 2021-03-01 ENCOUNTER — Telehealth: Payer: Self-pay | Admitting: Cardiology

## 2021-03-01 NOTE — Telephone Encounter (Signed)
Pt c/o medication issue:  1. Name of Medication: Meloxicam 15 mg tablet once a day and Monoxidil Orally for hair loss   2. How are you currently taking this medication (dosage and times per day)? Not currently taking   3. Are you having a reaction (difficulty breathing--STAT)? no  4. What is your medication issue? pt would like to know if these two medications are ok to go on... meloxicam 15mg  tablets once a day and low dose monoxidil orally for hair loss... please advise

## 2021-03-01 NOTE — Telephone Encounter (Signed)
To Dr Marlou Porch for review

## 2021-03-02 ENCOUNTER — Other Ambulatory Visit: Payer: Self-pay | Admitting: *Deleted

## 2021-03-02 MED ORDER — FLECAINIDE ACETATE 50 MG PO TABS: ORAL_TABLET | ORAL | 1 refills | Status: DC

## 2021-03-03 NOTE — Telephone Encounter (Signed)
Yes. Try to use the meloxicam sparingly since it is an NSAID - per Dr Marlou Porch  Attempted to contact pt to review information with her.  Left message on private VM of Dr Marlou Porch instructions/recommendations.  Requested she call back if any further questions or concerns.

## 2021-06-10 ENCOUNTER — Other Ambulatory Visit: Payer: Self-pay | Admitting: *Deleted

## 2021-06-10 MED ORDER — DILTIAZEM HCL ER COATED BEADS 120 MG PO CP24: ORAL_CAPSULE | ORAL | 0 refills | Status: DC

## 2021-09-19 ENCOUNTER — Other Ambulatory Visit: Payer: Self-pay

## 2021-09-19 MED ORDER — DILTIAZEM HCL ER COATED BEADS 120 MG PO CP24: ORAL_CAPSULE | ORAL | 0 refills | Status: DC

## 2021-09-19 MED ORDER — FLECAINIDE ACETATE 50 MG PO TABS: ORAL_TABLET | ORAL | 0 refills | Status: DC

## 2021-09-19 NOTE — Addendum Note (Signed)
Addended by: Carter Kitten D on: 09/19/2021 08:11 AM   Modules accepted: Orders

## 2021-09-26 ENCOUNTER — Other Ambulatory Visit: Payer: Self-pay | Admitting: *Deleted

## 2021-09-26 IMAGING — CT CT CARDIAC CORONARY ARTERY CALCIUM SCORE
3 series · 14 of 20 positions shown, 15 images · non-contrast
Comparison: None.
COMPARISON: None.

Addendum:
EXAM:
OVER-READ INTERPRETATION  CT CHEST

The following report is an over-read performed by radiologist Dr.
Him Ossa [REDACTED] on 04/20/2020. This
over-read does not include interpretation of cardiac or coronary
anatomy or pathology. The coronary calcium score interpretation by
the cardiologist is attached.
CLINICAL DATA: Cardiovascular Disease Risk stratification
Coronary Calcium Score
TECHNIQUE: A gated, non-contrast computed tomography scan of the heart was
performed using 3mm slice thickness. Axial images were analyzed on a
dedicated workstation. Calcium scoring of the coronary arteries was
performed using the Agatston method.

[Series 2: casc 3.0 bv41 2 bestdiast 66 % · axial · 0.39mm/px · z∈[-206,-140]mm · 4 of 38 slices shown, 5 images]
[im 8/38  vessel]
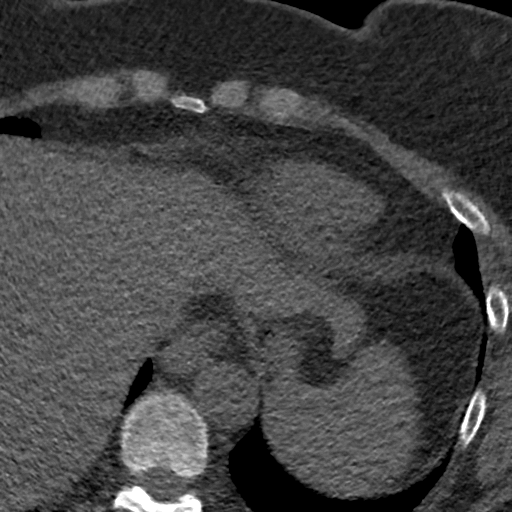
[im 8/38  lung]
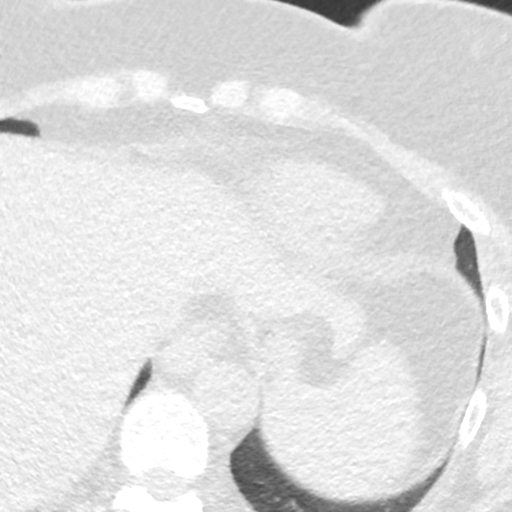
[im 15/38  vessel]
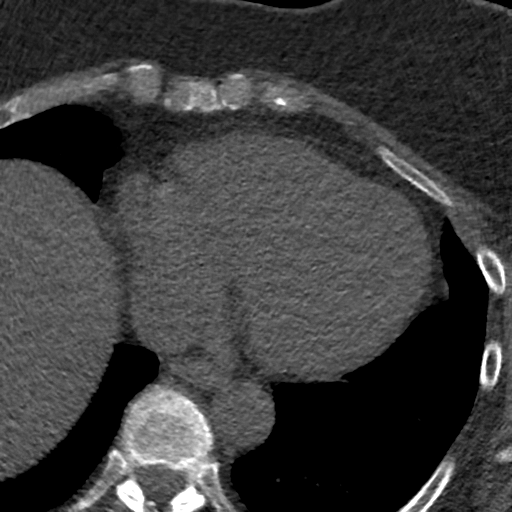
[im 23/38  vessel]
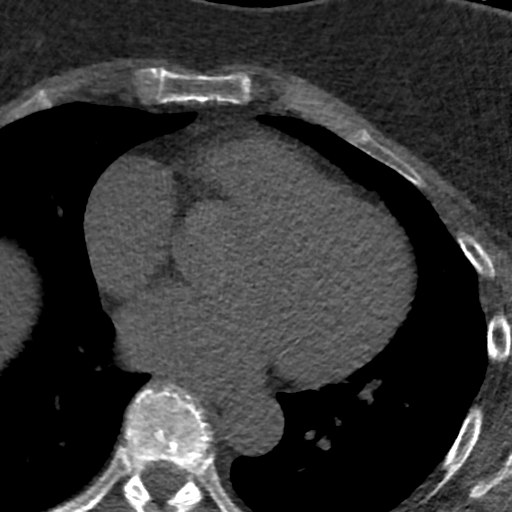
[im 30/38  vessel]
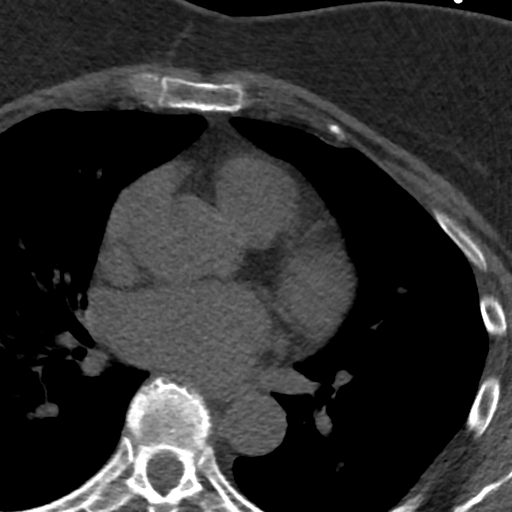

[Series 3: lung 66 % · axial · 0.66mm/px · z∈[-208,-136]mm · 5 of 38 slices shown]
[im 7/38  lung]
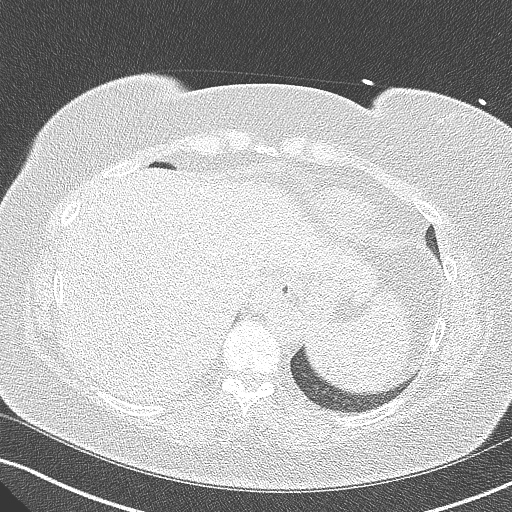
[im 13/38  lung]
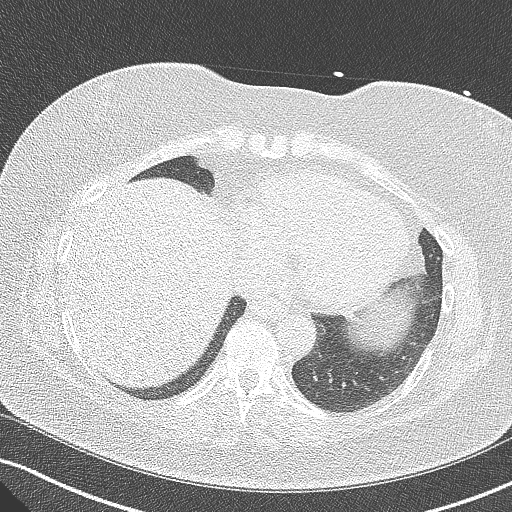
[im 19/38  lung]
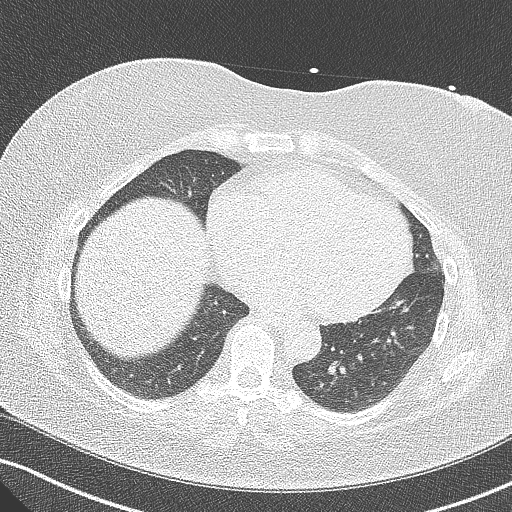
[im 25/38  lung]
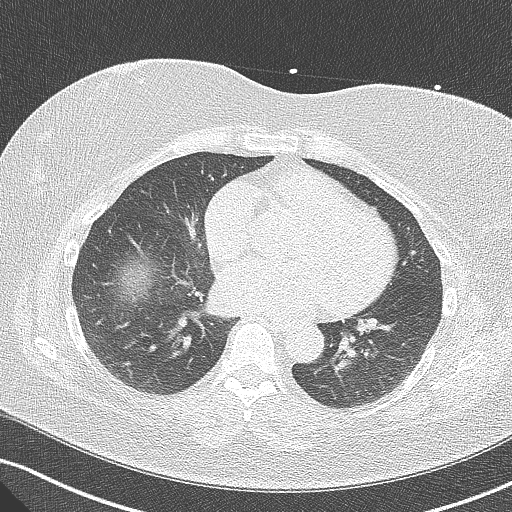
[im 31/38  lung]
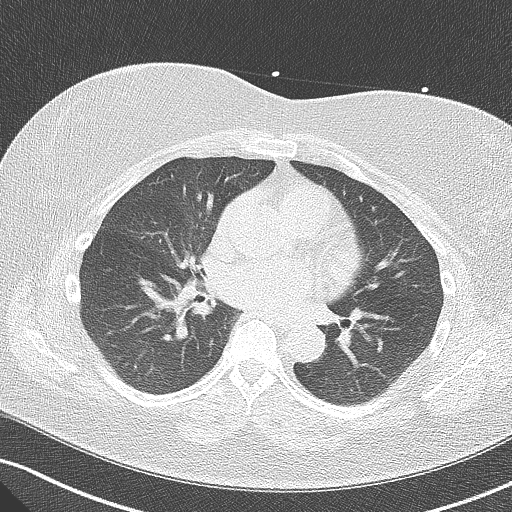

[Series 4: lung st 66 % · axial · 0.66mm/px · z∈[-208,-136]mm · 5 of 38 slices shown]
[im 7/38  lung]
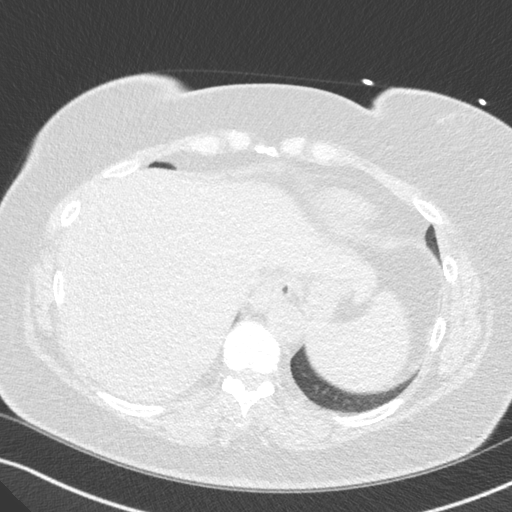
[im 13/38  lung]
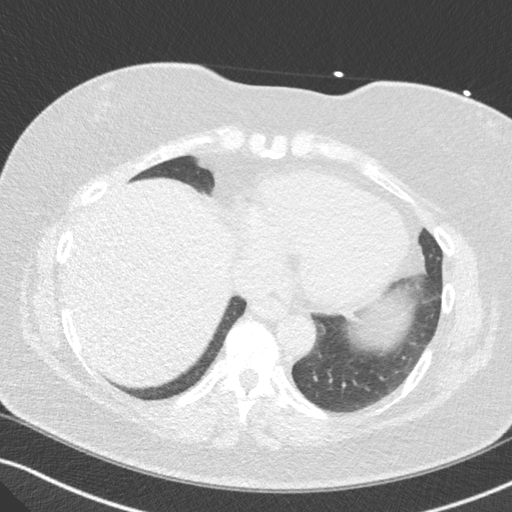
[im 19/38  lung]
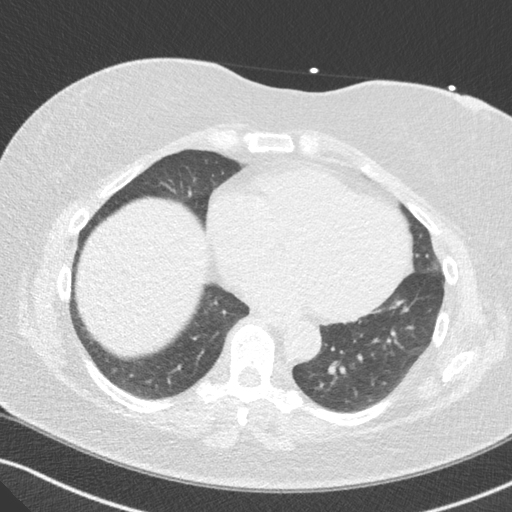
[im 25/38  lung]
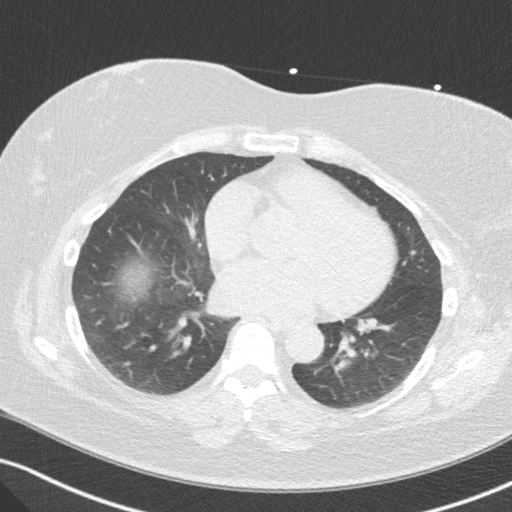
[im 31/38  lung]
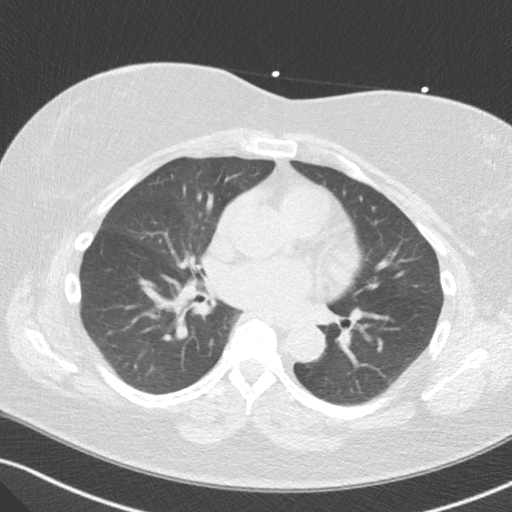

[14 of 20 positions shown; findings below may reference images not displayed]

FINDINGS: Vascular: No significant noncardiac vascular findings.

Mediastinum/Nodes: Visualized mediastinum and hilar regions
demonstrate no lymphadenopathy or masses.

Lungs/Pleura: Visualized lungs show no evidence of pulmonary edema,
consolidation, pneumothorax, nodule or pleural fluid.

Upper Abdomen: No acute abnormality.

Musculoskeletal: No chest wall mass or suspicious bone lesions
identified.
IMPRESSION: No significant incidental findings.
FINDINGS: Coronary arteries: Normal origins.

Coronary Calcium Score:

Left main: 0

Left anterior descending artery: 22

Left circumflex artery: 0

Right coronary artery: 0

Total: 22

Percentile: 58

Pericardium: Normal.

Aorta: Normal caliber of ascending aorta. No aortic atherosclerosis
noted.

Non-cardiac: See separate report from [REDACTED].
IMPRESSION: Coronary calcium score of 22. This was 58th percentile for age-,
race-, and sex-matched controls.



If CAC=0, it is reasonable to withhold statin therapy and reassess
in 5 to 10 years, as long as higher risk conditions are absent
(diabetes mellitus, family history of premature CHD in first degree
relatives (males <55 years; females <65 years), cigarette smoking,
or LDL >=190 mg/dL).

If CAC is 1 to 99, it is reasonable to initiate statin therapy for
patients >=55 years of age.

If CAC is >=100 or >=75th percentile, it is reasonable to initiate
statin therapy at any age.

Cardiology referral should be considered for patients with CAC
scores >=400 or >=75th percentile.

*9670 AHA/ACC/AACVPR/AAPA/ABC/KOIRI/KANCHI GURUNG/DEEQA RAYAAN/Fatemeh/TIGER/KNUDSEN/SURIANSYAH
Guideline on the Management of Blood Cholesterol: A Report of the
American College of Cardiology/American Heart Association Task Force
on Clinical Practice Guidelines. J Am Coll Cardiol.
3484;73(24):9687-9521.

*** End of Addendum ***
EXAM:
OVER-READ INTERPRETATION  CT CHEST

The following report is an over-read performed by radiologist Dr.
Him Ossa [REDACTED] on 04/20/2020. This
over-read does not include interpretation of cardiac or coronary
anatomy or pathology. The coronary calcium score interpretation by
the cardiologist is attached.
FINDINGS: Vascular: No significant noncardiac vascular findings.

Mediastinum/Nodes: Visualized mediastinum and hilar regions
demonstrate no lymphadenopathy or masses.

Lungs/Pleura: Visualized lungs show no evidence of pulmonary edema,
consolidation, pneumothorax, nodule or pleural fluid.

Upper Abdomen: No acute abnormality.

Musculoskeletal: No chest wall mass or suspicious bone lesions
identified.
IMPRESSION: No significant incidental findings.

## 2021-10-18 ENCOUNTER — Telehealth: Payer: Self-pay | Admitting: Physician Assistant

## 2021-10-18 NOTE — Telephone Encounter (Signed)
Pt husband called stating she has the flu and is back in Afib. HR 130-150s. Sounds like she may be vomiting and was not keeping medications down. I advised checking BP and taking cartia and flecainide this morning. If HR still high and SBP above 110 after 2 hrs, can then take 30 mg diltiazem. We discussed ER precautions. She is short of breath, but unclear if this is due to flu or possibly early CHF. I states they could try to call PCP for further management of her flu. Otherwise, can come to [facility] for further management of RVR. He expressed understanding of the plan.   They saw [facility] for pending ablation. I advised they need to keep track of when she missed her eliquis if she has a pending ablation.

## 2021-10-24 ENCOUNTER — Other Ambulatory Visit: Payer: Self-pay

## 2021-10-24 MED ORDER — FLECAINIDE ACETATE 50 MG PO TABS
ORAL_TABLET | ORAL | 0 refills | Status: DC
Start: 2021-10-24 — End: 2022-06-29

## 2021-10-28 ENCOUNTER — Telehealth: Payer: Self-pay | Admitting: Cardiology

## 2021-10-28 NOTE — Telephone Encounter (Signed)
Patient c/o Palpitations:  High priority if patient c/o lightheadedness, shortness of breath, or chest pain  How long have you had palpitations/irregular HR/ Afib? Are you having the symptoms now?  Tachycardia/afib for the past week Device registers 118 HR and tachycardia   Are you currently experiencing lightheadedness, SOB or CP?  Not currently, but patient has been SOB   Do you have a history of afib (atrial fibrillation) or irregular heart rhythm? Yes  Have you checked your BP or HR? (document readings if available):  BP has been a little elevated but no readings available HR IS 118  Are you experiencing any other symptoms?  SOB and tiredness recently

## 2021-10-28 NOTE — Telephone Encounter (Signed)
Spoke with pt who reports she has been sick with flu like s/s for 3 weeks.  Saw PCP who started her on z-pack, prednisone and something for cough.  After taking these medications she developed tachycardia/At Fib per her [employer].  She continue provider on call who advised her to d/x the prednisone which she did.  Tachycardia/A fib continues.  She reports she has been staying well hydrated and taking medications as listed.  She has been taking Diltiazem 120 mg once daily and no prn doses.  Advised OK to take prn dose with HR over 100 bpm.  She will continue to monitor HR and BP.  Of note -  pt has seen Dr Omelia Blackwater at [facility] and was started on Eliquis and scheduled for ablation in November.  Pt has been scheduled to be seen here next week as she is overdue to see Dr Marlou Porch.  She is aware to contact provider on call over the weekend if needed.  Aware this information will be sent to Dr Marlou Porch for his knowledge and any further orders/recommendations.

## 2021-11-01 NOTE — Telephone Encounter (Signed)
Pt aware to contact Dr Omelia Blackwater at [facility].  She has been trying to reach his office and will continue to do so.

## 2021-11-03 ENCOUNTER — Ambulatory Visit: Payer: BC Managed Care – PPO | Admitting: Cardiology

## 2022-02-01 DIAGNOSIS — E039 Hypothyroidism, unspecified: Secondary | ICD-10-CM | POA: Diagnosis not present

## 2022-02-01 DIAGNOSIS — E559 Vitamin D deficiency, unspecified: Secondary | ICD-10-CM | POA: Diagnosis not present

## 2022-02-01 DIAGNOSIS — R7303 Prediabetes: Secondary | ICD-10-CM | POA: Diagnosis not present

## 2022-02-01 DIAGNOSIS — E782 Mixed hyperlipidemia: Secondary | ICD-10-CM | POA: Diagnosis not present

## 2022-02-08 DIAGNOSIS — R7303 Prediabetes: Secondary | ICD-10-CM | POA: Diagnosis not present

## 2022-02-08 DIAGNOSIS — Z124 Encounter for screening for malignant neoplasm of cervix: Secondary | ICD-10-CM | POA: Diagnosis not present

## 2022-02-08 DIAGNOSIS — Z0000 Encounter for general adult medical examination without abnormal findings: Secondary | ICD-10-CM | POA: Diagnosis not present

## 2022-02-08 DIAGNOSIS — I10 Essential (primary) hypertension: Secondary | ICD-10-CM | POA: Diagnosis not present

## 2022-02-08 DIAGNOSIS — E782 Mixed hyperlipidemia: Secondary | ICD-10-CM | POA: Diagnosis not present

## 2022-02-08 DIAGNOSIS — I480 Paroxysmal atrial fibrillation: Secondary | ICD-10-CM | POA: Diagnosis not present

## 2022-02-19 ENCOUNTER — Other Ambulatory Visit: Payer: Self-pay | Admitting: Cardiology

## 2022-02-20 ENCOUNTER — Other Ambulatory Visit: Payer: Self-pay | Admitting: *Deleted

## 2022-02-23 DIAGNOSIS — Z7901 Long term (current) use of anticoagulants: Secondary | ICD-10-CM | POA: Diagnosis not present

## 2022-02-23 DIAGNOSIS — Z5181 Encounter for therapeutic drug level monitoring: Secondary | ICD-10-CM | POA: Diagnosis not present

## 2022-02-23 DIAGNOSIS — I480 Paroxysmal atrial fibrillation: Secondary | ICD-10-CM | POA: Diagnosis not present

## 2022-02-23 DIAGNOSIS — Z79899 Other long term (current) drug therapy: Secondary | ICD-10-CM | POA: Diagnosis not present

## 2022-02-23 DIAGNOSIS — Z8679 Personal history of other diseases of the circulatory system: Secondary | ICD-10-CM | POA: Diagnosis not present

## 2022-02-23 DIAGNOSIS — Z9889 Other specified postprocedural states: Secondary | ICD-10-CM | POA: Diagnosis not present

## 2022-04-26 DIAGNOSIS — I4891 Unspecified atrial fibrillation: Secondary | ICD-10-CM | POA: Diagnosis not present

## 2022-04-26 DIAGNOSIS — Z9889 Other specified postprocedural states: Secondary | ICD-10-CM | POA: Diagnosis not present

## 2022-04-26 DIAGNOSIS — Z6832 Body mass index (BMI) 32.0-32.9, adult: Secondary | ICD-10-CM | POA: Diagnosis not present

## 2022-04-26 DIAGNOSIS — I10 Essential (primary) hypertension: Secondary | ICD-10-CM | POA: Diagnosis not present

## 2022-04-26 DIAGNOSIS — Z5181 Encounter for therapeutic drug level monitoring: Secondary | ICD-10-CM | POA: Diagnosis not present

## 2022-04-26 DIAGNOSIS — E6609 Other obesity due to excess calories: Secondary | ICD-10-CM | POA: Diagnosis not present

## 2022-04-26 DIAGNOSIS — I480 Paroxysmal atrial fibrillation: Secondary | ICD-10-CM | POA: Diagnosis not present

## 2022-04-26 DIAGNOSIS — G4733 Obstructive sleep apnea (adult) (pediatric): Secondary | ICD-10-CM | POA: Diagnosis not present

## 2022-04-26 DIAGNOSIS — Z79899 Other long term (current) drug therapy: Secondary | ICD-10-CM | POA: Diagnosis not present

## 2022-05-03 DIAGNOSIS — E119 Type 2 diabetes mellitus without complications: Secondary | ICD-10-CM | POA: Diagnosis not present

## 2022-05-03 DIAGNOSIS — H524 Presbyopia: Secondary | ICD-10-CM | POA: Diagnosis not present

## 2022-05-03 DIAGNOSIS — H5203 Hypermetropia, bilateral: Secondary | ICD-10-CM | POA: Diagnosis not present

## 2022-05-03 DIAGNOSIS — H02403 Unspecified ptosis of bilateral eyelids: Secondary | ICD-10-CM | POA: Diagnosis not present

## 2022-05-29 DIAGNOSIS — I480 Paroxysmal atrial fibrillation: Secondary | ICD-10-CM | POA: Diagnosis not present

## 2022-06-07 DIAGNOSIS — J209 Acute bronchitis, unspecified: Secondary | ICD-10-CM | POA: Diagnosis not present

## 2022-06-07 DIAGNOSIS — Z6832 Body mass index (BMI) 32.0-32.9, adult: Secondary | ICD-10-CM | POA: Diagnosis not present

## 2022-06-15 DIAGNOSIS — R5383 Other fatigue: Secondary | ICD-10-CM | POA: Diagnosis not present

## 2022-06-21 DIAGNOSIS — I2510 Atherosclerotic heart disease of native coronary artery without angina pectoris: Secondary | ICD-10-CM | POA: Diagnosis not present

## 2022-06-21 DIAGNOSIS — Z1331 Encounter for screening for depression: Secondary | ICD-10-CM | POA: Diagnosis not present

## 2022-06-21 DIAGNOSIS — E782 Mixed hyperlipidemia: Secondary | ICD-10-CM | POA: Diagnosis not present

## 2022-06-21 DIAGNOSIS — E282 Polycystic ovarian syndrome: Secondary | ICD-10-CM | POA: Diagnosis not present

## 2022-06-21 DIAGNOSIS — E559 Vitamin D deficiency, unspecified: Secondary | ICD-10-CM | POA: Diagnosis not present

## 2022-06-21 DIAGNOSIS — R7303 Prediabetes: Secondary | ICD-10-CM | POA: Diagnosis not present

## 2022-06-21 DIAGNOSIS — E669 Obesity, unspecified: Secondary | ICD-10-CM | POA: Diagnosis not present

## 2022-06-21 DIAGNOSIS — Z6832 Body mass index (BMI) 32.0-32.9, adult: Secondary | ICD-10-CM | POA: Diagnosis not present

## 2022-06-21 DIAGNOSIS — R5382 Chronic fatigue, unspecified: Secondary | ICD-10-CM | POA: Diagnosis not present

## 2022-06-21 DIAGNOSIS — G4730 Sleep apnea, unspecified: Secondary | ICD-10-CM | POA: Diagnosis not present

## 2022-06-22 NOTE — Progress Notes (Signed)
Cardiology Office Note:    Date:  06/29/2022   ID:  Sarah Blackwell, DOB [birth year], MRN [id number]  PCP:  Gweneth Dimitri, MD   [facility] Providers Cardiologist:  Donato Schultz, MD     Referring MD: Gweneth Dimitri, MD   Chief Complaint: fatigue  History of Present Illness:    Sarah Blackwell is a very pleasant 67 y.o. female with a hx of atrial fibrillation on flecainide, a fib ablation 12/15/21, nonobstructive CAD on CT, OSA on CPAP, HLD, HTN, obesity, mild MVP, and family history of CAD (father died of heart attack age 12).   Referred to cardiology and seen by Dr. Anne Fu 12/18/2017 for management of mitral valve prolapse.  She reported an episode of atrial fibrillation on 09/26/2016 at dinner with friends. Had some wine. Symptoms lasted for several hours and she spontaneously converted in the emergency department after receiving Eliquis and diltiazem. Thinks she may have had 7 episodes prior to that that were short-lived.  She reported generalized fatigue and was seen in A-fib clinic.  Was encouraged to decrease alcohol intake to perhaps 2 glasses of wine per week and to stop drinking coffee. Was started on flecainide by Dr. Anne Fu.  Echo at that time revealed mild anterior leaflet prolapse, trivial regurgitation.  Diagnosed with mild to moderate OSA in 2020 with AHI of 14.6/h with no significant desaturations.  She was started on auto CPAP. She did not tolerate face mask due to claustrophobia.   Last seen in our clinic on 09/29/20 by Dr. Anne Fu. She was referred to EP for consideration of a fib ablation.   EP has been followed by [facility]. She underwent a fib ablation 12/15/21 at [facility] with no evidence of atrial myopathy during the procedure. Was released on dofetilide and Eliquis. She was prescribed a 14-day monitor at her last office visit on 04/26/2022 for possible return of a fib.   Today, she is here for evaluation of fatigue. Has not felt well since last Fall late Sept/early  Oct. Had some respiratory symptoms, including cough at that time, not certain if she had COVID infection at that time but had it prior to becoming ill. Wonders if she has long COVID. Also underwent DCCV in November and a fib ablation in December. Monitor was ordered by EP at [facility] for fatigue, wondering if she has return of a fib. She just mailed it back yesterday. Has not felt like she was in a fib. Feels occasional chest discomfort, no sustained discomfort, "odd pains." Walks for exercise 3-4 times per week in addition to pilates 1-2 x per week and treads water in a pool. Has noted more shortness of breath with exercise recently. Feels more "wiped out" after exercising which is unusual for her. Recent symptoms include cold like symptoms including productive cough that got initially got better after a 7-day course of doxycycline.  A few days later she felt return of symptoms and has not been placed on 10 day course of doxy which she is still taking. Cough from last fall did improve, but returned recently. No edema, orthopnea, PND, presyncope, or syncope.   Past Medical History:  Diagnosis Date   Allergic rhinitis    Anxiety    Arthritis    Cancer (HCC)    skin cancer   Complication of anesthesia    difficulty waking up after surgery as a child.    Fluid retention    Headache    migraines when she was younger   Hyperlipidemia  Hypertension    never has taken medications   Hypothyroidism    PCOS (polycystic ovarian syndrome)    Perimenopausal    PONV (postoperative nausea and vomiting)    Pre-diabetes    Rosacea    Wears glasses     Past Surgical History:  Procedure Laterality Date   COLONOSCOPY     FERTILITY SURGERY     MOHS SURGERY     x 2   TONSILLECTOMY     TOTAL HIP ARTHROPLASTY Right 06/30/2014   Procedure: RIGHT TOTAL HIP ARTHROPLASTY ANTERIOR APPROACH;  Surgeon: Kathryne Hitch, MD;  Location: [facility] OR;  Service: Orthopedics;  Laterality: Right;   TOTAL HIP ARTHROPLASTY  Left 03/16/2015   Procedure: LEFT TOTAL HIP ARTHROPLASTY ANTERIOR APPROACH;  Surgeon: Kathryne Hitch, MD;  Location: [facility] OR;  Service: Orthopedics;  Laterality: Left;    Current Medications: Current Meds  Medication Sig   ALPRAZolam (XANAX) 0.5 MG tablet Take 0.5 mg by mouth at bedtime as needed for sleep.   amoxicillin (AMOXIL) 500 MG capsule Take 4 capsules by mouth as needed (Before dental work).   apixaban (ELIQUIS) 5 MG TABS tablet Take 5 mg by mouth 2 (two) times daily.   ARNUITY ELLIPTA 100 MCG/ACT AEPB    Cholecalciferol (VITAMIN D3 PO) Take 5,000 Units by mouth daily.   diltiazem (CARDIZEM CD) 120 MG 24 hr capsule TAKE 1 CAPSULE BY MOUTH EVERY DAY AND AS NEEDED UP TO 2 CAPSULES EVERY DAY FOR EPISODES OF AFIB   diltiazem (CARDIZEM) 30 MG tablet Take 30 mg by mouth.   dofetilide (TIKOSYN) 250 MCG capsule Take 250 mcg by mouth 2 (two) times daily.   doxycycline (VIBRA-TABS) 100 MG tablet Take 100 mg by mouth 2 (two) times daily.   fluticasone (FLONASE) 50 MCG/ACT nasal spray Place 1-2 sprays into both nostrils daily as needed for allergies.   Ginger, Zingiber officinalis, (GINGER ROOT) 550 MG CAPS Take by mouth.   levothyroxine (SYNTHROID, LEVOTHROID) 75 MCG tablet Take 75 mcg by mouth daily before breakfast.   liothyronine (CYTOMEL) 5 MCG tablet Take 5 mcg by mouth daily.   losartan (COZAAR) 50 MG tablet Take 50 mg by mouth daily.   magnesium oxide (MAG-OX) 400 MG tablet Take by mouth.   Melatonin 10 MG TABS Take by mouth at bedtime.   metFORMIN (GLUCOPHAGE-XR) 500 MG 24 hr tablet Take 1,500 mg by mouth at bedtime. Pt take three tablet with largest meal once daily   Multiple Vitamin (MULTI-VITAMIN) tablet Take 1 tablet by mouth daily.   rosuvastatin (CRESTOR) 20 MG tablet Take 1 tablet (20 mg total) by mouth daily.   Turmeric 500 MG TABS Take by mouth.     Allergies:   Sulfa antibiotics   Social History   Socioeconomic History   Marital status: Married    Spouse name:  Not on file   Number of children: Not on file   Years of education: Not on file   Highest education level: Not on file  Occupational History   Not on file  Tobacco Use   Smoking status: Never   Smokeless tobacco: Never  Vaping Use   Vaping Use: Never used  Substance and Sexual Activity   Alcohol use: Yes    Alcohol/week: 14.0 standard drinks of alcohol    Types: 14 Glasses of wine per week    Comment: 1-2 glasses of wine daily   Drug use: No   Sexual activity: Not on file  Other Topics Concern  Not on file  Social History Narrative   Not on file   Social Determinants of Health   Financial Resource Strain: Not on file  Food Insecurity: Not on file  Transportation Needs: Not on file  Physical Activity: Not on file  Stress: Not on file  Social Connections: Not on file     Family History: The patient's family history includes Alcohol abuse in her paternal grandfather; Anemia in her mother; CVA in her father; Cancer in her father, maternal grandfather, paternal grandfather, and another family member; Heart attack in her maternal grandfather; Hyperlipidemia in her father, mother, and another family member; Hypertension in her mother and another family member; Stroke in her father and another family member.  ROS:   Please see the history of present illness.    + fatigue + productive cough All other systems reviewed and are negative.  Labs/Other Studies Reviewed:    The following studies were reviewed today:  Echo 10/13/20 1. Left ventricular ejection fraction, by estimation, is 60 to 65%. The  left ventricle has normal function. The left ventricle has no regional  wall motion abnormalities. Left ventricular diastolic parameters were  normal.   2. Right ventricular systolic function is normal. The right ventricular  size is normal. There is normal pulmonary artery systolic pressure.   3. The mitral valve leaflets were not seen very well. There was no  prolapse seen on  this echo . Marland Kitchen The mitral valve is grossly normal. Trivial  mitral valve regurgitation.   4. The aortic valve is normal in structure. Aortic valve regurgitation is  not visualized.   CT Cardiac Score 04/21/20 Coronary arteries: Normal origins.   Coronary Calcium Score:   Left main: 0   Left anterior descending artery: 22   Left circumflex artery: 0   Right coronary artery: 0   Total: 22   Percentile: 58   Pericardium: Normal.   Aorta: Normal caliber of ascending aorta. No aortic atherosclerosis noted.   Non-cardiac: See separate report from [facility].   IMPRESSION: Coronary calcium score of 22. This was 58th percentile for age-, race-, and sex-matched controls.   ETT 12/27/17 Blood pressure demonstrated a hypertensive response to exercise. There was no ST segment deviation noted during stress.   1. Hypertensive BP response.  2. Normal exercise tolerance.  3. No evidence for ischemia by ST segment analysis.      Recent Labs: No results found for requested labs within last 365 days.  Recent Lipid Panel    Component Value Date/Time   CHOL 158 07/28/2020 0842   TRIG 185 (H) 07/28/2020 0842   HDL 54 07/28/2020 0842   CHOLHDL 2.9 07/28/2020 0842   LDLCALC 73 07/28/2020 0842     Risk Assessment/Calculations:    CHA2DS2-VASc Score = 3   This indicates a 3.2% annual risk of stroke. The patient's score is based upon: CHF History: 0 HTN History: 1 Diabetes History: 0 Stroke History: 0 Vascular Disease History: 0 Age Score: 1 Gender Score: 1          Physical Exam:    VS:  BP 116/70   Pulse 66   Ht 5\' 7"  (1.702 m)   Wt 208 lb 6.4 oz (94.5 kg)   SpO2 93%   BMI 32.64 kg/m     Wt Readings from Last 3 Encounters:  06/29/22 208 lb 6.4 oz (94.5 kg)  09/29/20 210 lb 6.4 oz (95.4 kg)  03/26/20 216 lb (98 kg)     GEN:  Obese, well developed in no acute distress HEENT: Normal NECK: No JVD; No carotid bruits CARDIAC: RRR, no murmurs,  rubs, gallops RESPIRATORY:  Clear to auscultation without rales, wheezing or rhonchi  ABDOMEN: Soft, non-tender, non-distended MUSCULOSKELETAL:  No edema; No deformity. 2+ pedal pulses, equal bilaterally SKIN: Warm and dry NEUROLOGIC:  Alert and oriented x 3 PSYCHIATRIC:  Normal affect   EKG:   EKG Interpretation  Date/Time:  Thursday June 29 2022 08:43:36 EDT Ventricular Rate:  67 PR Interval:  164 QRS Duration: 78 QT Interval:  426 QTC Calculation: 450 R Axis:   -7 Text Interpretation: Normal sinus rhythm Normal ECG When compared with ECG of 29-Sep-2016 08:48, QT has lengthened stable in comparison to EKG 04/26/22 Confirmed by Eligha Bridegroom [phone]) on 06/29/2022 10:48:48 AM         Diagnoses:    1. Fatigue, unspecified type   2. PAF (paroxysmal atrial fibrillation) (HCC)   3. Coronary artery calcification   4. Hyperlipidemia LDL goal <70   5. Mitral valve disorder   6. Coronary artery disease involving native coronary artery of native heart without angina pectoris   7. High risk medication use    Assessment and Plan:     Fatigue: Fatigue for > 6 months also associated with respiratory symptoms. Reports respiratory symptoms initially present when symptoms of fatigue started in Oct 2023, improved somewhat then returned but no improvement in fatigue. No lab abnormality to explain fatigue. Slight decrease in exercise frequency for the past several months due to symptoms and a fib ablation as well as Tikosyn loading. We discussed potentially ordering further ischemia evaluation, however with only mild CAC in LAD in 2022, I do not feel strongly that this is a symptom of angina. Encouraged continued management of respiratory symptoms and follow-up with PCP. Will see her back in a few months for reevaluation. Advised her to notify us if symptoms worsen.   Mitral valve disorder: Mitral leaflets not seen well with no prolapse and trivial MR on echo 10/2020. On Cardiac MR 12/2021  normal mitral valve leaflets with trivial regurgitation and no stenosis. Normal LVEF 59%. I do not appreciate a murmur on exam. We will continue to monitor clinically for now.   PAF: S/p a fib ablation. Cardiac monitor ordered by EP cardiologist at [facility], returned yesterday.  Feeling fatigued but no chest discomfort that is typical for her with a fib. QTc is stable on EKG today. On Tikosyn for AAD, no acute concerns. On Eliquis for stroke prevention for CHA2DS2-VASc score of 3. No bleeding concerns. Management per [facility].   CAD without angina/Elevated coronary artery calcium score: Coronary artery calcium score 22 with calcium in LAD (58th percentile). Lengthy discussion about these results and fatigue as form of angina. Reassuring results from cardiac MR from 12/2021. No acute ST abnormalities. She has increased shortness of breath and fatigue but no chest pain. No dyspnea. Continues to exercise without significant symptoms. Lengthy discussion about further ischemia evaluation. Has a pretty significant cough but no wheezing, rales, or rhonchi. I advised that she continue management of respiratory symptoms and return in 2-3 months for further discussion of symptoms. Continue to gradually increase exercise. Encouraged 150 minutes moderate intensity exercise each week and mostly plant based diet. Notify us if symptoms worsen.   Hyperlipidemia LDL goal < 70: LDL 58 on 02/01/22. Discussion about importance of LDL goal < 70. Continue rosuvastatin.     Disposition: 2-3 months with me  Medication Adjustments/Labs and Tests Ordered: Current  medicines are reviewed at length with the patient today.  Concerns regarding medicines are outlined above.  Orders Placed This Encounter  Procedures   EKG 12-Lead   No orders of the defined types were placed in this encounter.   Patient Instructions  Medication Instructions:   Your physician recommends that you continue on your current medications as directed. Please  refer to the Current Medication list given to you today.   *If you need a refill on your cardiac medications before your next appointment, please call your pharmacy*   Lab Work:  None ordered.  If you have labs (blood work) drawn today and your tests are completely normal, you will receive your results only by: MyChart Message (if you have MyChart) OR A paper copy in the mail If you have any lab test that is abnormal or we need to change your treatment, we will call you to review the results.   Testing/Procedures:  None ordered.   Follow-Up: At [facility], you and your health needs are our priority.  As part of our continuing mission to provide you with exceptional heart care, we have created designated Provider Care Teams.  These Care Teams include your primary Cardiologist (physician) and Advanced Practice Providers (APPs -  Physician Assistants and Nurse Practitioners) who all work together to provide you with the care you need, when you need it.  We recommend signing up for the patient portal called "MyChart".  Sign up information is provided on this After Visit Summary.  MyChart is used to connect with patients for Virtual Visits (Telemedicine).  Patients are able to view lab/test results, encounter notes, upcoming appointments, etc.  Non-urgent messages can be sent to your provider as well.   To learn more about what you can do with MyChart, go to [email or web address].    Your next appointment:   3 month(s)  Provider:   Eligha Bridegroom, NP           Signed, Levi Aland, NP  06/29/2022 12:41 PM    [facility]

## 2022-06-24 DIAGNOSIS — R053 Chronic cough: Secondary | ICD-10-CM | POA: Diagnosis not present

## 2022-06-29 ENCOUNTER — Ambulatory Visit: Payer: Medicare HMO | Attending: Nurse Practitioner | Admitting: Nurse Practitioner

## 2022-06-29 ENCOUNTER — Encounter: Payer: Self-pay | Admitting: Nurse Practitioner

## 2022-06-29 VITALS — BP 116/70 | HR 66 | Ht 67.0 in | Wt 208.4 lb

## 2022-06-29 DIAGNOSIS — E785 Hyperlipidemia, unspecified: Secondary | ICD-10-CM

## 2022-06-29 DIAGNOSIS — R5383 Other fatigue: Secondary | ICD-10-CM | POA: Diagnosis not present

## 2022-06-29 DIAGNOSIS — I059 Rheumatic mitral valve disease, unspecified: Secondary | ICD-10-CM

## 2022-06-29 DIAGNOSIS — I2584 Coronary atherosclerosis due to calcified coronary lesion: Secondary | ICD-10-CM | POA: Diagnosis not present

## 2022-06-29 DIAGNOSIS — I480 Paroxysmal atrial fibrillation: Secondary | ICD-10-CM

## 2022-06-29 DIAGNOSIS — Z79899 Other long term (current) drug therapy: Secondary | ICD-10-CM | POA: Diagnosis not present

## 2022-06-29 DIAGNOSIS — I2510 Atherosclerotic heart disease of native coronary artery without angina pectoris: Secondary | ICD-10-CM

## 2022-06-29 NOTE — Patient Instructions (Signed)
Medication Instructions:   Your physician recommends that you continue on your current medications as directed. Please refer to the Current Medication list given to you today.   *If you need a refill on your cardiac medications before your next appointment, please call your pharmacy*   Lab Work:  None ordered.  If you have labs (blood work) drawn today and your tests are completely normal, you will receive your results only by: MyChart Message (if you have MyChart) OR A paper copy in the mail If you have any lab test that is abnormal or we need to change your treatment, we will call you to review the results.   Testing/Procedures:  None ordered.   Follow-Up: At [facility], you and your health needs are our priority.  As part of our continuing mission to provide you with exceptional heart care, we have created designated Provider Care Teams.  These Care Teams include your primary Cardiologist (physician) and Advanced Practice Providers (APPs -  Physician Assistants and Nurse Practitioners) who all work together to provide you with the care you need, when you need it.  We recommend signing up for the patient portal called "MyChart".  Sign up information is provided on this After Visit Summary.  MyChart is used to connect with patients for Virtual Visits (Telemedicine).  Patients are able to view lab/test results, encounter notes, upcoming appointments, etc.  Non-urgent messages can be sent to your provider as well.   To learn more about what you can do with MyChart, go to [email or web address].    Your next appointment:   3 month(s)  Provider:   Michelle Swinyer, NP         

## 2022-07-03 DIAGNOSIS — I480 Paroxysmal atrial fibrillation: Secondary | ICD-10-CM | POA: Diagnosis not present

## 2022-07-03 DIAGNOSIS — Z8679 Personal history of other diseases of the circulatory system: Secondary | ICD-10-CM | POA: Diagnosis not present

## 2022-07-03 DIAGNOSIS — Z9889 Other specified postprocedural states: Secondary | ICD-10-CM | POA: Diagnosis not present

## 2022-07-05 DIAGNOSIS — E282 Polycystic ovarian syndrome: Secondary | ICD-10-CM | POA: Diagnosis not present

## 2022-07-05 DIAGNOSIS — E782 Mixed hyperlipidemia: Secondary | ICD-10-CM | POA: Diagnosis not present

## 2022-07-05 DIAGNOSIS — I2510 Atherosclerotic heart disease of native coronary artery without angina pectoris: Secondary | ICD-10-CM | POA: Diagnosis not present

## 2022-07-05 DIAGNOSIS — R7303 Prediabetes: Secondary | ICD-10-CM | POA: Diagnosis not present

## 2022-07-05 DIAGNOSIS — E669 Obesity, unspecified: Secondary | ICD-10-CM | POA: Diagnosis not present

## 2022-07-05 DIAGNOSIS — R5382 Chronic fatigue, unspecified: Secondary | ICD-10-CM | POA: Diagnosis not present

## 2022-07-05 DIAGNOSIS — E559 Vitamin D deficiency, unspecified: Secondary | ICD-10-CM | POA: Diagnosis not present

## 2022-07-05 DIAGNOSIS — Z6832 Body mass index (BMI) 32.0-32.9, adult: Secondary | ICD-10-CM | POA: Diagnosis not present

## 2022-07-19 DIAGNOSIS — Z6832 Body mass index (BMI) 32.0-32.9, adult: Secondary | ICD-10-CM | POA: Diagnosis not present

## 2022-07-19 DIAGNOSIS — I2510 Atherosclerotic heart disease of native coronary artery without angina pectoris: Secondary | ICD-10-CM | POA: Diagnosis not present

## 2022-07-19 DIAGNOSIS — E669 Obesity, unspecified: Secondary | ICD-10-CM | POA: Diagnosis not present

## 2022-07-19 DIAGNOSIS — R7303 Prediabetes: Secondary | ICD-10-CM | POA: Diagnosis not present

## 2022-07-19 DIAGNOSIS — E782 Mixed hyperlipidemia: Secondary | ICD-10-CM | POA: Diagnosis not present

## 2022-07-19 DIAGNOSIS — R5382 Chronic fatigue, unspecified: Secondary | ICD-10-CM | POA: Diagnosis not present

## 2022-07-19 DIAGNOSIS — E559 Vitamin D deficiency, unspecified: Secondary | ICD-10-CM | POA: Diagnosis not present

## 2022-07-19 DIAGNOSIS — E282 Polycystic ovarian syndrome: Secondary | ICD-10-CM | POA: Diagnosis not present

## 2022-08-02 ENCOUNTER — Other Ambulatory Visit (HOSPITAL_COMMUNITY): Payer: Self-pay

## 2022-08-02 DIAGNOSIS — E669 Obesity, unspecified: Secondary | ICD-10-CM | POA: Diagnosis not present

## 2022-08-02 DIAGNOSIS — E282 Polycystic ovarian syndrome: Secondary | ICD-10-CM | POA: Diagnosis not present

## 2022-08-02 DIAGNOSIS — E559 Vitamin D deficiency, unspecified: Secondary | ICD-10-CM | POA: Diagnosis not present

## 2022-08-02 DIAGNOSIS — R5382 Chronic fatigue, unspecified: Secondary | ICD-10-CM | POA: Diagnosis not present

## 2022-08-02 DIAGNOSIS — I2510 Atherosclerotic heart disease of native coronary artery without angina pectoris: Secondary | ICD-10-CM | POA: Diagnosis not present

## 2022-08-02 DIAGNOSIS — R7303 Prediabetes: Secondary | ICD-10-CM | POA: Diagnosis not present

## 2022-08-02 DIAGNOSIS — E782 Mixed hyperlipidemia: Secondary | ICD-10-CM | POA: Diagnosis not present

## 2022-08-02 DIAGNOSIS — Z6832 Body mass index (BMI) 32.0-32.9, adult: Secondary | ICD-10-CM | POA: Diagnosis not present

## 2022-08-02 MED ORDER — WEGOVY 0.5 MG/0.5ML ~~LOC~~ SOAJ
0.5000 mg | SUBCUTANEOUS | 1 refills | Status: DC
  Filled 2022-08-02: qty 2, 28d supply, fill #0

## 2022-08-03 ENCOUNTER — Other Ambulatory Visit (HOSPITAL_COMMUNITY): Payer: Self-pay

## 2022-08-10 DIAGNOSIS — J4520 Mild intermittent asthma, uncomplicated: Secondary | ICD-10-CM | POA: Diagnosis not present

## 2022-08-10 DIAGNOSIS — D6869 Other thrombophilia: Secondary | ICD-10-CM | POA: Diagnosis not present

## 2022-08-10 DIAGNOSIS — E669 Obesity, unspecified: Secondary | ICD-10-CM | POA: Diagnosis not present

## 2022-08-10 DIAGNOSIS — Z23 Encounter for immunization: Secondary | ICD-10-CM | POA: Diagnosis not present

## 2022-08-10 DIAGNOSIS — K219 Gastro-esophageal reflux disease without esophagitis: Secondary | ICD-10-CM | POA: Diagnosis not present

## 2022-08-10 DIAGNOSIS — I10 Essential (primary) hypertension: Secondary | ICD-10-CM | POA: Diagnosis not present

## 2022-08-10 DIAGNOSIS — Z6832 Body mass index (BMI) 32.0-32.9, adult: Secondary | ICD-10-CM | POA: Diagnosis not present

## 2022-08-10 DIAGNOSIS — E039 Hypothyroidism, unspecified: Secondary | ICD-10-CM | POA: Diagnosis not present

## 2022-08-10 DIAGNOSIS — G479 Sleep disorder, unspecified: Secondary | ICD-10-CM | POA: Diagnosis not present

## 2022-08-10 DIAGNOSIS — R7303 Prediabetes: Secondary | ICD-10-CM | POA: Diagnosis not present

## 2022-08-10 DIAGNOSIS — I2510 Atherosclerotic heart disease of native coronary artery without angina pectoris: Secondary | ICD-10-CM | POA: Diagnosis not present

## 2022-08-10 DIAGNOSIS — I480 Paroxysmal atrial fibrillation: Secondary | ICD-10-CM | POA: Diagnosis not present

## 2022-08-16 DIAGNOSIS — R7303 Prediabetes: Secondary | ICD-10-CM | POA: Diagnosis not present

## 2022-08-16 DIAGNOSIS — I2510 Atherosclerotic heart disease of native coronary artery without angina pectoris: Secondary | ICD-10-CM | POA: Diagnosis not present

## 2022-08-16 DIAGNOSIS — E559 Vitamin D deficiency, unspecified: Secondary | ICD-10-CM | POA: Diagnosis not present

## 2022-08-16 DIAGNOSIS — E782 Mixed hyperlipidemia: Secondary | ICD-10-CM | POA: Diagnosis not present

## 2022-08-16 DIAGNOSIS — Z6832 Body mass index (BMI) 32.0-32.9, adult: Secondary | ICD-10-CM | POA: Diagnosis not present

## 2022-08-16 DIAGNOSIS — E669 Obesity, unspecified: Secondary | ICD-10-CM | POA: Diagnosis not present

## 2022-08-24 DIAGNOSIS — I480 Paroxysmal atrial fibrillation: Secondary | ICD-10-CM | POA: Diagnosis not present

## 2022-08-24 DIAGNOSIS — Z79899 Other long term (current) drug therapy: Secondary | ICD-10-CM | POA: Diagnosis not present

## 2022-08-24 DIAGNOSIS — Z5181 Encounter for therapeutic drug level monitoring: Secondary | ICD-10-CM | POA: Diagnosis not present

## 2022-09-05 DIAGNOSIS — R7303 Prediabetes: Secondary | ICD-10-CM | POA: Diagnosis not present

## 2022-09-05 DIAGNOSIS — E669 Obesity, unspecified: Secondary | ICD-10-CM | POA: Diagnosis not present

## 2022-09-05 DIAGNOSIS — E782 Mixed hyperlipidemia: Secondary | ICD-10-CM | POA: Diagnosis not present

## 2022-09-05 DIAGNOSIS — I2510 Atherosclerotic heart disease of native coronary artery without angina pectoris: Secondary | ICD-10-CM | POA: Diagnosis not present

## 2022-09-05 DIAGNOSIS — E559 Vitamin D deficiency, unspecified: Secondary | ICD-10-CM | POA: Diagnosis not present

## 2022-09-05 DIAGNOSIS — Z6831 Body mass index (BMI) 31.0-31.9, adult: Secondary | ICD-10-CM | POA: Diagnosis not present

## 2022-09-07 NOTE — Progress Notes (Signed)
Cardiology Office Note:    Date:  09/19/2022   ID:  Sarah Blackwell, DOB [birth year], MRN [id number]  PCP:  Gweneth Dimitri, MD   [facility] Providers Cardiologist:  Donato Schultz, MD     Referring MD: Gweneth Dimitri, MD   Chief Complaint: fatigue  History of Present Illness:    Sarah Blackwell is a very pleasant 67 y.o. female with a hx of atrial fibrillation on flecainide, a fib ablation 12/15/21, nonobstructive CAD on CT, OSA on CPAP, HLD, HTN, obesity, mild MVP, and family history of CAD (father died of heart attack age 29).   Referred to cardiology and seen by Dr. Anne Fu 12/18/2017 for management of mitral valve prolapse.  She reported an episode of atrial fibrillation on 09/26/2016 at dinner with friends. Had some wine. Symptoms lasted for several hours and she spontaneously converted in the emergency department after receiving Eliquis and diltiazem. Thinks she may have had 7 episodes prior to that that were short-lived.  She reported generalized fatigue and was seen in A-fib clinic.  Was encouraged to decrease alcohol intake to perhaps 2 glasses of wine per week and to stop drinking coffee. Was started on flecainide by Dr. Anne Fu.  Echo at that time revealed mild anterior leaflet prolapse, trivial regurgitation.  Diagnosed with mild to moderate OSA in 2020 with AHI of 14.6/h with no significant desaturations.  She was started on auto CPAP. She did not tolerate face mask due to claustrophobia.   Last seen in our clinic on 09/29/20 by Dr. Anne Fu. She was referred to EP for consideration of a fib ablation.   EP has been followed by [facility]. She underwent a fib ablation 12/15/21 at [facility] with no evidence of atrial myopathy during the procedure. Was released on dofetilide and Eliquis. She was prescribed a 14-day monitor at her last office visit on 04/26/2022 for possible return of a fib.   Seen by me on 06/29/22 for evaluation of fatigue. Has not felt well since last Fall late  Sept/early Oct. Respiratory symptoms, including cough at that time, not certain if she had COVID infection at that time but had it prior to becoming ill. Wonders if she has long COVID. Also underwent DCCV in November and a fib ablation in December. Monitor was ordered by EP at [facility] for fatigue, wondering if she has return of a fib. She just mailed it back yesterday. Has not felt like she was in a fib. Occasional chest discomfort, no sustained discomfort, "odd pains." Walks for exercise 3-4 times per week in addition to pilates 1-2 x per week and treads water in a pool. Has noted more shortness of breath with exercise recently. Feels more "wiped out" after exercising which is unusual for her. Recent symptoms include cold like symptoms including productive cough that got initially got better after a 7-day course of doxycycline. Return of symptoms a few days later, placed on 10 day course of doxy which she is still taking. Cough from last fall did improve, but returned recently. No edema, orthopnea, PND, presyncope, or syncope. She was encouraged to monitor symptoms and notify us if cardiac symptoms worsened.   Today, she is here alone for follow-up. Reports she continues to feel like she does not haven any energy. She walks with friends for exercise and does Pilates. Walks 2 to 2-1/2 miles in 40 to 50 minutes, feels breathless when going up an incline. No chest pain, palpitations, presyncope, or syncope. Wears Apple watch. No notification of irregular HR. HR  gets up to 125 bpm with walking. Feels particularly fatigued in the afternoons or after an especially busy day. Sleeps with a mouthguard for OSA. Has monitored her sleep with various apps - some tell her she gets adequate REM but a different app told her she got very limited REM. Wakes herself up snoring if she falls asleep without the mouthguard. She does not monitor BP consistently. No edema, orthopnea, PND. Normal vitamin B12 and d levels per her report. No  reports of arrhythmia on cardiac monitor at [facility].   Past Medical History:  Diagnosis Date   Allergic rhinitis    Anxiety    Arthritis    Cancer (HCC)    skin cancer   Complication of anesthesia    difficulty waking up after surgery as a child.    Fluid retention    Headache    migraines when she was younger   Hyperlipidemia    Hypertension    never has taken medications   Hypothyroidism    PCOS (polycystic ovarian syndrome)    Perimenopausal    PONV (postoperative nausea and vomiting)    Pre-diabetes    Rosacea    Wears glasses     Past Surgical History:  Procedure Laterality Date   COLONOSCOPY     FERTILITY SURGERY     MOHS SURGERY     x 2   TONSILLECTOMY     TOTAL HIP ARTHROPLASTY Right 06/30/2014   Procedure: RIGHT TOTAL HIP ARTHROPLASTY ANTERIOR APPROACH;  Surgeon: Kathryne Hitch, MD;  Location: [facility] OR;  Service: Orthopedics;  Laterality: Right;   TOTAL HIP ARTHROPLASTY Left 03/16/2015   Procedure: LEFT TOTAL HIP ARTHROPLASTY ANTERIOR APPROACH;  Surgeon: Kathryne Hitch, MD;  Location: [facility] OR;  Service: Orthopedics;  Laterality: Left;    Current Medications: Current Meds  Medication Sig   ALPRAZolam (XANAX) 0.5 MG tablet Take 0.5 mg by mouth at bedtime as needed for sleep.   apixaban (ELIQUIS) 5 MG TABS tablet Take 5 mg by mouth 2 (two) times daily.   Cholecalciferol (VITAMIN D3 PO) Take 5,000 Units by mouth daily.   diltiazem (CARDIZEM CD) 120 MG 24 hr capsule TAKE 1 CAPSULE BY MOUTH EVERY DAY AND AS NEEDED UP TO 2 CAPSULES EVERY DAY FOR EPISODES OF AFIB   diltiazem (CARDIZEM) 30 MG tablet Take 30 mg by mouth.   dofetilide (TIKOSYN) 250 MCG capsule Take 250 mcg by mouth 2 (two) times daily.   fluticasone (FLONASE) 50 MCG/ACT nasal spray Place 1-2 sprays into both nostrils daily as needed for allergies.   Ginger, Zingiber officinalis, (GINGER ROOT) 550 MG CAPS Take by mouth.   levothyroxine (SYNTHROID, LEVOTHROID) 75 MCG tablet Take 75 mcg by mouth  daily before breakfast.   liothyronine (CYTOMEL) 5 MCG tablet Take 5 mcg by mouth daily.   losartan (COZAAR) 50 MG tablet Take 25 mg by mouth daily.   Melatonin 10 MG TABS Take by mouth at bedtime.   metFORMIN (GLUCOPHAGE-XR) 500 MG 24 hr tablet Take 1,500 mg by mouth at bedtime. Pt take three tablet with largest meal once daily   Multiple Vitamin (MULTI-VITAMIN) tablet Take 1 tablet by mouth daily.   pantoprazole (PROTONIX) 40 MG tablet Take 40 mg by mouth daily.   rosuvastatin (CRESTOR) 20 MG tablet Take 1 tablet (20 mg total) by mouth daily.   Semaglutide-Weight Management (WEGOVY) 0.5 MG/0.5ML SOAJ Inject 0.5 mg into the skin once a week.   Turmeric 500 MG TABS Take by mouth.  Allergies:   Sulfa antibiotics   Social History   Socioeconomic History   Marital status: Married    Spouse name: Not on file   Number of children: Not on file   Years of education: Not on file   Highest education level: Not on file  Occupational History   Not on file  Tobacco Use   Smoking status: Never   Smokeless tobacco: Never  Vaping Use   Vaping status: Never Used  Substance and Sexual Activity   Alcohol use: Yes    Alcohol/week: 14.0 standard drinks of alcohol    Types: 14 Glasses of wine per week    Comment: 1-2 glasses of wine daily   Drug use: No   Sexual activity: Not on file  Other Topics Concern   Not on file  Social History Narrative   Not on file   Social Determinants of Health   Financial Resource Strain: Not on file  Food Insecurity: Not on file  Transportation Needs: Not on file  Physical Activity: Not on file  Stress: Not on file  Social Connections: Not on file     Family History: The patient's family history includes Alcohol abuse in her paternal grandfather; Anemia in her mother; CVA in her father; Cancer in her father, maternal grandfather, paternal grandfather, and another family member; Heart attack in her maternal grandfather; Hyperlipidemia in her father,  mother, and another family member; Hypertension in her mother and another family member; Stroke in her father and another family member.  ROS:   Please see the history of present illness.    + fatigue  All other systems reviewed and are negative.  Labs/Other Studies Reviewed:    The following studies were reviewed today:  Echo 10/13/20 1. Left ventricular ejection fraction, by estimation, is 60 to 65%. The  left ventricle has normal function. The left ventricle has no regional  wall motion abnormalities. Left ventricular diastolic parameters were  normal.   2. Right ventricular systolic function is normal. The right ventricular  size is normal. There is normal pulmonary artery systolic pressure.   3. The mitral valve leaflets were not seen very well. There was no  prolapse seen on this echo . Marland Kitchen The mitral valve is grossly normal. Trivial  mitral valve regurgitation.   4. The aortic valve is normal in structure. Aortic valve regurgitation is  not visualized.   CT Cardiac Score 04/21/20 Coronary arteries: Normal origins.   Coronary Calcium Score:   Left main: 0   Left anterior descending artery: 22   Left circumflex artery: 0   Right coronary artery: 0   Total: 22   Percentile: 58   Pericardium: Normal.   Aorta: Normal caliber of ascending aorta. No aortic atherosclerosis noted.   Non-cardiac: See separate report from [facility].   IMPRESSION: Coronary calcium score of 22. This was 58th percentile for age-, race-, and sex-matched controls.   ETT 12/27/17 Blood pressure demonstrated a hypertensive response to exercise. There was no ST segment deviation noted during stress.   1. Hypertensive BP response.  2. Normal exercise tolerance.  3. No evidence for ischemia by ST segment analysis.      Recent Labs: No results found for requested labs within last 365 days.  Recent Lipid Panel    Component Value Date/Time   CHOL 158 07/28/2020 0842   TRIG  185 (H) 07/28/2020 0842   HDL 54 07/28/2020 0842   CHOLHDL 2.9 07/28/2020 0842   LDLCALC 73 07/28/2020 1610  Risk Assessment/Calculations:    CHA2DS2-VASc Score = 3   This indicates a 3.2% annual risk of stroke. The patient's score is based upon: CHF History: 0 HTN History: 1 Diabetes History: 0 Stroke History: 0 Vascular Disease History: 0 Age Score: 1 Gender Score: 1          Physical Exam:    VS:  BP 102/60   Pulse 77   Ht 5\' 7"  (1.702 m)   Wt 206 lb (93.4 kg)   SpO2 98%   BMI 32.26 kg/m     Wt Readings from Last 3 Encounters:  09/19/22 206 lb (93.4 kg)  06/29/22 208 lb 6.4 oz (94.5 kg)  09/29/20 210 lb 6.4 oz (95.4 kg)     GEN:  Obese, well developed in no acute distress HEENT: Normal NECK: No JVD; No carotid bruits CARDIAC: RRR, no murmurs, rubs, gallops RESPIRATORY:  Clear to auscultation without rales, wheezing or rhonchi  ABDOMEN: Soft, non-tender, non-distended MUSCULOSKELETAL:  No edema; No deformity. 2+ pedal pulses, equal bilaterally SKIN: Warm and dry NEUROLOGIC:  Alert and oriented x 3 PSYCHIATRIC:  Normal affect   EKG:  EKG is not ordered today        Diagnoses:    1. PAF (paroxysmal atrial fibrillation) (HCC)   2. Coronary artery calcification   3. Hyperlipidemia LDL goal <70   4. Mitral valve disorder   5. Fatigue, unspecified type   6. OSA (obstructive sleep apnea)   7. High risk medication use   8. Essential hypertension     Assessment and Plan:     Fatigue: Fatigue for > 6 months. Symptoms initially started in conjunction with COVID infection but have persisted. She continues to exercise with no chest pain and only mild breathlessness when going up an incline. BP is soft. We will lower losartan dose to see if her symptoms improve. As noted below, consider fatigue as angina equivalent. Will consider coronary CTA if symptoms persist despite improvement in BP.   CAD without angina/Elevated coronary artery calcium score:  Coronary artery calcium score 22 with calcium in LAD (58th percentile) on CT calcium score 04/21/2020.  Lengthy discussion about these results and fatigue as form of angina. No wall motion abnormality on cardiac MR 12/2021. She has fatigue but no chest pain. She reports breathlessness when going up an incline, no dyspnea, orthopnea, or PND. Continues to exercise without significant symptoms. Encouraged her to continue to work on weight loss and gradually increase exercise intensity. We are lowering losartan in an attempt to slightly increase BP to improve fatigue.  Consider coronary CTA if symptoms persist despite improvement in BP.  Mitral valve disorder: Echo 10/2020 with mitral leaflets not seen well, no prolapse and trivial MR.  On Cardiac MR 12/2021 normal mitral valve leaflets with trivial regurgitation and no stenosis. Normal LVEF 59%. No significant murmur on exam. We will continue to monitor clinically for now.   PAF on chronic anticoagulation: S/p a fib ablation. No palpitations, tachycardia to report. She reports no concerning arrhythmia on cardiac monitor at [facility]. No irregular HR on Apple Watch. On Tikosyn for AAD, no acute concerns today. On diltiazem for rate control. On Eliquis 5 mg twice daily which is appropriate dose  for stroke prevention for CHA2DS2-VASc score of 3. No bleeding concerns. Management per [facility] EP.   OSA: Compliant with mouthguard. No problems reported in recent follow-up. Management per Irene Limbo, DDS.   Hypertension: BP is soft. As noted above, we are decreasing losartan from  50 mg daily to 25 mg daily in hopes to improve fatigue by slightly increasing BP. Advised her to closely monitor BP and report if not consistently < 120/80.   Hyperlipidemia LDL goal < 70: LDL 58 on 02/01/22. We will recheck lipid panel today. Continue rosuvastatin.     Disposition: 6 months with Dr. Anne Fu or me  Medication Adjustments/Labs and Tests Ordered: Current medicines are reviewed at  length with the patient today.  Concerns regarding medicines are outlined above.  Orders Placed This Encounter  Procedures   Lipid Profile   Comp Met (CMET)   No orders of the defined types were placed in this encounter.   Patient Instructions  Medication Instructions:   DECREASE Losartan one half (0.5) tablet by mouth ( 25 mg) daily.   *If you need a refill on your cardiac medications before your next appointment, please call your pharmacy*   Lab Work:  TODAY!!!! LIPID/CMET  If you have labs (blood work) drawn today and your tests are completely normal, you will receive your results only by: MyChart Message (if you have MyChart) OR A paper copy in the mail If you have any lab test that is abnormal or we need to change your treatment, we will call you to review the results.   Testing/Procedures:  None ordered.   Follow-Up: At [facility], you and your health needs are our priority.  As part of our continuing mission to provide you with exceptional heart care, we have created designated Provider Care Teams.  These Care Teams include your primary Cardiologist (physician) and Advanced Practice Providers (APPs -  Physician Assistants and Nurse Practitioners) who all work together to provide you with the care you need, when you need it.  We recommend signing up for the patient portal called "MyChart".  Sign up information is provided on this After Visit Summary.  MyChart is used to connect with patients for Virtual Visits (Telemedicine).  Patients are able to view lab/test results, encounter notes, upcoming appointments, etc.  Non-urgent messages can be sent to your provider as well.   To learn more about what you can do with MyChart, go to [email or web address].    Your next appointment:   6 month(s)  Provider:   Donato Schultz, MD     Other Instructions  HOW TO TAKE YOUR BLOOD PRESSURE  Rest 5 minutes before taking your blood pressure. Don't  smoke or drink  caffeinated beverages for at least 30 minutes before. Take your blood pressure before (not after) you eat. Sit comfortably with your back supported and both feet on the floor ( don't cross your legs). Elevate your arm to heart level on a table or a desk. Use the proper sized cuff.  It should fit smoothly and snugly around your bare upper arm.  There should be  Enough room to slip a fingertip under the cuff.  The bottom edge of the cuff should be 1 inch above the crease Of the elbow. Please monitor your blood pressure once daily 2 hours after your am medication. If you blood pressure Consistently remains above 120 (systolic) top number or over 80 ( diastolic) bottom number X 3 days  Consecutively.  Please call our office at [phone] or send Mychart message.     ----Avoid cold medicines with D or DM at the end of them----       Signed, Levi Aland, NP  09/19/2022 10:55 AM    [facility]

## 2022-09-19 ENCOUNTER — Encounter: Payer: Self-pay | Admitting: Nurse Practitioner

## 2022-09-19 ENCOUNTER — Ambulatory Visit: Payer: Medicare HMO | Attending: Nurse Practitioner | Admitting: Nurse Practitioner

## 2022-09-19 VITALS — BP 102/60 | HR 77 | Ht 67.0 in | Wt 206.0 lb

## 2022-09-19 DIAGNOSIS — Z79899 Other long term (current) drug therapy: Secondary | ICD-10-CM | POA: Diagnosis not present

## 2022-09-19 DIAGNOSIS — G4733 Obstructive sleep apnea (adult) (pediatric): Secondary | ICD-10-CM | POA: Diagnosis not present

## 2022-09-19 DIAGNOSIS — I480 Paroxysmal atrial fibrillation: Secondary | ICD-10-CM | POA: Diagnosis not present

## 2022-09-19 DIAGNOSIS — I2584 Coronary atherosclerosis due to calcified coronary lesion: Secondary | ICD-10-CM | POA: Diagnosis not present

## 2022-09-19 DIAGNOSIS — R5383 Other fatigue: Secondary | ICD-10-CM

## 2022-09-19 DIAGNOSIS — E785 Hyperlipidemia, unspecified: Secondary | ICD-10-CM | POA: Diagnosis not present

## 2022-09-19 DIAGNOSIS — I059 Rheumatic mitral valve disease, unspecified: Secondary | ICD-10-CM

## 2022-09-19 DIAGNOSIS — I2510 Atherosclerotic heart disease of native coronary artery without angina pectoris: Secondary | ICD-10-CM | POA: Diagnosis not present

## 2022-09-19 DIAGNOSIS — I10 Essential (primary) hypertension: Secondary | ICD-10-CM | POA: Diagnosis not present

## 2022-09-19 NOTE — Patient Instructions (Signed)
Medication Instructions:   DECREASE Losartan one half (0.5) tablet by mouth ( 25 mg) daily.   *If you need a refill on your cardiac medications before your next appointment, please call your pharmacy*   Lab Work:  TODAY!!!! LIPID/CMET  If you have labs (blood work) drawn today and your tests are completely normal, you will receive your results only by: MyChart Message (if you have MyChart) OR A paper copy in the mail If you have any lab test that is abnormal or we need to change your treatment, we will call you to review the results.   Testing/Procedures:  None ordered.   Follow-Up: At [facility], you and your health needs are our priority.  As part of our continuing mission to provide you with exceptional heart care, we have created designated Provider Care Teams.  These Care Teams include your primary Cardiologist (physician) and Advanced Practice Providers (APPs -  Physician Assistants and Nurse Practitioners) who all work together to provide you with the care you need, when you need it.  We recommend signing up for the patient portal called "MyChart".  Sign up information is provided on this After Visit Summary.  MyChart is used to connect with patients for Virtual Visits (Telemedicine).  Patients are able to view lab/test results, encounter notes, upcoming appointments, etc.  Non-urgent messages can be sent to your provider as well.   To learn more about what you can do with MyChart, go to [email or web address].    Your next appointment:   6 month(s)  Provider:   Donato Schultz, MD     Other Instructions  HOW TO TAKE YOUR BLOOD PRESSURE  Rest 5 minutes before taking your blood pressure. Don't  smoke or drink caffeinated beverages for at least 30 minutes before. Take your blood pressure before (not after) you eat. Sit comfortably with your back supported and both feet on the floor ( don't cross your legs). Elevate your arm to heart level on a table or a  desk. Use the proper sized cuff.  It should fit smoothly and snugly around your bare upper arm.  There should be  Enough room to slip a fingertip under the cuff.  The bottom edge of the cuff should be 1 inch above the crease Of the elbow. Please monitor your blood pressure once daily 2 hours after your am medication. If you blood pressure Consistently remains above 120 (systolic) top number or over 80 ( diastolic) bottom number X 3 days  Consecutively.  Please call our office at [phone] or send Mychart message.     ----Avoid cold medicines with D or DM at the end of them----

## 2022-09-20 DIAGNOSIS — L918 Other hypertrophic disorders of the skin: Secondary | ICD-10-CM | POA: Diagnosis not present

## 2022-09-20 DIAGNOSIS — L820 Inflamed seborrheic keratosis: Secondary | ICD-10-CM | POA: Diagnosis not present

## 2022-09-20 DIAGNOSIS — D2262 Melanocytic nevi of left upper limb, including shoulder: Secondary | ICD-10-CM | POA: Diagnosis not present

## 2022-09-20 DIAGNOSIS — D2272 Melanocytic nevi of left lower limb, including hip: Secondary | ICD-10-CM | POA: Diagnosis not present

## 2022-09-20 DIAGNOSIS — Z85820 Personal history of malignant melanoma of skin: Secondary | ICD-10-CM | POA: Diagnosis not present

## 2022-09-20 DIAGNOSIS — L910 Hypertrophic scar: Secondary | ICD-10-CM | POA: Diagnosis not present

## 2022-09-20 DIAGNOSIS — L649 Androgenic alopecia, unspecified: Secondary | ICD-10-CM | POA: Diagnosis not present

## 2022-09-20 DIAGNOSIS — D225 Melanocytic nevi of trunk: Secondary | ICD-10-CM | POA: Diagnosis not present

## 2022-09-20 DIAGNOSIS — L821 Other seborrheic keratosis: Secondary | ICD-10-CM | POA: Diagnosis not present

## 2022-09-20 DIAGNOSIS — D2261 Melanocytic nevi of right upper limb, including shoulder: Secondary | ICD-10-CM | POA: Diagnosis not present

## 2022-09-20 LAB — COMPREHENSIVE METABOLIC PANEL
ALT: 59 IU/L — ABNORMAL HIGH (ref 0–32)
AST: 39 IU/L (ref 0–40)
Albumin: 4.8 g/dL (ref 3.9–4.9)
Alkaline Phosphatase: 96 IU/L (ref 44–121)
BUN/Creatinine Ratio: 19 (ref 12–28)
BUN: 13 mg/dL (ref 8–27)
Bilirubin Total: 0.5 mg/dL (ref 0.0–1.2)
CO2: 26 mmol/L (ref 20–29)
Calcium: 9.8 mg/dL (ref 8.7–10.3)
Chloride: 105 mmol/L (ref 96–106)
Creatinine, Ser: 0.69 mg/dL (ref 0.57–1.00)
Globulin, Total: 2 g/dL (ref 1.5–4.5)
Glucose: 98 mg/dL (ref 70–99)
Potassium: 4.6 mmol/L (ref 3.5–5.2)
Sodium: 142 mmol/L (ref 134–144)
Total Protein: 6.8 g/dL (ref 6.0–8.5)
eGFR: 96 mL/min/{1.73_m2} (ref >59)

## 2022-09-20 LAB — LIPID PANEL
Chol/HDL Ratio: 2.8 ratio (ref 0.0–4.4)
Cholesterol, Total: 142 mg/dL (ref 100–199)
HDL: 51 mg/dL (ref >39)
LDL Chol Calc (NIH): 66 mg/dL (ref 0–99)
Triglycerides: 147 mg/dL (ref 0–149)
VLDL Cholesterol Cal: 25 mg/dL (ref 5–40)

## 2022-10-03 DIAGNOSIS — E782 Mixed hyperlipidemia: Secondary | ICD-10-CM | POA: Diagnosis not present

## 2022-10-03 DIAGNOSIS — Z6831 Body mass index (BMI) 31.0-31.9, adult: Secondary | ICD-10-CM | POA: Diagnosis not present

## 2022-10-03 DIAGNOSIS — E559 Vitamin D deficiency, unspecified: Secondary | ICD-10-CM | POA: Diagnosis not present

## 2022-10-03 DIAGNOSIS — E669 Obesity, unspecified: Secondary | ICD-10-CM | POA: Diagnosis not present

## 2022-10-03 DIAGNOSIS — R7303 Prediabetes: Secondary | ICD-10-CM | POA: Diagnosis not present

## 2022-10-03 DIAGNOSIS — I2510 Atherosclerotic heart disease of native coronary artery without angina pectoris: Secondary | ICD-10-CM | POA: Diagnosis not present

## 2022-10-19 DIAGNOSIS — E559 Vitamin D deficiency, unspecified: Secondary | ICD-10-CM | POA: Diagnosis not present

## 2022-10-19 DIAGNOSIS — E669 Obesity, unspecified: Secondary | ICD-10-CM | POA: Diagnosis not present

## 2022-10-19 DIAGNOSIS — E782 Mixed hyperlipidemia: Secondary | ICD-10-CM | POA: Diagnosis not present

## 2022-10-19 DIAGNOSIS — Z6831 Body mass index (BMI) 31.0-31.9, adult: Secondary | ICD-10-CM | POA: Diagnosis not present

## 2022-10-19 DIAGNOSIS — R7303 Prediabetes: Secondary | ICD-10-CM | POA: Diagnosis not present

## 2022-10-19 DIAGNOSIS — I2510 Atherosclerotic heart disease of native coronary artery without angina pectoris: Secondary | ICD-10-CM | POA: Diagnosis not present

## 2022-10-19 DIAGNOSIS — R748 Abnormal levels of other serum enzymes: Secondary | ICD-10-CM | POA: Diagnosis not present

## 2022-11-13 DIAGNOSIS — R7303 Prediabetes: Secondary | ICD-10-CM | POA: Diagnosis not present

## 2022-11-13 DIAGNOSIS — E039 Hypothyroidism, unspecified: Secondary | ICD-10-CM | POA: Diagnosis not present

## 2022-11-13 DIAGNOSIS — E782 Mixed hyperlipidemia: Secondary | ICD-10-CM | POA: Diagnosis not present

## 2022-11-13 DIAGNOSIS — G479 Sleep disorder, unspecified: Secondary | ICD-10-CM | POA: Diagnosis not present

## 2022-11-13 DIAGNOSIS — Z01419 Encounter for gynecological examination (general) (routine) without abnormal findings: Secondary | ICD-10-CM | POA: Diagnosis not present

## 2022-11-13 DIAGNOSIS — I480 Paroxysmal atrial fibrillation: Secondary | ICD-10-CM | POA: Diagnosis not present

## 2022-11-13 DIAGNOSIS — N952 Postmenopausal atrophic vaginitis: Secondary | ICD-10-CM | POA: Diagnosis not present

## 2022-11-13 DIAGNOSIS — D6869 Other thrombophilia: Secondary | ICD-10-CM | POA: Diagnosis not present

## 2022-11-13 DIAGNOSIS — Z0000 Encounter for general adult medical examination without abnormal findings: Secondary | ICD-10-CM | POA: Diagnosis not present

## 2022-11-13 DIAGNOSIS — I2510 Atherosclerotic heart disease of native coronary artery without angina pectoris: Secondary | ICD-10-CM | POA: Diagnosis not present

## 2022-11-13 DIAGNOSIS — R7989 Other specified abnormal findings of blood chemistry: Secondary | ICD-10-CM | POA: Diagnosis not present

## 2022-11-13 DIAGNOSIS — I10 Essential (primary) hypertension: Secondary | ICD-10-CM | POA: Diagnosis not present

## 2022-11-15 DIAGNOSIS — Z23 Encounter for immunization: Secondary | ICD-10-CM | POA: Diagnosis not present

## 2022-11-15 DIAGNOSIS — I10 Essential (primary) hypertension: Secondary | ICD-10-CM | POA: Diagnosis not present

## 2022-11-15 DIAGNOSIS — E6609 Other obesity due to excess calories: Secondary | ICD-10-CM | POA: Diagnosis not present

## 2022-11-15 DIAGNOSIS — I480 Paroxysmal atrial fibrillation: Secondary | ICD-10-CM | POA: Diagnosis not present

## 2022-11-15 DIAGNOSIS — Z6832 Body mass index (BMI) 32.0-32.9, adult: Secondary | ICD-10-CM | POA: Diagnosis not present

## 2022-11-15 DIAGNOSIS — Z9889 Other specified postprocedural states: Secondary | ICD-10-CM | POA: Diagnosis not present

## 2022-11-15 DIAGNOSIS — Z8679 Personal history of other diseases of the circulatory system: Secondary | ICD-10-CM | POA: Diagnosis not present

## 2022-11-22 DIAGNOSIS — R7989 Other specified abnormal findings of blood chemistry: Secondary | ICD-10-CM | POA: Diagnosis not present

## 2022-11-24 ENCOUNTER — Telehealth: Payer: Self-pay

## 2022-11-24 DIAGNOSIS — Z860101 Personal history of adenomatous and serrated colon polyps: Secondary | ICD-10-CM | POA: Diagnosis not present

## 2022-11-24 DIAGNOSIS — K219 Gastro-esophageal reflux disease without esophagitis: Secondary | ICD-10-CM | POA: Diagnosis not present

## 2022-11-24 DIAGNOSIS — I4891 Unspecified atrial fibrillation: Secondary | ICD-10-CM | POA: Diagnosis not present

## 2022-11-24 NOTE — Telephone Encounter (Signed)
   Pre-operative Risk Assessment    Patient Name: Sarah Blackwell  DOB: Mar 19, 1955 MRN: [id number]      Request for Surgical Clearance    Procedure:   COLONOSCOPY/ ENDOSCOPY   Date of Surgery:  Clearance 01/09/23                                 Surgeon:  DR. Ewing Schlein Surgeon's Group or Practice Name:  [facility] Phone number:  [phone] Fax number:  [phone]   Type of Clearance Requested:   - Pharmacy:  Hold Apixaban (Eliquis) HOLD 2 DAYS PRIOR TO PROCEDURE    Type of Anesthesia:   PROPOFOL    Additional requests/questions:    SignedMichaelle Copas   11/24/2022, 4:25 PM

## 2022-11-26 NOTE — Telephone Encounter (Signed)
Patient with diagnosis of atrial fibrillation on Eliuqis for anticoagulation.    Procedure:   COLONOSCOPY/ ENDOSCOPY    Date of Surgery:  Clearance 01/09/23   CHA2DS2-VASc Score = 3   This indicates a 3.2% annual risk of stroke. The patient's score is based upon: CHF History: 0 HTN History: 1 Diabetes History: 0 Stroke History: 0 Vascular Disease History: 0 Age Score: 1 Gender Score: 1    CrCl >100 Platelet count 188  Per office protocol, patient can hold Eliquis for 2 days prior to procedure.   Patient will not need bridging with Lovenox (enoxaparin) around procedure.  **This guidance is not considered finalized until pre-operative APP has relayed final recommendations.**

## 2022-11-27 DIAGNOSIS — Z1231 Encounter for screening mammogram for malignant neoplasm of breast: Secondary | ICD-10-CM | POA: Diagnosis not present

## 2022-11-27 NOTE — Telephone Encounter (Signed)
   Patient Name: Sarah Blackwell  DOB: 1955/07/24 MRN: [id number]  Primary Cardiologist: Donato Schultz, MD  Clinical pharmacists have reviewed the patient's past medical history, labs, and current medications as part of preoperative protocol coverage. The following recommendations have been made:   Per office protocol, patient can hold Eliquis for 2 days prior to procedure.   Patient will not need bridging with Lovenox (enoxaparin) around procedure.  I will route this recommendation to the requesting party via Epic fax function and remove from pre-op pool.  Please call with questions.  Napoleon Form, Leodis Rains, NP 11/27/2022, 8:07 AM

## 2022-11-30 DIAGNOSIS — I480 Paroxysmal atrial fibrillation: Secondary | ICD-10-CM | POA: Diagnosis not present

## 2022-12-14 DIAGNOSIS — Z6831 Body mass index (BMI) 31.0-31.9, adult: Secondary | ICD-10-CM | POA: Diagnosis not present

## 2022-12-14 DIAGNOSIS — E782 Mixed hyperlipidemia: Secondary | ICD-10-CM | POA: Diagnosis not present

## 2022-12-14 DIAGNOSIS — I2510 Atherosclerotic heart disease of native coronary artery without angina pectoris: Secondary | ICD-10-CM | POA: Diagnosis not present

## 2022-12-14 DIAGNOSIS — E559 Vitamin D deficiency, unspecified: Secondary | ICD-10-CM | POA: Diagnosis not present

## 2022-12-14 DIAGNOSIS — E669 Obesity, unspecified: Secondary | ICD-10-CM | POA: Diagnosis not present

## 2022-12-14 DIAGNOSIS — R7303 Prediabetes: Secondary | ICD-10-CM | POA: Diagnosis not present

## 2022-12-19 DIAGNOSIS — I480 Paroxysmal atrial fibrillation: Secondary | ICD-10-CM | POA: Diagnosis not present

## 2022-12-28 DIAGNOSIS — R69 Illness, unspecified: Secondary | ICD-10-CM | POA: Diagnosis not present

## 2023-01-19 DIAGNOSIS — D2239 Melanocytic nevi of other parts of face: Secondary | ICD-10-CM | POA: Diagnosis not present

## 2023-01-19 DIAGNOSIS — L910 Hypertrophic scar: Secondary | ICD-10-CM | POA: Diagnosis not present

## 2023-01-19 DIAGNOSIS — D485 Neoplasm of uncertain behavior of skin: Secondary | ICD-10-CM | POA: Diagnosis not present

## 2023-01-19 DIAGNOSIS — L649 Androgenic alopecia, unspecified: Secondary | ICD-10-CM | POA: Diagnosis not present

## 2023-02-13 DIAGNOSIS — I2510 Atherosclerotic heart disease of native coronary artery without angina pectoris: Secondary | ICD-10-CM | POA: Diagnosis not present

## 2023-02-13 DIAGNOSIS — R7303 Prediabetes: Secondary | ICD-10-CM | POA: Diagnosis not present

## 2023-02-13 DIAGNOSIS — E669 Obesity, unspecified: Secondary | ICD-10-CM | POA: Diagnosis not present

## 2023-02-13 DIAGNOSIS — G4730 Sleep apnea, unspecified: Secondary | ICD-10-CM | POA: Diagnosis not present

## 2023-02-13 DIAGNOSIS — E782 Mixed hyperlipidemia: Secondary | ICD-10-CM | POA: Diagnosis not present

## 2023-02-13 DIAGNOSIS — Z6831 Body mass index (BMI) 31.0-31.9, adult: Secondary | ICD-10-CM | POA: Diagnosis not present

## 2023-02-13 DIAGNOSIS — E559 Vitamin D deficiency, unspecified: Secondary | ICD-10-CM | POA: Diagnosis not present

## 2023-02-14 DIAGNOSIS — E782 Mixed hyperlipidemia: Secondary | ICD-10-CM | POA: Diagnosis not present

## 2023-02-14 DIAGNOSIS — I10 Essential (primary) hypertension: Secondary | ICD-10-CM | POA: Diagnosis not present

## 2023-02-14 DIAGNOSIS — R7303 Prediabetes: Secondary | ICD-10-CM | POA: Diagnosis not present

## 2023-02-14 DIAGNOSIS — G479 Sleep disorder, unspecified: Secondary | ICD-10-CM | POA: Diagnosis not present

## 2023-02-14 DIAGNOSIS — I480 Paroxysmal atrial fibrillation: Secondary | ICD-10-CM | POA: Diagnosis not present

## 2023-02-14 DIAGNOSIS — Z6832 Body mass index (BMI) 32.0-32.9, adult: Secondary | ICD-10-CM | POA: Diagnosis not present

## 2023-02-14 DIAGNOSIS — E669 Obesity, unspecified: Secondary | ICD-10-CM | POA: Diagnosis not present

## 2023-02-14 DIAGNOSIS — Z7184 Encounter for health counseling related to travel: Secondary | ICD-10-CM | POA: Diagnosis not present

## 2023-02-14 DIAGNOSIS — I2510 Atherosclerotic heart disease of native coronary artery without angina pectoris: Secondary | ICD-10-CM | POA: Diagnosis not present

## 2023-02-14 DIAGNOSIS — K219 Gastro-esophageal reflux disease without esophagitis: Secondary | ICD-10-CM | POA: Diagnosis not present

## 2023-02-14 DIAGNOSIS — M159 Polyosteoarthritis, unspecified: Secondary | ICD-10-CM | POA: Diagnosis not present

## 2023-02-14 DIAGNOSIS — E039 Hypothyroidism, unspecified: Secondary | ICD-10-CM | POA: Diagnosis not present

## 2023-03-23 DIAGNOSIS — L820 Inflamed seborrheic keratosis: Secondary | ICD-10-CM | POA: Diagnosis not present

## 2023-03-23 DIAGNOSIS — D1721 Benign lipomatous neoplasm of skin and subcutaneous tissue of right arm: Secondary | ICD-10-CM | POA: Diagnosis not present

## 2023-03-23 DIAGNOSIS — L570 Actinic keratosis: Secondary | ICD-10-CM | POA: Diagnosis not present

## 2023-03-23 DIAGNOSIS — L821 Other seborrheic keratosis: Secondary | ICD-10-CM | POA: Diagnosis not present

## 2023-03-23 DIAGNOSIS — D225 Melanocytic nevi of trunk: Secondary | ICD-10-CM | POA: Diagnosis not present

## 2023-03-23 DIAGNOSIS — D171 Benign lipomatous neoplasm of skin and subcutaneous tissue of trunk: Secondary | ICD-10-CM | POA: Diagnosis not present

## 2023-03-23 DIAGNOSIS — L649 Androgenic alopecia, unspecified: Secondary | ICD-10-CM | POA: Diagnosis not present

## 2023-04-02 ENCOUNTER — Ambulatory Visit: Payer: Medicare HMO | Attending: Cardiology | Admitting: Cardiology

## 2023-04-02 ENCOUNTER — Encounter: Payer: Self-pay | Admitting: Cardiology

## 2023-04-02 VITALS — BP 120/80 | HR 58 | Ht 68.0 in | Wt 209.4 lb

## 2023-04-02 DIAGNOSIS — G4733 Obstructive sleep apnea (adult) (pediatric): Secondary | ICD-10-CM | POA: Diagnosis not present

## 2023-04-02 DIAGNOSIS — I480 Paroxysmal atrial fibrillation: Secondary | ICD-10-CM | POA: Diagnosis not present

## 2023-04-02 DIAGNOSIS — I2510 Atherosclerotic heart disease of native coronary artery without angina pectoris: Secondary | ICD-10-CM

## 2023-04-02 DIAGNOSIS — E785 Hyperlipidemia, unspecified: Secondary | ICD-10-CM | POA: Diagnosis not present

## 2023-04-02 NOTE — Progress Notes (Signed)
 Cardiology Office Note:  .   Date:  04/02/2023  ID:  Sarah Blackwell, DOB 02-19-1955, MRN [id number] PCP: Gweneth Dimitri, MD  [facility] Providers Cardiologist:  Donato Schultz, MD     History of Present Illness: .   Sarah Blackwell is a 68 y.o. female Discussed the use of AI scribe software for clinical note transcription with the patient, who gave verbal consent to proceed.  History of Present Illness Sarah Blackwell is a 68 year old female with paroxysmal atrial fibrillation who presents for follow-up after atrial fibrillation ablation.  She is status post atrial fibrillation ablation performed in December 2023. She feels much better since the procedure and notes that she was in atrial fibrillation for about six weeks prior to cardioversion and subsequent ablation. She is currently on Eliquis 5 mg twice a day for anticoagulation, with a CHADS-VASc score of three. She has only taken diltiazem 120 mg once since the ablation, as needed for atrial fibrillation symptoms.  She has coronary artery disease without angina and an elevated coronary calcium score of 22 from a coronary CT on April 21, 2020. She is concerned about the calcium score and inquires about its implications. She is on Crestor, which helps with plaque stabilization.  She has a history of hypertension and was experiencing low blood pressure, so she reduced her losartan dose to 12.5 mg once a day, which has improved her symptoms of sluggishness. She also takes metoprolol XL 25 mg twice a day.  She has a history of obstructive sleep apnea and is compliant with using a mouth guard. She reports ongoing fatigue but does not mention any new symptoms related to sleep apnea.  She recently discontinued turmeric due to potential interactions with Eliquis and started taking tart cherry capsules for joint pain, which has improved her joint pain and sleep. She inquires about the safety of tart cherry and fish oil with her current  medications. She uses topical minoxidil for hair and questions if it interacts with her medications. She also notes a 'lumpy place' in her neck and plans to discuss it with her primary doctor.      ROS: No Cp   Studies Reviewed: Marland Kitchen   EKG Interpretation Date/Time:  Monday April 02 2023 08:08:27 EDT Ventricular Rate:  58 PR Interval:  184 QRS Duration:  78 QT Interval:  438 QTC Calculation: 429 R Axis:   40  Text Interpretation: Sinus bradycardia Nonspecific ST abnormality When compared with ECG of 29-Jun-2022 08:43, No significant change since last tracing Confirmed by Donato Schultz ([id number]) on 04/02/2023 8:26:41 AM    Results LABS LDL cholesterol: 60 (01/2023) Hb: 12.5 (01/2023) Cr: 0.7 (01/2023)  RADIOLOGY Coronary CT: Elevated coronary calcium score of 22 (04/21/2020) Cardiac MR: Normal mitral valve leaflets with trivial MR (2023)  DIAGNOSTIC Echocardiography: Mitral valve showing no prolapse (2022) Risk Assessment/Calculations:    CHA2DS2-VASc Score = 3   This indicates a 3.2% annual risk of stroke. The patient's score is based upon: CHF History: 0 HTN History: 1 Diabetes History: 0 Stroke History: 0 Vascular Disease History: 0 Age Score: 1 Gender Score: 1            Physical Exam:   VS:  BP 120/80   Pulse (!) 58   Ht 5\' 8"  (1.727 m)   Wt 209 lb 6.4 oz (95 kg)   SpO2 97%   BMI 31.84 kg/m    Wt Readings from Last 3 Encounters:  04/02/23 209 lb 6.4 oz (95 kg)  09/19/22 206 lb (93.4 kg)  06/29/22 208 lb 6.4 oz (94.5 kg)    GEN: Well nourished, well developed in no acute distress NECK: No JVD; No carotid bruits CARDIAC: RRR, no murmurs, no rubs, no gallops RESPIRATORY:  Clear to auscultation without rales, wheezing or rhonchi  ABDOMEN: Soft, non-tender, non-distended EXTREMITIES:  No edema; No deformity   ASSESSMENT AND PLAN: .    Assessment and Plan Assessment & Plan Paroxysmal Atrial Fibrillation Status post atrial fibrillation ablation in  December 2023. Currently on Eliquis 5 mg twice daily for anticoagulation with a CHADS-VASc score of 3. She reports improvement post-ablation. Continuation of Eliquis is advised due to potential AFib recurrence and stroke risk, despite an 85% ablation success rate. Bleeding risks with Eliquis were discussed, and she was advised to avoid supplements like turmeric and fish oil that may increase bleeding risk. The electrophysiologist suggested potential discontinuation of Eliquis as she approaches 72, but continuation is recommended for now due to the risk of asymptomatic AFib and stroke. - Continue Eliquis 5 mg twice daily - Avoid turmeric and fish oil supplements - Discuss any changes in symptoms or concerns with the electrophysiologist  Coronary Artery Disease (CAD) CAD without angina, with an elevated coronary calcium score of 22 from a coronary CT in April 2022. She is on Crestor to stabilize plaque and reduce inflammation. The calcium score indicates plaque presence but not at high-risk level. Focus is on prevention and stabilization to avoid rupture and potential myocardial infarction. The risk of plaque rupture and myocardial infarction was discussed, emphasizing the importance of a healthy lifestyle to prevent plaque progression. - Continue Crestor - Maintain a Mediterranean diet and regular exercise - Monitor for symptoms such as chest pain or dyspnea  Hypertension Blood pressure is well-controlled at 120/80 mmHg with losartan 12.5 mg once daily. - Continue losartan 12.5 mg once daily - Monitor blood pressure regularly  Obstructive Sleep Apnea Compliant with using a mouth guard for obstructive sleep apnea. No further issues or changes were discussed regarding this condition.  Joint Pain Previously used turmeric for joint pain but discontinued due to bleeding risk with Eliquis. She has started taking tart cherry capsules, which have improved her joint pain and sleep quality. No adverse  effects reported. - Continue tart cherry capsules - Monitor for any signs of bleeding           Signed, Donato Schultz, MD

## 2023-04-02 NOTE — Patient Instructions (Signed)
 Medication Instructions:  The current medical regimen is effective;  continue present plan and medications.  *If you need a refill on your cardiac medications before your next appointment, please call your pharmacy*  Follow-Up: At [facility], you and your health needs are our priority.  As part of our continuing mission to provide you with exceptional heart care, we have created designated Provider Care Teams.  These Care Teams include your primary Cardiologist (physician) and Advanced Practice Providers (APPs -  Physician Assistants and Nurse Practitioners) who all work together to provide you with the care you need, when you need it.  We recommend signing up for the patient portal called "MyChart".  Sign up information is provided on this After Visit Summary.  MyChart is used to connect with patients for Virtual Visits (Telemedicine).  Patients are able to view lab/test results, encounter notes, upcoming appointments, etc.  Non-urgent messages can be sent to your provider as well.   To learn more about what you can do with MyChart, go to [email or web address].    Your next appointment:   1 year(s)  Provider:   Jari Favre, PA-C, Ronie Spies, PA-C, Robin Searing, NP, Eligha Bridegroom, NP, Tereso Newcomer, PA-C, or Perlie Gold, PA-C     Then, Donato Schultz, MD will plan to see you again in 2 year(s).      1st Floor: - Lobby - Registration  - Pharmacy  - Lab - Cafe  2nd Floor: - PV Lab - Diagnostic Testing (echo, CT, nuclear med)  3rd Floor: - Vacant  4th Floor: - TCTS (cardiothoracic surgery) - AFib Clinic - Structural Heart Clinic - Vascular Surgery  - Vascular Ultrasound  5th Floor: - HeartCare Cardiology (general and EP) - Clinical Pharmacy for coumadin, hypertension, lipid, weight-loss medications, and med management appointments    Valet parking services will be available as well.

## 2023-04-04 DIAGNOSIS — Z6832 Body mass index (BMI) 32.0-32.9, adult: Secondary | ICD-10-CM | POA: Diagnosis not present

## 2023-04-04 DIAGNOSIS — R221 Localized swelling, mass and lump, neck: Secondary | ICD-10-CM | POA: Diagnosis not present

## 2023-04-04 DIAGNOSIS — J301 Allergic rhinitis due to pollen: Secondary | ICD-10-CM | POA: Diagnosis not present

## 2023-04-09 DIAGNOSIS — L659 Nonscarring hair loss, unspecified: Secondary | ICD-10-CM | POA: Diagnosis not present

## 2023-04-10 DIAGNOSIS — Z860100 Personal history of colon polyps, unspecified: Secondary | ICD-10-CM | POA: Diagnosis not present

## 2023-04-10 DIAGNOSIS — K449 Diaphragmatic hernia without obstruction or gangrene: Secondary | ICD-10-CM | POA: Diagnosis not present

## 2023-04-10 DIAGNOSIS — D120 Benign neoplasm of cecum: Secondary | ICD-10-CM | POA: Diagnosis not present

## 2023-04-10 DIAGNOSIS — K2289 Other specified disease of esophagus: Secondary | ICD-10-CM | POA: Diagnosis not present

## 2023-04-10 DIAGNOSIS — K222 Esophageal obstruction: Secondary | ICD-10-CM | POA: Diagnosis not present

## 2023-04-10 DIAGNOSIS — R1310 Dysphagia, unspecified: Secondary | ICD-10-CM | POA: Diagnosis not present

## 2023-04-10 DIAGNOSIS — Z09 Encounter for follow-up examination after completed treatment for conditions other than malignant neoplasm: Secondary | ICD-10-CM | POA: Diagnosis not present

## 2023-04-12 DIAGNOSIS — D120 Benign neoplasm of cecum: Secondary | ICD-10-CM | POA: Diagnosis not present

## 2023-04-12 DIAGNOSIS — K2289 Other specified disease of esophagus: Secondary | ICD-10-CM | POA: Diagnosis not present

## 2023-04-23 DIAGNOSIS — L659 Nonscarring hair loss, unspecified: Secondary | ICD-10-CM | POA: Diagnosis not present

## 2023-04-30 DIAGNOSIS — Z6831 Body mass index (BMI) 31.0-31.9, adult: Secondary | ICD-10-CM | POA: Diagnosis not present

## 2023-04-30 DIAGNOSIS — E669 Obesity, unspecified: Secondary | ICD-10-CM | POA: Diagnosis not present

## 2023-04-30 DIAGNOSIS — E559 Vitamin D deficiency, unspecified: Secondary | ICD-10-CM | POA: Diagnosis not present

## 2023-04-30 DIAGNOSIS — G4730 Sleep apnea, unspecified: Secondary | ICD-10-CM | POA: Diagnosis not present

## 2023-04-30 DIAGNOSIS — I2510 Atherosclerotic heart disease of native coronary artery without angina pectoris: Secondary | ICD-10-CM | POA: Diagnosis not present

## 2023-04-30 DIAGNOSIS — E782 Mixed hyperlipidemia: Secondary | ICD-10-CM | POA: Diagnosis not present

## 2023-04-30 DIAGNOSIS — R7303 Prediabetes: Secondary | ICD-10-CM | POA: Diagnosis not present

## 2023-06-07 DIAGNOSIS — Z7901 Long term (current) use of anticoagulants: Secondary | ICD-10-CM | POA: Diagnosis not present

## 2023-06-07 DIAGNOSIS — Z8679 Personal history of other diseases of the circulatory system: Secondary | ICD-10-CM | POA: Diagnosis not present

## 2023-06-07 DIAGNOSIS — I480 Paroxysmal atrial fibrillation: Secondary | ICD-10-CM | POA: Diagnosis not present

## 2023-06-07 DIAGNOSIS — Z9889 Other specified postprocedural states: Secondary | ICD-10-CM | POA: Diagnosis not present

## 2023-06-27 DIAGNOSIS — H2513 Age-related nuclear cataract, bilateral: Secondary | ICD-10-CM | POA: Diagnosis not present

## 2023-06-27 DIAGNOSIS — H04123 Dry eye syndrome of bilateral lacrimal glands: Secondary | ICD-10-CM | POA: Diagnosis not present

## 2023-06-27 DIAGNOSIS — H5203 Hypermetropia, bilateral: Secondary | ICD-10-CM | POA: Diagnosis not present

## 2023-07-25 ENCOUNTER — Ambulatory Visit: Attending: Cardiology

## 2023-07-25 ENCOUNTER — Telehealth: Payer: Self-pay | Admitting: Cardiology

## 2023-07-25 DIAGNOSIS — R002 Palpitations: Secondary | ICD-10-CM

## 2023-07-25 NOTE — Telephone Encounter (Signed)
 Pt reports that she if feeling skipped beats and palpitations.(Definitely an irregular heart beat) It has been going on- Coming and going- for a few weeks and she has been trying to monitor it and see if there is anything that makes it happen. She cannot figure out what is causing it. She mostly feels it when she is at rest.  It does not seem to be related to caffeine- she does not drink much caffeine. She is not short of breath or dizzy when this happens.   Made an appointment with an APP for 07/31/23.

## 2023-07-25 NOTE — Progress Notes (Unsigned)
 Enrolled patient for a 7 day Zio XT monitor to be mailed to patients home.

## 2023-07-25 NOTE — Telephone Encounter (Signed)
 Jeffrie Oneil BROCKS, MD to Me  [facility] Triage  Theotis Sharlet PARAS, RN (Selected Message) 07/25/23  3:38 PM Let's do 7 days. Thanks Oneil Jeffrie, MD  Me(Katie) to Jeffrie Oneil BROCKS, MD  [facility] Triage  Theotis Sharlet PARAS, RN 07/25/23  2:42 PM How long do you want her to wear the monitor?   Jeffrie Oneil BROCKS, MD to [facility] Triage  Theotis Sharlet PARAS, RN 07/25/23  2:08 PM I agree with proceeding with Zio patch monitor.  Okay to schedule appointment after that.  Diagnosis palpitations. Oneil Jeffrie, MD  Left message that Dr Jeffrie would like for her to wear a monitor for 7 day, then have an appointment after the monitor is finished. The monitor is mailed to your house. Will put instructions in MyChart as well.  Therefore, I will cancel the appointment that was recently made. We will need to make a new appt for follow up after the monitor is resulted.   Pt will need a f/u appointment scheduled in a month. Canceled previous appt, no longer needed.

## 2023-07-25 NOTE — Telephone Encounter (Signed)
 Patient c/o Palpitations:  STAT if patient reporting lightheadedness, shortness of breath, or chest pain  How long have you had palpitations/irregular HR/ Afib? Are you having the symptoms now?   No  Are you currently experiencing lightheadedness, SOB or CP?   No  Do you have a history of afib (atrial fibrillation) or irregular heart rhythm?   Yes  Have you checked your BP or HR? (document readings if available):   BP 117/76  HR 63-70  Are you experiencing any other symptoms?  Slight headache, tiredness   Patient stated for the last couple of weeks she has been having palpitations.  Patient stated her last EKG had irregular/skipped beat which she is concerned may have become worse.

## 2023-07-31 ENCOUNTER — Ambulatory Visit: Admitting: Nurse Practitioner

## 2023-08-11 DIAGNOSIS — R002 Palpitations: Secondary | ICD-10-CM | POA: Diagnosis not present

## 2023-08-15 NOTE — Telephone Encounter (Signed)
 Monitor results are back and in Dr Jeffrie in-basket to be read.  Will contact pt once this has occurred.

## 2023-08-20 DIAGNOSIS — L659 Nonscarring hair loss, unspecified: Secondary | ICD-10-CM | POA: Diagnosis not present

## 2023-08-20 DIAGNOSIS — E669 Obesity, unspecified: Secondary | ICD-10-CM | POA: Diagnosis not present

## 2023-08-20 DIAGNOSIS — R002 Palpitations: Secondary | ICD-10-CM

## 2023-08-20 DIAGNOSIS — E039 Hypothyroidism, unspecified: Secondary | ICD-10-CM | POA: Diagnosis not present

## 2023-08-20 DIAGNOSIS — F419 Anxiety disorder, unspecified: Secondary | ICD-10-CM | POA: Diagnosis not present

## 2023-08-20 DIAGNOSIS — E66811 Obesity, class 1: Secondary | ICD-10-CM | POA: Diagnosis not present

## 2023-08-20 DIAGNOSIS — I480 Paroxysmal atrial fibrillation: Secondary | ICD-10-CM | POA: Diagnosis not present

## 2023-08-20 DIAGNOSIS — R7303 Prediabetes: Secondary | ICD-10-CM | POA: Diagnosis not present

## 2023-08-20 DIAGNOSIS — G479 Sleep disorder, unspecified: Secondary | ICD-10-CM | POA: Diagnosis not present

## 2023-08-20 DIAGNOSIS — I10 Essential (primary) hypertension: Secondary | ICD-10-CM | POA: Diagnosis not present

## 2023-08-20 DIAGNOSIS — E559 Vitamin D deficiency, unspecified: Secondary | ICD-10-CM | POA: Diagnosis not present

## 2023-08-20 DIAGNOSIS — E782 Mixed hyperlipidemia: Secondary | ICD-10-CM | POA: Diagnosis not present

## 2023-08-20 DIAGNOSIS — Z6831 Body mass index (BMI) 31.0-31.9, adult: Secondary | ICD-10-CM | POA: Diagnosis not present

## 2023-08-21 ENCOUNTER — Ambulatory Visit: Payer: Self-pay | Admitting: Cardiology

## 2023-08-24 ENCOUNTER — Encounter: Payer: Self-pay | Admitting: *Deleted

## 2023-08-24 NOTE — Telephone Encounter (Signed)
 Pt is aware of Dr Jeffrie comments.  Letter sent to Dr Kelly Comer [facility] as requested.  Phone # [phone] Fax [phone]

## 2023-10-19 ENCOUNTER — Telehealth: Payer: Self-pay | Admitting: Cardiology

## 2023-10-19 DIAGNOSIS — I493 Ventricular premature depolarization: Secondary | ICD-10-CM

## 2023-10-19 NOTE — Telephone Encounter (Signed)
 reviewed with Dr Jeffrie who ordered EP referral for PVCs.    Order placed

## 2023-10-19 NOTE — Telephone Encounter (Signed)
 Pt feeling more frequent PVCs, occurring daily now. She is fatigued, but more so the past couple weeks. Aware will discuss further with Dr. Jeffrie and let he know what he recommends.  Pt agreeable to plan.

## 2023-10-19 NOTE — Telephone Encounter (Signed)
 Patient c/o Palpitations:  STAT if patient reporting lightheadedness, shortness of breath, or chest pain  How long have you had palpitations/irregular HR/ Afib? Are you having the symptoms now?   yes  Are you currently experiencing lightheadedness, SOB or CP?   no  Do you have a history of afib (atrial fibrillation) or irregular heart rhythm?   Yes  Have you checked your BP or HR? (document readings if available):   Not done today but this week her BP was 120/80 and HR 72  Are you experiencing any other symptoms?   Really tired   Patient stated she tried to get an appointment with her EP but was unsuccessful.  Patient noted her insurance will now not cover her EP visit.  Patient stated she has been having PVCs off and on every day and wants advice on next steps.

## 2023-10-29 DIAGNOSIS — L659 Nonscarring hair loss, unspecified: Secondary | ICD-10-CM | POA: Diagnosis not present

## 2023-10-30 DIAGNOSIS — L649 Androgenic alopecia, unspecified: Secondary | ICD-10-CM | POA: Diagnosis not present

## 2023-10-30 DIAGNOSIS — D1801 Hemangioma of skin and subcutaneous tissue: Secondary | ICD-10-CM | POA: Diagnosis not present

## 2023-10-30 DIAGNOSIS — L820 Inflamed seborrheic keratosis: Secondary | ICD-10-CM | POA: Diagnosis not present

## 2023-10-30 DIAGNOSIS — D2262 Melanocytic nevi of left upper limb, including shoulder: Secondary | ICD-10-CM | POA: Diagnosis not present

## 2023-10-30 DIAGNOSIS — D2261 Melanocytic nevi of right upper limb, including shoulder: Secondary | ICD-10-CM | POA: Diagnosis not present

## 2023-10-30 DIAGNOSIS — D225 Melanocytic nevi of trunk: Secondary | ICD-10-CM | POA: Diagnosis not present

## 2023-10-30 DIAGNOSIS — L821 Other seborrheic keratosis: Secondary | ICD-10-CM | POA: Diagnosis not present

## 2023-10-30 DIAGNOSIS — Z85820 Personal history of malignant melanoma of skin: Secondary | ICD-10-CM | POA: Diagnosis not present

## 2023-11-15 DIAGNOSIS — Z0000 Encounter for general adult medical examination without abnormal findings: Secondary | ICD-10-CM | POA: Diagnosis not present

## 2023-11-15 DIAGNOSIS — E782 Mixed hyperlipidemia: Secondary | ICD-10-CM | POA: Diagnosis not present

## 2023-11-15 DIAGNOSIS — I10 Essential (primary) hypertension: Secondary | ICD-10-CM | POA: Diagnosis not present

## 2023-11-15 DIAGNOSIS — M7989 Other specified soft tissue disorders: Secondary | ICD-10-CM | POA: Diagnosis not present

## 2023-11-15 DIAGNOSIS — Z6832 Body mass index (BMI) 32.0-32.9, adult: Secondary | ICD-10-CM | POA: Diagnosis not present

## 2023-11-15 DIAGNOSIS — E2839 Other primary ovarian failure: Secondary | ICD-10-CM | POA: Diagnosis not present

## 2023-11-15 DIAGNOSIS — E039 Hypothyroidism, unspecified: Secondary | ICD-10-CM | POA: Diagnosis not present

## 2023-11-15 DIAGNOSIS — Z79899 Other long term (current) drug therapy: Secondary | ICD-10-CM | POA: Diagnosis not present

## 2023-11-15 DIAGNOSIS — L282 Other prurigo: Secondary | ICD-10-CM | POA: Diagnosis not present

## 2023-11-15 DIAGNOSIS — R7303 Prediabetes: Secondary | ICD-10-CM | POA: Diagnosis not present

## 2023-11-15 DIAGNOSIS — R002 Palpitations: Secondary | ICD-10-CM | POA: Diagnosis not present

## 2023-11-15 DIAGNOSIS — N393 Stress incontinence (female) (male): Secondary | ICD-10-CM | POA: Diagnosis not present

## 2023-11-16 DIAGNOSIS — I493 Ventricular premature depolarization: Secondary | ICD-10-CM | POA: Diagnosis not present

## 2023-11-16 DIAGNOSIS — I480 Paroxysmal atrial fibrillation: Secondary | ICD-10-CM | POA: Diagnosis not present

## 2023-11-16 DIAGNOSIS — Z7901 Long term (current) use of anticoagulants: Secondary | ICD-10-CM | POA: Diagnosis not present

## 2023-11-16 DIAGNOSIS — Z8679 Personal history of other diseases of the circulatory system: Secondary | ICD-10-CM | POA: Diagnosis not present

## 2023-11-16 DIAGNOSIS — Z9889 Other specified postprocedural states: Secondary | ICD-10-CM | POA: Diagnosis not present

## 2023-11-19 DIAGNOSIS — L309 Dermatitis, unspecified: Secondary | ICD-10-CM | POA: Diagnosis not present

## 2023-11-20 ENCOUNTER — Other Ambulatory Visit: Payer: Self-pay | Admitting: Cardiology

## 2023-11-20 DIAGNOSIS — I480 Paroxysmal atrial fibrillation: Secondary | ICD-10-CM

## 2023-11-20 NOTE — Progress Notes (Signed)
 ECHO ordered at request of [facility]. She wanted to have ECHO done here.  Oneil Parchment, MD

## 2023-11-28 DIAGNOSIS — Z1231 Encounter for screening mammogram for malignant neoplasm of breast: Secondary | ICD-10-CM | POA: Diagnosis not present

## 2023-12-03 DIAGNOSIS — E782 Mixed hyperlipidemia: Secondary | ICD-10-CM | POA: Diagnosis not present

## 2023-12-03 DIAGNOSIS — R7303 Prediabetes: Secondary | ICD-10-CM | POA: Diagnosis not present

## 2023-12-03 DIAGNOSIS — Z6831 Body mass index (BMI) 31.0-31.9, adult: Secondary | ICD-10-CM | POA: Diagnosis not present

## 2023-12-03 DIAGNOSIS — I2510 Atherosclerotic heart disease of native coronary artery without angina pectoris: Secondary | ICD-10-CM | POA: Diagnosis not present

## 2023-12-03 DIAGNOSIS — E669 Obesity, unspecified: Secondary | ICD-10-CM | POA: Diagnosis not present

## 2023-12-03 DIAGNOSIS — E559 Vitamin D deficiency, unspecified: Secondary | ICD-10-CM | POA: Diagnosis not present

## 2023-12-03 DIAGNOSIS — G4730 Sleep apnea, unspecified: Secondary | ICD-10-CM | POA: Diagnosis not present

## 2023-12-14 ENCOUNTER — Ambulatory Visit (HOSPITAL_COMMUNITY): Admission: RE | Admit: 2023-12-14 | Discharge: 2023-12-14 | Attending: Cardiology | Admitting: Cardiology

## 2023-12-14 DIAGNOSIS — I480 Paroxysmal atrial fibrillation: Secondary | ICD-10-CM | POA: Diagnosis not present

## 2023-12-14 DIAGNOSIS — R9431 Abnormal electrocardiogram [ECG] [EKG]: Secondary | ICD-10-CM | POA: Diagnosis not present

## 2023-12-14 DIAGNOSIS — I10 Essential (primary) hypertension: Secondary | ICD-10-CM | POA: Diagnosis not present

## 2023-12-14 LAB — ECHOCARDIOGRAM COMPLETE
Area-P 1/2: 3.91 cm2
S' Lateral: 2.8 cm

## 2023-12-16 ENCOUNTER — Ambulatory Visit: Payer: Self-pay | Admitting: Cardiology

## 2023-12-27 ENCOUNTER — Ambulatory Visit (HOSPITAL_COMMUNITY)

## 2024-02-18 ENCOUNTER — Ambulatory Visit: Admitting: Physical Therapy
